# Patient Record
Sex: Female | Born: 1950 | Race: White | Hispanic: No | Marital: Married | Smoking: Never smoker
Health system: Southern US, Community
[De-identification: ages and names within clinical notes are randomized; demographics above are authoritative.]

## PROBLEM LIST (undated history)

## (undated) DIAGNOSIS — R87619 Unspecified abnormal cytological findings in specimens from cervix uteri: Secondary | ICD-10-CM

## (undated) DIAGNOSIS — K922 Gastrointestinal hemorrhage, unspecified: Secondary | ICD-10-CM

## (undated) DIAGNOSIS — I10 Essential (primary) hypertension: Secondary | ICD-10-CM

## (undated) DIAGNOSIS — R011 Cardiac murmur, unspecified: Secondary | ICD-10-CM

## (undated) DIAGNOSIS — R7303 Prediabetes: Secondary | ICD-10-CM

## (undated) DIAGNOSIS — J302 Other seasonal allergic rhinitis: Secondary | ICD-10-CM

## (undated) DIAGNOSIS — E559 Vitamin D deficiency, unspecified: Secondary | ICD-10-CM

## (undated) DIAGNOSIS — T7840XA Allergy, unspecified, initial encounter: Secondary | ICD-10-CM

## (undated) DIAGNOSIS — E785 Hyperlipidemia, unspecified: Secondary | ICD-10-CM

## (undated) HISTORY — DX: Unspecified abnormal cytological findings in specimens from cervix uteri: R87.619

## (undated) HISTORY — DX: Cardiac murmur, unspecified: R01.1

## (undated) HISTORY — DX: Other seasonal allergic rhinitis: J30.2

## (undated) HISTORY — PX: CRYOTHERAPY: SHX1416

## (undated) HISTORY — DX: Essential (primary) hypertension: I10

## (undated) HISTORY — DX: Gastrointestinal hemorrhage, unspecified: K92.2

## (undated) HISTORY — DX: Allergy, unspecified, initial encounter: T78.40XA

## (undated) HISTORY — DX: Vitamin D deficiency, unspecified: E55.9

## (undated) HISTORY — DX: Prediabetes: R73.03

## (undated) HISTORY — PX: TUBAL LIGATION: SHX77

## (undated) HISTORY — PX: CERVIX LESION DESTRUCTION: SHX591

## (undated) HISTORY — PX: COLONOSCOPY: SHX174

## (undated) HISTORY — PX: JOINT REPLACEMENT: SHX530

## (undated) HISTORY — DX: Hyperlipidemia, unspecified: E78.5

---
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
## 1997-12-03 ENCOUNTER — Other Ambulatory Visit: Admission: RE | Admit: 1997-12-03 | Discharge: 1997-12-03 | Payer: Self-pay | Admitting: Gynecology

## 1998-12-08 ENCOUNTER — Other Ambulatory Visit: Admission: RE | Admit: 1998-12-08 | Discharge: 1998-12-08 | Payer: Self-pay | Admitting: Gynecology

## 2000-01-25 ENCOUNTER — Other Ambulatory Visit: Admission: RE | Admit: 2000-01-25 | Discharge: 2000-01-25 | Payer: Self-pay | Admitting: Gynecology

## 2001-04-13 ENCOUNTER — Encounter: Admission: RE | Admit: 2001-04-13 | Discharge: 2001-04-13 | Payer: Self-pay | Admitting: Obstetrics and Gynecology

## 2001-04-13 ENCOUNTER — Other Ambulatory Visit: Admission: RE | Admit: 2001-04-13 | Discharge: 2001-04-13 | Payer: Self-pay | Admitting: Obstetrics and Gynecology

## 2001-04-13 ENCOUNTER — Encounter: Payer: Self-pay | Admitting: Obstetrics and Gynecology

## 2002-04-19 ENCOUNTER — Encounter: Payer: Self-pay | Admitting: Obstetrics and Gynecology

## 2002-04-19 ENCOUNTER — Other Ambulatory Visit: Admission: RE | Admit: 2002-04-19 | Discharge: 2002-04-19 | Payer: Self-pay | Admitting: Obstetrics and Gynecology

## 2002-04-19 ENCOUNTER — Ambulatory Visit (HOSPITAL_COMMUNITY): Admission: RE | Admit: 2002-04-19 | Discharge: 2002-04-19 | Payer: Self-pay | Admitting: Obstetrics and Gynecology

## 2002-07-09 ENCOUNTER — Encounter (HOSPITAL_COMMUNITY): Admission: RE | Admit: 2002-07-09 | Discharge: 2002-10-07 | Payer: Self-pay | Admitting: Family Medicine

## 2002-07-10 ENCOUNTER — Encounter: Payer: Self-pay | Admitting: Family Medicine

## 2003-05-06 ENCOUNTER — Other Ambulatory Visit: Admission: RE | Admit: 2003-05-06 | Discharge: 2003-05-06 | Payer: Self-pay | Admitting: Obstetrics and Gynecology

## 2003-05-06 ENCOUNTER — Ambulatory Visit (HOSPITAL_COMMUNITY): Admission: RE | Admit: 2003-05-06 | Discharge: 2003-05-06 | Payer: Self-pay | Admitting: Obstetrics and Gynecology

## 2004-06-03 ENCOUNTER — Ambulatory Visit (HOSPITAL_COMMUNITY): Admission: RE | Admit: 2004-06-03 | Discharge: 2004-06-03 | Payer: Self-pay | Admitting: Obstetrics and Gynecology

## 2004-06-03 ENCOUNTER — Other Ambulatory Visit: Admission: RE | Admit: 2004-06-03 | Discharge: 2004-06-03 | Payer: Self-pay | Admitting: *Deleted

## 2005-09-03 ENCOUNTER — Other Ambulatory Visit: Admission: RE | Admit: 2005-09-03 | Discharge: 2005-09-03 | Payer: Self-pay | Admitting: Obstetrics and Gynecology

## 2005-09-03 ENCOUNTER — Ambulatory Visit (HOSPITAL_COMMUNITY): Admission: RE | Admit: 2005-09-03 | Discharge: 2005-09-03 | Payer: Self-pay | Admitting: Obstetrics and Gynecology

## 2005-10-12 ENCOUNTER — Ambulatory Visit: Payer: Self-pay | Admitting: Gastroenterology

## 2005-11-04 ENCOUNTER — Encounter (INDEPENDENT_AMBULATORY_CARE_PROVIDER_SITE_OTHER): Payer: Self-pay | Admitting: Specialist

## 2005-11-04 ENCOUNTER — Ambulatory Visit: Payer: Self-pay | Admitting: Gastroenterology

## 2006-10-12 ENCOUNTER — Other Ambulatory Visit: Admission: RE | Admit: 2006-10-12 | Discharge: 2006-10-12 | Payer: Self-pay | Admitting: Obstetrics and Gynecology

## 2007-10-12 ENCOUNTER — Encounter: Admission: RE | Admit: 2007-10-12 | Discharge: 2007-10-12 | Payer: Self-pay | Admitting: Obstetrics and Gynecology

## 2007-10-13 ENCOUNTER — Other Ambulatory Visit: Admission: RE | Admit: 2007-10-13 | Discharge: 2007-10-13 | Payer: Self-pay | Admitting: Obstetrics and Gynecology

## 2007-10-13 ENCOUNTER — Encounter: Payer: Self-pay | Admitting: Obstetrics and Gynecology

## 2009-08-01 ENCOUNTER — Encounter: Admission: RE | Admit: 2009-08-01 | Discharge: 2009-08-01 | Payer: Self-pay | Admitting: Internal Medicine

## 2010-02-09 ENCOUNTER — Encounter: Payer: Self-pay | Admitting: Obstetrics and Gynecology

## 2010-09-30 ENCOUNTER — Other Ambulatory Visit: Payer: Self-pay | Admitting: Obstetrics and Gynecology

## 2010-09-30 ENCOUNTER — Other Ambulatory Visit: Payer: Self-pay

## 2010-09-30 DIAGNOSIS — Z1231 Encounter for screening mammogram for malignant neoplasm of breast: Secondary | ICD-10-CM

## 2010-10-14 ENCOUNTER — Ambulatory Visit
Admission: RE | Admit: 2010-10-14 | Discharge: 2010-10-14 | Disposition: A | Discharge status: Home / Self Care | Payer: BC Managed Care – PPO | Source: Ambulatory Visit | Attending: Obstetrics and Gynecology | Admitting: Obstetrics and Gynecology

## 2010-10-14 DIAGNOSIS — Z1231 Encounter for screening mammogram for malignant neoplasm of breast: Secondary | ICD-10-CM

## 2010-12-09 ENCOUNTER — Encounter: Payer: Self-pay | Admitting: Internal Medicine

## 2010-12-24 ENCOUNTER — Encounter: Payer: Self-pay | Admitting: Internal Medicine

## 2011-01-20 ENCOUNTER — Encounter: Payer: BC Managed Care – PPO | Admitting: Internal Medicine

## 2011-02-15 ENCOUNTER — Other Ambulatory Visit: Payer: BC Managed Care – PPO | Admitting: Internal Medicine

## 2011-03-09 ENCOUNTER — Encounter: Payer: Self-pay | Admitting: Internal Medicine

## 2011-03-09 ENCOUNTER — Ambulatory Visit (AMBULATORY_SURGERY_CENTER): Payer: BC Managed Care – PPO | Admitting: *Deleted

## 2011-03-09 VITALS — Ht 68.0 in | Wt 152.3 lb

## 2011-03-09 DIAGNOSIS — Z1211 Encounter for screening for malignant neoplasm of colon: Secondary | ICD-10-CM

## 2011-03-09 MED ORDER — PEG-KCL-NACL-NASULF-NA ASC-C 100 G PO SOLR: ORAL | Status: DC

## 2011-03-23 ENCOUNTER — Encounter: Payer: Self-pay | Admitting: Internal Medicine

## 2011-03-23 ENCOUNTER — Ambulatory Visit (AMBULATORY_SURGERY_CENTER): Payer: BC Managed Care – PPO | Admitting: Internal Medicine

## 2011-03-23 VITALS — BP 137/70 | HR 56 | Temp 97.8°F | Resp 14 | Ht 68.0 in | Wt 152.0 lb

## 2011-03-23 DIAGNOSIS — K648 Other hemorrhoids: Secondary | ICD-10-CM

## 2011-03-23 DIAGNOSIS — Z1211 Encounter for screening for malignant neoplasm of colon: Secondary | ICD-10-CM

## 2011-03-23 DIAGNOSIS — Z86010 Personal history of colonic polyps: Secondary | ICD-10-CM

## 2011-03-23 DIAGNOSIS — D126 Benign neoplasm of colon, unspecified: Secondary | ICD-10-CM

## 2011-03-23 DIAGNOSIS — D129 Benign neoplasm of anus and anal canal: Secondary | ICD-10-CM

## 2011-03-23 DIAGNOSIS — D128 Benign neoplasm of rectum: Secondary | ICD-10-CM

## 2011-03-23 LAB — HM COLONOSCOPY

## 2011-03-23 MED ORDER — SODIUM CHLORIDE 0.9 % IV SOLN: 500.0000 mL | INTRAVENOUS | Status: DC

## 2011-03-23 NOTE — Progress Notes (Signed)
IV AT FLUSH RATE, SKIN WARN DRY PINK, RESP DEEP EVEN.

## 2011-03-23 NOTE — Progress Notes (Signed)
Patient did not have preoperative order for IV antibiotic SSI prophylaxis. (G8918)  Patient did not experience any of the following events: a burn prior to discharge; a fall within the facility; wrong site/side/patient/procedure/implant event; or a hospital transfer or hospital admission upon discharge from the facility. (G8907)  

## 2011-03-23 NOTE — Op Note (Signed)
[facility] [address] [place], [place]  [address]  COLONOSCOPY PROCEDURE REPORT  PATIENT:  [name], [name]  MR#:  [id number] BIRTHDATE:  1950-03-21, 60 yrs. old  GENDER:  female ENDOSCOPIST:  Iva Boop, MD, [facility]  PROCEDURE DATE:  03/23/2011 PROCEDURE:  Colonoscopy with snare polypectomy ASA CLASS:  Class II INDICATIONS:  surveillance and high-risk screening, history of pre-cancerous (adenomatous) colon polyps, family history of colon cancer (sister age 55) diminutive adenoma removed from right colon 2004 and 7 mm right colon hyperplastic polyp removed 2007 MEDICATIONS:   These medications were titrated to patient response per physician's verbal order, Fentanyl 75 mcg IV, Versed 6 mg IV  DESCRIPTION OF PROCEDURE:   After the risks benefits and alternatives of the procedure were thoroughly explained, informed consent was obtained.  Digital rectal exam was performed and revealed no abnormalities.   The LB [id number] endoscope was introduced through the anus and advanced to the cecum, which was identified by both the appendix and ileocecal valve, without limitations.  The quality of the prep was excellent, using MoviPrep.  The instrument was then slowly withdrawn as the colon was fully examined. <<PROCEDUREIMAGES>>  FINDINGS:  Two polyps were found. Diminutive cecal and rectal polyps (3-51mm) cold snared and sent to pathology.  Mild diverticulosis was found in the sigmoid colon.  This was otherwise a normal examination of the colon.   Retroflexed views in the rectum revealed internal hemorrhoids.    The time to cecum = 5:27 minutes. The scope was then withdrawn in 11:38 minutes from the cecum and the procedure completed. COMPLICATIONS:  None ENDOSCOPIC IMPRESSION: 1) Two diminutive polyps removed 2) Mild diverticulosis in the sigmoid colon 3) Internal hemorrhoids 4) Otherwise normal examination, excellent prep 5) personal history of polyps and a  family history of colon cancer   REPEAT EXAM:  In for Colonoscopy, pending biopsy results. Likely repeat in 5 years.  Iva Boop, MD, [facility]  CC:  The Patient and Lucky Cowboy, MD  n. Rosalie Doctor:   Iva Boop at 03/23/2011 11:31 AM  Vickii Chafe, [phone]

## 2011-03-23 NOTE — Patient Instructions (Signed)
YOU HAD AN ENDOSCOPIC PROCEDURE TODAY AT THE [facility]: Refer to the procedure report that was given to you for any specific questions about what was found during the examination.  If the procedure report does not answer your questions, please call your gastroenterologist to clarify.  If you requested that your care partner not be given the details of your procedure findings, then the procedure report has been included in a sealed envelope for you to review at your convenience later.  YOU SHOULD EXPECT: Some feelings of bloating in the abdomen. Passage of more gas than usual.  Walking can help get rid of the air that was put into your GI tract during the procedure and reduce the bloating. If you had a lower endoscopy (such as a colonoscopy or flexible sigmoidoscopy) you may notice spotting of blood in your stool or on the toilet paper. If you underwent a bowel prep for your procedure, then you may not have a normal bowel movement for a few days.  DIET: Your first meal following the procedure should be a light meal and then it is ok to progress to your normal diet.  A half-sandwich or bowl of soup is an example of a good first meal.  Heavy or fried foods are harder to digest and may make you feel nauseous or bloated.  Likewise meals heavy in dairy and vegetables can cause extra gas to form and this can also increase the bloating.  Drink plenty of fluids but you should avoid alcoholic beverages for 24 hours.  ACTIVITY: Your care partner should take you home directly after the procedure.  You should plan to take it easy, moving slowly for the rest of the day.  You can resume normal activity the day after the procedure however you should NOT DRIVE or use heavy machinery for 24 hours (because of the sedation medicines used during the test).    SYMPTOMS TO REPORT IMMEDIATELY: A gastroenterologist can be reached at any hour.  During normal business hours, 8:30 AM to 5:00 PM Monday through Friday,  call [phone].  After hours and on weekends, please call the GI answering service at [phone] who will take a message and have the physician on call contact you.   Following lower endoscopy (colonoscopy or flexible sigmoidoscopy):  Excessive amounts of blood in the stool  Significant tenderness or worsening of abdominal pains  Swelling of the abdomen that is new, acute  Fever of 100F or higher    FOLLOW UP: If any biopsies were taken you will be contacted by phone or by letter within the next 1-3 weeks.  Call your gastroenterologist if you have not heard about the biopsies in 3 weeks.  Our staff will call the home number listed on your records the next business day following your procedure to check on you and address any questions or concerns that you may have at that time regarding the information given to you following your procedure. This is a courtesy call and so if there is no answer at the home number and we have not heard from you through the emergency physician on call, we will assume that you have returned to your regular daily activities without incident.  SIGNATURES/CONFIDENTIALITY: You and/or your care partner have signed paperwork which will be entered into your electronic medical record.  These signatures attest to the fact that that the information above on your After Visit Summary has been reviewed and is understood.  Full responsibility of the confidentiality   of this discharge information lies with you and/or your care-partner.     

## 2011-03-24 ENCOUNTER — Telehealth: Payer: Self-pay | Admitting: *Deleted

## 2011-03-24 NOTE — Telephone Encounter (Signed)
  Follow up Call-  Call back number 03/23/2011  Post procedure Call Back phone  # [phone]  Permission to leave phone message Yes     No answer, message left.

## 2011-03-30 ENCOUNTER — Encounter: Payer: Self-pay | Admitting: Internal Medicine

## 2011-03-30 NOTE — Progress Notes (Signed)
Quick Note:  Serrated adenoma and tubular adenoma and fam hx crca Repeat colonoscopy 3 years - approx 03/2014 ______

## 2011-10-14 ENCOUNTER — Other Ambulatory Visit: Payer: Self-pay | Admitting: Obstetrics and Gynecology

## 2011-10-14 DIAGNOSIS — Z1231 Encounter for screening mammogram for malignant neoplasm of breast: Secondary | ICD-10-CM

## 2011-11-16 ENCOUNTER — Ambulatory Visit
Admission: RE | Admit: 2011-11-16 | Discharge: 2011-11-16 | Disposition: A | Discharge status: Home / Self Care | Payer: BC Managed Care – PPO | Source: Ambulatory Visit | Attending: Obstetrics and Gynecology | Admitting: Obstetrics and Gynecology

## 2011-11-16 DIAGNOSIS — Z1231 Encounter for screening mammogram for malignant neoplasm of breast: Secondary | ICD-10-CM

## 2012-10-25 ENCOUNTER — Other Ambulatory Visit: Payer: Self-pay

## 2012-10-25 DIAGNOSIS — Z1231 Encounter for screening mammogram for malignant neoplasm of breast: Secondary | ICD-10-CM

## 2012-11-09 ENCOUNTER — Encounter: Payer: Self-pay | Admitting: Certified Nurse Midwife

## 2012-11-16 ENCOUNTER — Ambulatory Visit (INDEPENDENT_AMBULATORY_CARE_PROVIDER_SITE_OTHER): Payer: BC Managed Care – PPO | Admitting: Certified Nurse Midwife

## 2012-11-16 ENCOUNTER — Encounter: Payer: Self-pay | Admitting: Certified Nurse Midwife

## 2012-11-16 VITALS — BP 112/70 | HR 64 | Resp 18 | Ht 68.75 in | Wt 147.0 lb

## 2012-11-16 DIAGNOSIS — Z01419 Encounter for gynecological examination (general) (routine) without abnormal findings: Secondary | ICD-10-CM

## 2012-11-16 NOTE — Progress Notes (Signed)
62 y.o. G6P2002 Married Caucasian Fe here for annual exam. Menopausal no HRT. Patient denies vaginal bleeding or dryness. Patient sees PCP for aex and hypertension management, stable with medication.    Patient's last menstrual period was 11/17/2002.          Sexually active: yes  The current method of family planning is status post hysterectomy.    Exercising: yes  walk 3x/wk Smoker:  no  Health Maintenance: Pap:  11/16/11 NEG HR HPV MMG:  10/13 Scheduled for 11/20/12 Colonoscopy:  2011 5 years BMD:   none TDaP:  07/10/12 Labs: PCP   reports that she has never smoked. She has never used smokeless tobacco. She reports that she drinks about 1.5 ounces of alcohol per week. She reports that she does not use illicit drugs.  Past Medical History  Diagnosis Date  . Seasonal allergies   . Heart murmur   . Hyperlipidemia     Past Surgical History  Procedure Laterality Date  . Cardiac surgery  1958    at age 45 for hole in heart  . Colonoscopy      polyps    Current Outpatient Prescriptions  Medication Sig Dispense Refill  . acetaminophen (TYLENOL) 500 MG tablet Take 500 mg by mouth as needed for pain.      Marland Kitchen aspirin 81 MG tablet Take 81 mg by mouth daily.      . bisoprolol-hydrochlorothiazide (ZIAC) 5-6.25 MG per tablet Take 1 tablet by mouth daily.      . Calcium Carbonate-Vit D-Min (CALCIUM 1200 PO) Take by mouth daily.      . Cholecalciferol (VITAMIN D PO) Take 1,200 Int'l Units by mouth daily.      . fish oil-omega-3 fatty acids 1000 MG capsule Take 1 g by mouth 3 (three) times daily.      . Flaxseed, Linseed, (FLAXSEED OIL PO) Take by mouth 3 (three) times daily.      . Multiple Vitamin (MULTIVITAMIN) tablet Take 1 tablet by mouth daily.       No current facility-administered medications for this visit.    Family History  Problem Relation Age of Onset  . Colon cancer Sister 17  . Diabetes Sister   . Stomach cancer Neg Hx   . Diabetes Father   . Diabetes Brother   .  Diabetes Sister     ROS:  Pertinent items are noted in HPI.  Otherwise, a comprehensive ROS was negative.  Exam:   BP 112/70  Pulse 64  Resp 18  Ht 5' 8.75" (1.746 m)  Wt 147 lb (66.679 kg)  BMI 21.87 kg/m2  LMP 11/17/2002 Height: 5' 8.75" (174.6 cm)  Ht Readings from Last 3 Encounters:  11/16/12 5' 8.75" (1.746 m)  03/23/11 5\' 8"  (1.727 m)  03/09/11 5\' 8"  (1.727 m)    General appearance: alert, cooperative and appears stated age Head: Normocephalic, without obvious abnormality, atraumatic Neck: no adenopathy, supple, symmetrical, trachea midline and thyroid normal to inspection and palpation Lungs: clear to auscultation bilaterally Breasts: normal appearance, no masses or tenderness, No nipple retraction or dimpling, No nipple discharge or bleeding, No axillary or supraclavicular adenopathy Heart: regular rate and rhythm Abdomen: soft, non-tender; no masses,  no organomegaly Extremities: extremities normal, atraumatic, no cyanosis or edema Skin: Skin color, texture, turgor normal. No rashes or lesions Lymph nodes: Cervical, supraclavicular, and axillary nodes normal. No abnormal inguinal nodes palpated Neurologic: Grossly normal   Pelvic: External genitalia:  no lesions  Urethra:  normal appearing urethra with no masses, tenderness or lesions              Bartholin's and Skene's: normal                 Vagina: normal appearing vagina with normal color and discharge, no lesions              Cervix: normal, non tender              Pap taken: no Bimanual Exam:  Uterus:  normal size, contour, position, consistency, mobility, non-tender and anteverted              Adnexa: normal adnexa and no mass, fullness, tenderness               Rectovaginal: Confirms               Anus:  normal sphincter tone, no lesions  A:  Well Woman with normal exam  Menopausal no HRT  Hypertension stable medication  P:   Reviewed health and wellness pertinent  Patient aware of need  to evaluate if vaginal bleeding  Continue follow up as indicated  Pap smear as per guidelines   Mammogram yearly pap smear not taken today  counseled on breast self exam, mammography screening, adequate intake of calcium and vitamin D, diet and exercise  return annually or prn  An After Visit Summary was printed and given to the patient.

## 2012-11-16 NOTE — Patient Instructions (Signed)

## 2012-11-17 NOTE — Progress Notes (Signed)
Note reviewed, agree with plan.  Marni Franzoni, MD  

## 2012-11-20 ENCOUNTER — Ambulatory Visit
Admission: RE | Admit: 2012-11-20 | Discharge: 2012-11-20 | Disposition: A | Discharge status: Home / Self Care | Payer: BC Managed Care – PPO | Source: Ambulatory Visit

## 2012-11-20 DIAGNOSIS — Z1231 Encounter for screening mammogram for malignant neoplasm of breast: Secondary | ICD-10-CM

## 2012-11-20 LAB — HM MAMMOGRAPHY: HM Mammogram: NORMAL

## 2013-01-02 ENCOUNTER — Encounter: Payer: Self-pay | Admitting: Internal Medicine

## 2013-01-02 DIAGNOSIS — E782 Mixed hyperlipidemia: Secondary | ICD-10-CM | POA: Insufficient documentation

## 2013-01-02 DIAGNOSIS — R7303 Prediabetes: Secondary | ICD-10-CM | POA: Insufficient documentation

## 2013-01-02 DIAGNOSIS — E559 Vitamin D deficiency, unspecified: Secondary | ICD-10-CM | POA: Insufficient documentation

## 2013-01-02 DIAGNOSIS — I10 Essential (primary) hypertension: Secondary | ICD-10-CM | POA: Insufficient documentation

## 2013-01-02 DIAGNOSIS — J302 Other seasonal allergic rhinitis: Secondary | ICD-10-CM | POA: Insufficient documentation

## 2013-01-02 NOTE — Patient Instructions (Signed)

## 2013-01-02 NOTE — Progress Notes (Signed)
Patient ID: [name] [name], female   DOB: 1950-03-18, 62 y.o.   MRN: [id number]   This very nice 62 yo MWF presents for complete physical.  Patient has been followed for HTN, Prediabetes, Hyperlipidemia, and Vitamin D Deficiency.   Patient's BP has been controlled at home. Today's BP is 106/60. Patient denies any cardiac symptoms as chest pain, palpitations, shortness of breath, dizziness or ankle swelling.   Patient's hyperlipidemia is controlled with diet and medications. Patient denies myalgias or other medication SE's. Last cholesterol last visit was 171, triglycerides 148, HDL 54 and LDL  91 in September - all at goal.     Patient has prediabetes/insulin resistance with last A1c  5.7% in June (was 6.0% in 2010). Patient denies reactive hypoglycemic symptoms, visual blurring, diabetic polys, or paresthesias.     Finally, patient has history of Vitamin D Deficiency with last vitamin D 84 (was 24 in 2008)     Medication Sig Dispense Refill  . acetaminophen (TYLENOL) 500 MG tablet Take 500 mg by mouth as needed for pain.      Marland Kitchen aspirin 81 MG tablet Take 81 mg by mouth every other day.       . bisoprolol-hydrochlorothiazide (ZIAC) 5-6.25 MG per tablet Take 1 tablet by mouth daily.      . fish oil-omega-3 fatty acids 1000 MG capsule Take 1 g by mouth 3 (three) times daily.      . Flaxseed, Linseed, (FLAXSEED OIL PO) Take by mouth 3 (three) times daily.      . Multiple Vitamin (MULTIVITAMIN) tablet Take 1 tablet by mouth daily.        Allergies  Allergen Reactions  . Amoxil [Amoxicillin] Rash  . Ampicillin Rash    Past Medical History  Diagnosis Date  . Heart murmur   . Hyperlipidemia   . Seasonal allergies   . Vitamin D deficiency   . Prediabetes   . Hypertension     Past Surgical History  Procedure Laterality Date  . Cardiac surgery  1958    at age 58 for hole in heart  . Colonoscopy      polyps    Family History  Problem Relation Age of Onset  . Colon cancer Sister 66   . Diabetes Sister   . Stomach cancer Neg Hx   . Diabetes Father   . Diabetes Brother   . Diabetes Sister     History  Substance Use Topics  . Smoking status: Never Smoker   . Smokeless tobacco: Never Used  . Alcohol Use: 1.5 oz/week    3 drink(s) per week     Comment: occasional    ROS Constitutional: Denies fever, chills, weight loss/gain, headaches, insomnia, fatigue, night sweats, and change in appetite. Eyes: Denies redness, blurred vision, diplopia, discharge, itchy, watery eyes.  ENT: Denies discharge, congestion, post nasal drip, epistaxis, sore throat, earache, hearing loss, dental pain, Tinnitus, Vertigo, Sinus pain, snoring.  Cardio: Denies chest pain, palpitations, irregular heartbeat, syncope, dyspnea, diaphoresis, orthopnea, PND, claudication, edema Respiratory: denies cough, dyspnea, DOE, pleurisy, hoarseness, laryngitis, wheezing.  Gastrointestinal: Denies dysphagia, heartburn, reflux, water brash, pain, cramps, nausea, vomiting, bloating, diarrhea, constipation, hematemesis, melena, hematochezia, jaundice, hemorrhoids Genitourinary: Denies dysuria, frequency, urgency, nocturia, hesitancy, discharge, hematuria, flank pain Breast:Breast lumps, nipple discharge, bleeding.  Musculoskeletal: Denies arthralgia, myalgia, stiffness, Jt. Swelling, pain, limp, and strain/sprain. Skin: Denies puritis, rash, hives, warts, acne, eczema, changing in skin lesion Neuro: No weakness, tremor, incoordination, spasms, paresthesia, pain Psychiatric: Denies confusion, memory loss, sensory loss  Endocrine: Denies change in weight, skin, hair change, nocturia, and paresthesia, diabetic polys, visual blurring, hyper / hypo glycemic episodes.  Heme/Lymph: No excessive bleeding, bruising, enlarged lymph nodes.  BP: 106/60  Pulse: 52  Temp: 98.1 F (36.7 C)  Resp: 16    Estimated body mass index is 21.58 kg/(m^2) as calculated from the following:   Height as of 11/16/12: 5' 8.75" (1.746  m).   Weight as of this encounter: 145 lb (65.772 kg).  Physical Exam General Appearance: Well nourished, in no apparent distress. Eyes: PERRLA, EOMs, conjunctiva no swelling or erythema, normal fundi and vessels. Sinuses: No frontal/maxillary tenderness ENT/Mouth: EACs patent / TMs  nl. Nares clear without erythema, swelling, mucoid exudates. Oral hygiene is good. No erythema, swelling, or exudate. Tongue normal, non-obstructing. Tonsils not swollen or erythematous. Hearing normal.  Neck: Supple, thyroid normal. No bruits, nodes or JVD. Respiratory: Respiratory effort normal.  BS equal and clear bilateral without rales, rhonci, wheezing or stridor. Cardio: Heart sounds are normal with regular rate and rhythm and no murmurs, rubs or gallops. Peripheral pulses are normal and equal bilaterally without edema. No aortic or femoral bruits. Chest: symmetric with normal excursions and percussion. Abdomen: Flat, soft, with bowl sounds. Nontender, no guarding, rebound, hernias, masses, or organomegaly.  Lymphatics: Non tender without lymphadenopathy.   Musculoskeletal: Full ROM all peripheral extremities, joint stability, 5/5 strength, and normal gait. Skin: Warm and dry without rashes, lesions, cyanosis, clubbing or  ecchymosis.  Neuro: Cranial nerves intact, reflexes equal bilaterally. Normal muscle tone, no cerebellar symptoms. Sensation intact.  Pysch: Awake and oriented X 3, normal affect, Insight and Judgment appropriate.   Assessment and Plan  1. Hypertension  2. Hyperlipidemia 3. Pre Diabetes 4. Vitamin D Deficiency  Continue prudent diet as discussed, weight control, BP monitoring, regular exercise, and medications. Discussed med's effects and SE's. Screening labs and tests as requested with regular follow-up as recommended.

## 2013-01-03 ENCOUNTER — Encounter: Payer: Self-pay | Admitting: Internal Medicine

## 2013-01-03 ENCOUNTER — Other Ambulatory Visit: Payer: Self-pay | Admitting: Internal Medicine

## 2013-01-03 ENCOUNTER — Ambulatory Visit (INDEPENDENT_AMBULATORY_CARE_PROVIDER_SITE_OTHER): Payer: BC Managed Care – PPO | Admitting: Internal Medicine

## 2013-01-03 VITALS — BP 106/60 | HR 52 | Temp 98.1°F | Resp 16 | Wt 145.0 lb

## 2013-01-03 DIAGNOSIS — Z79899 Other long term (current) drug therapy: Secondary | ICD-10-CM

## 2013-01-03 DIAGNOSIS — E559 Vitamin D deficiency, unspecified: Secondary | ICD-10-CM

## 2013-01-03 DIAGNOSIS — E782 Mixed hyperlipidemia: Secondary | ICD-10-CM

## 2013-01-03 DIAGNOSIS — I10 Essential (primary) hypertension: Secondary | ICD-10-CM

## 2013-01-03 DIAGNOSIS — R7309 Other abnormal glucose: Secondary | ICD-10-CM

## 2013-01-03 DIAGNOSIS — Z23 Encounter for immunization: Secondary | ICD-10-CM

## 2013-01-03 LAB — CBC WITH DIFFERENTIAL/PLATELET
Basophils Absolute: 0 10*3/uL (ref 0.0–0.1)
Basophils Relative: 0 % (ref 0–1)
Eosinophils Absolute: 0.1 10*3/uL (ref 0.0–0.7)
Eosinophils Relative: 2 % (ref 0–5)
HCT: 38.2 % (ref 36.0–46.0)
Hemoglobin: 13.1 g/dL (ref 12.0–15.0)
Lymphocytes Relative: 40 % (ref 12–46)
Lymphs Abs: 1.9 10*3/uL (ref 0.7–4.0)
MCH: 30 pg (ref 26.0–34.0)
MCHC: 34.3 g/dL (ref 30.0–36.0)
MCV: 87.6 fL (ref 78.0–100.0)
Monocytes Absolute: 0.3 10*3/uL (ref 0.1–1.0)
Monocytes Relative: 7 % (ref 3–12)
Neutro Abs: 2.4 10*3/uL (ref 1.7–7.7)
Neutrophils Relative %: 51 % (ref 43–77)
Platelets: 269 10*3/uL (ref 150–400)
RBC: 4.36 MIL/uL (ref 3.87–5.11)
RDW: 13.1 % (ref 11.5–15.5)
WBC: 4.6 10*3/uL (ref 4.0–10.5)

## 2013-01-03 LAB — HEMOGLOBIN A1C
Hgb A1c MFr Bld: 5.9 % — ABNORMAL HIGH (ref <5.7)
Mean Plasma Glucose: 123 mg/dL — ABNORMAL HIGH (ref <117)

## 2013-01-04 LAB — BASIC METABOLIC PANEL WITH GFR
BUN: 16 mg/dL (ref 6–23)
CO2: 30 mEq/L (ref 19–32)
Calcium: 9.6 mg/dL (ref 8.4–10.5)
Chloride: 102 mEq/L (ref 96–112)
Creat: 0.62 mg/dL (ref 0.50–1.10)
GFR, Est African American: >89 mL/min
GFR, Est Non African American: >89 mL/min
Glucose, Bld: 92 mg/dL (ref 70–99)
Potassium: 4.2 mEq/L (ref 3.5–5.3)
Sodium: 139 mEq/L (ref 135–145)

## 2013-01-04 LAB — HEPATIC FUNCTION PANEL
ALT: 16 U/L (ref 0–35)
AST: 18 U/L (ref 0–37)
Albumin: 4.5 g/dL (ref 3.5–5.2)
Alkaline Phosphatase: 38 U/L — ABNORMAL LOW (ref 39–117)
Bilirubin, Direct: 0.2 mg/dL (ref 0.0–0.3)
Indirect Bilirubin: 0.6 mg/dL (ref 0.0–0.9)
Total Bilirubin: 0.8 mg/dL (ref 0.3–1.2)
Total Protein: 7.1 g/dL (ref 6.0–8.3)

## 2013-01-04 LAB — LIPID PANEL
Cholesterol: 152 mg/dL (ref 0–200)
HDL: 50 mg/dL (ref >39)
LDL Cholesterol: 85 mg/dL (ref 0–99)
Total CHOL/HDL Ratio: 3 Ratio
Triglycerides: 86 mg/dL (ref <150)
VLDL: 17 mg/dL (ref 0–40)

## 2013-01-04 LAB — MAGNESIUM: Magnesium: 1.8 mg/dL (ref 1.5–2.5)

## 2013-01-04 LAB — INSULIN, FASTING: Insulin fasting, serum: 6 u[IU]/mL (ref 3–28)

## 2013-01-04 LAB — TSH: TSH: 2.761 u[IU]/mL (ref 0.350–4.500)

## 2013-01-04 LAB — VITAMIN D 25 HYDROXY (VIT D DEFICIENCY, FRACTURES): Vit D, 25-Hydroxy: 86 ng/mL (ref 30–89)

## 2013-01-05 ENCOUNTER — Other Ambulatory Visit: Payer: Self-pay | Admitting: Internal Medicine

## 2013-03-12 ENCOUNTER — Encounter (INDEPENDENT_AMBULATORY_CARE_PROVIDER_SITE_OTHER): Payer: Self-pay

## 2013-03-12 ENCOUNTER — Encounter: Payer: Self-pay | Admitting: Emergency Medicine

## 2013-03-12 ENCOUNTER — Ambulatory Visit (INDEPENDENT_AMBULATORY_CARE_PROVIDER_SITE_OTHER): Payer: BC Managed Care – PPO | Admitting: Emergency Medicine

## 2013-03-12 VITALS — BP 124/70 | HR 60 | Temp 98.2°F | Resp 18 | Ht 68.5 in | Wt 143.0 lb

## 2013-03-12 DIAGNOSIS — J309 Allergic rhinitis, unspecified: Secondary | ICD-10-CM

## 2013-03-12 DIAGNOSIS — J069 Acute upper respiratory infection, unspecified: Secondary | ICD-10-CM

## 2013-03-12 MED ORDER — PREDNISONE 10 MG PO TABS: ORAL_TABLET | ORAL | Status: DC

## 2013-03-12 MED ORDER — ALBUTEROL SULFATE HFA 108 (90 BASE) MCG/ACT IN AERS: 2.0000 | INHALATION_SPRAY | Freq: Four times a day (QID) | RESPIRATORY_TRACT | Status: DC | PRN

## 2013-03-12 MED ORDER — AZITHROMYCIN 250 MG PO TABS: ORAL_TABLET | ORAL | Status: AC

## 2013-03-12 NOTE — Progress Notes (Signed)
   Subjective:    Patient ID: [name] [name], female    DOB: June 29, 1950, 63 y.o.   MRN: [id number]  HPI Comments: 63 yo female with increased chest congestion over 1 week. She has tried to take Mucinex/ water w/o relief. She notes symptoms started with a fever over 1 week ago.   Cough    Current Outpatient Prescriptions on File Prior to Visit  Medication Sig Dispense Refill  . acetaminophen (TYLENOL) 500 MG tablet Take 500 mg by mouth as needed for pain.      Marland Kitchen aspirin 81 MG tablet Take 81 mg by mouth every other day.       . bisoprolol-hydrochlorothiazide (ZIAC) 5-6.25 MG per tablet TAKE ONE TABLET BY MOUTH EVERY DAY IN THE MORNING FOR BLOOD PRESSURE FLUID  AND  PALPITATIONS  90 tablet  0  . calcium carbonate (OS-CAL) 600 MG TABS tablet Take 600 mg by mouth daily with breakfast.      . fish oil-omega-3 fatty acids 1000 MG capsule Take 1 g by mouth 3 (three) times daily.      . Flaxseed, Linseed, (FLAXSEED OIL PO) Take by mouth 3 (three) times daily.      . Multiple Vitamin (MULTIVITAMIN) tablet Take 1 tablet by mouth daily.       No current facility-administered medications on file prior to visit.   Allergies  Allergen Reactions  . Amoxil [Amoxicillin] Rash  . Ampicillin Rash   Past Medical History  Diagnosis Date  . Heart murmur   . Hyperlipidemia   . Seasonal allergies   . Vitamin D deficiency   . Prediabetes   . Hypertension      Review of Systems  Respiratory: Positive for cough.   BP 124/70  Pulse 60  Temp(Src) 98.2 F (36.8 C) (Temporal)  Resp 18  Ht 5' 8.5" (1.74 m)  Wt 143 lb (64.864 kg)  BMI 21.42 kg/m2  LMP 11/17/2002      Objective:   Physical Exam  Nursing note and vitals reviewed. Constitutional: She is oriented to person, place, and time. She appears well-developed and well-nourished.  HENT:  Head: Normocephalic and atraumatic.  Right Ear: External ear normal.  Left Ear: External ear normal.  Nose: Nose normal.  Mouth/Throat: Oropharynx is  clear and moist. No oropharyngeal exudate.  Cloudy TM's bilaterally   Eyes: Conjunctivae and EOM are normal.  Neck: Normal range of motion.  Cardiovascular: Normal rate, regular rhythm, normal heart sounds and intact distal pulses.   Pulmonary/Chest: Effort normal.  Tight/ congested cough   Musculoskeletal: Normal range of motion.  Lymphadenopathy:    She has no cervical adenopathy.  Neurological: She is alert and oriented to person, place, and time.  Skin: Skin is warm and dry.  Psychiatric: She has a normal mood and affect. Judgment normal.          Assessment & Plan:  URI/ Allergic rhinitis- Albuterol HFA AD, instructions on use given. Mucinex/ Allegra OTC, increase H2o, allergy hygiene explained. ZPAK, Pred DP 10 mg Both AD, Only start ZPAK if symptoms.

## 2013-03-12 NOTE — Patient Instructions (Signed)
Bronchitis Bronchitis is swelling (inflammation) of the air tubes leading to your lungs (bronchi). This causes mucus and a cough. If the swelling gets bad, you may have trouble breathing. HOME CARE   Rest.  Drink enough fluids to keep your pee (urine) clear or pale yellow (unless you have a condition where you have to watch how much you drink).  Only take medicine as told by your doctor. If you were given antibiotic medicines, finish them even if you start to feel better.  Avoid smoke, irritating chemicals, and strong smells. These make the problem worse. Quit smoking if you smoke. This helps your lungs heal faster.  Use a cool mist humidifier. Change the water in the humidifier every day. You can also sit in the bathroom with hot shower running for 5 10 minutes. Keep the door closed.  See your health care provider as told.  Wash your hands often. GET HELP IF: Your problems do not get better after 1 week. GET HELP RIGHT AWAY IF:   Your fever gets worse.  You have chills.  Your chest hurts.  Your problems breathing get worse.  You have blood in your mucus.  You pass out (faint).  You feel lightheaded.  You have a bad headache.  You throw up (vomit) again and again. MAKE SURE YOU:  Understand these instructions.  Will watch your condition.  Will get help right away if you are not doing well or get worse. Document Released: 06/23/2007 Document Revised: 10/25/2012 Document Reviewed: 08/29/2012 [facility] Patient Information 2014 [place], [place].  Allergic Rhinitis ALLEGRA OVER THE COUNTER Allergic rhinitis is when the mucous membranes in the nose respond to allergens. Allergens are particles in the air that cause your body to have an allergic reaction. This causes you to release allergic antibodies. Through a chain of events, these eventually cause you to release histamine into the blood stream. Although meant to protect the body, it is this release of histamine that  causes your discomfort, such as frequent sneezing, congestion, and an itchy, runny nose.  CAUSES  Seasonal allergic rhinitis (hay fever) is caused by pollen allergens that may come from grasses, trees, and weeds. Year-round allergic rhinitis (perennial allergic rhinitis) is caused by allergens such as house dust mites, pet dander, and mold spores.  SYMPTOMS   Nasal stuffiness (congestion).  Itchy, runny nose with sneezing and tearing of the eyes. DIAGNOSIS  Your health care provider can help you determine the allergen or allergens that trigger your symptoms. If you and your health care provider are unable to determine the allergen, skin or blood testing may be used. TREATMENT  Allergic Rhinitis does not have a cure, but it can be controlled by:  Medicines and allergy shots (immunotherapy).  Avoiding the allergen. Hay fever may often be treated with antihistamines in pill or nasal spray forms. Antihistamines block the effects of histamine. There are over-the-counter medicines that may help with nasal congestion and swelling around the eyes. Check with your health care provider before taking or giving this medicine.  If avoiding the allergen or the medicine prescribed do not work, there are many new medicines your health care provider can prescribe. Stronger medicine may be used if initial measures are ineffective. Desensitizing injections can be used if medicine and avoidance does not work. Desensitization is when a patient is given ongoing shots until the body becomes less sensitive to the allergen. Make sure you follow up with your health care provider if problems continue. HOME CARE INSTRUCTIONS It is  not possible to completely avoid allergens, but you can reduce your symptoms by taking steps to limit your exposure to them. It helps to know exactly what you are allergic to so that you can avoid your specific triggers. SEEK MEDICAL CARE IF:   You have a fever.  You develop a cough that does  not stop easily (persistent).  You have shortness of breath.  You start wheezing.  Symptoms interfere with normal daily activities. Document Released: 09/29/2000 Document Revised: 10/25/2012 Document Reviewed: 09/11/2012 [facility] Patient Information 2014 [place].

## 2013-04-03 ENCOUNTER — Ambulatory Visit: Payer: Self-pay | Admitting: Emergency Medicine

## 2013-04-04 ENCOUNTER — Other Ambulatory Visit: Payer: Self-pay | Admitting: Emergency Medicine

## 2013-05-21 ENCOUNTER — Ambulatory Visit (INDEPENDENT_AMBULATORY_CARE_PROVIDER_SITE_OTHER): Payer: BC Managed Care – PPO | Admitting: Physician Assistant

## 2013-05-21 ENCOUNTER — Encounter: Payer: Self-pay | Admitting: Physician Assistant

## 2013-05-21 VITALS — BP 102/60 | HR 60 | Temp 97.9°F | Resp 16 | Ht 69.0 in | Wt 143.0 lb

## 2013-05-21 DIAGNOSIS — M545 Low back pain, unspecified: Secondary | ICD-10-CM

## 2013-05-21 MED ORDER — DEXAMETHASONE SODIUM PHOSPHATE 10 MG/ML IJ SOLN
10.0000 mg | Freq: Once | INTRAMUSCULAR | Status: AC
  Administered 2013-05-21: 10 mg via INTRAMUSCULAR

## 2013-05-21 MED ORDER — MELOXICAM 15 MG PO TABS
ORAL_TABLET | ORAL | Status: DC
Start: 2013-05-21 — End: 2013-07-13

## 2013-05-21 NOTE — Progress Notes (Signed)
   Subjective:    Patient ID: [name] [name], female    DOB: 1950-05-15, 63 y.o.   MRN: [id number]  Back Pain This is a new problem. Episode onset: 3 days. The problem occurs constantly. The problem is unchanged. The pain is present in the lumbar spine and sacro-iliac. The quality of the pain is described as aching and shooting. Radiates to: to left hip. The pain is moderate. The symptoms are aggravated by lying down, sitting and twisting. Pertinent negatives include no abdominal pain, bladder incontinence, bowel incontinence, chest pain, dysuria, fever, headaches, leg pain, numbness, paresis, paresthesias, pelvic pain, perianal numbness, tingling, weakness or weight loss. She has tried NSAIDs for the symptoms. The treatment provided mild relief.      Review of Systems  Constitutional: Negative.  Negative for fever and weight loss.  HENT: Negative.   Respiratory: Negative.   Cardiovascular: Negative.  Negative for chest pain.  Gastrointestinal: Negative.  Negative for abdominal pain and bowel incontinence.  Genitourinary: Negative for bladder incontinence, dysuria, urgency, frequency, flank pain, decreased urine volume, vaginal bleeding, vaginal discharge, enuresis, genital sores, vaginal pain, menstrual problem and pelvic pain.  Musculoskeletal: Positive for back pain. Negative for arthralgias and gait problem.  Skin: Negative.  Negative for rash.  Neurological: Negative.  Negative for tingling, weakness, numbness, headaches and paresthesias.  Psychiatric/Behavioral: Negative.        Objective:   Physical Exam  Constitutional: She appears well-developed and well-nourished.  HENT:  Head: Normocephalic and atraumatic.  Cardiovascular: Normal rate and regular rhythm.   Pulmonary/Chest: Effort normal and breath sounds normal.  Abdominal: Soft. Bowel sounds are normal.  Musculoskeletal:  General Appearance:  No distress.  Patient is able to ambulate well.  Gait is not not  antalgic. Straight leg raising negative bilaterally for radicular symptoms. Sensory exam in the legs is normal.  Knee reflexes are normal and symmetric. Ankle reflexes are normal and symmetric Strength is normal and symmetric. There is paraspinal muscle spasm, + LSI pain.  There is not no midline tenderness.  ROM of spine with normal flexion, extension, lateral range of motion to the right and left, and rotation to the right and left.    Skin: Skin is warm and dry.      Assessment & Plan:  Left SI pain, negative straight leg, no bowel/bladder problems Injection- area cleaned with alcohol, Dexamethasone 10mg  and 1 CC lidocaine injected into left SI tolerated well with immediate relief.   Mobic, RICE, and exercise given If not better with refer to orthopedics.

## 2013-05-21 NOTE — Patient Instructions (Signed)
Back Exercises Back exercises help treat and prevent back injuries. The goal of back exercises is to increase the strength of your abdominal and back muscles and the flexibility of your back. These exercises should be started when you no longer have back pain. Back exercises include:  Pelvic Tilt. Lie on your back with your knees bent. Tilt your pelvis until the lower part of your back is against the floor. Hold this position 5 to 10 sec and repeat 5 to 10 times.  Knee to Chest. Pull first 1 knee up against your chest and hold for 20 to 30 seconds, repeat this with the other knee, and then both knees. This may be done with the other leg straight or bent, whichever feels better.  Sit-Ups or Curl-Ups. Bend your knees 90 degrees. Start with tilting your pelvis, and do a partial, slow sit-up, lifting your trunk only 30 to 45 degrees off the floor. Take at least 2 to 3 seconds for each sit-up. Do not do sit-ups with your knees out straight. If partial sit-ups are difficult, simply do the above but with only tightening your abdominal muscles and holding it as directed.  Hip-Lift. Lie on your back with your knees flexed 90 degrees. Push down with your feet and shoulders as you raise your hips a couple inches off the floor; hold for 10 seconds, repeat 5 to 10 times.  Back arches. Lie on your stomach, propping yourself up on bent elbows. Slowly press on your hands, causing an arch in your low back. Repeat 3 to 5 times. Any initial stiffness and discomfort should lessen with repetition over time.  Shoulder-Lifts. Lie face down with arms beside your body. Keep hips and torso pressed to floor as you slowly lift your head and shoulders off the floor. Do not overdo your exercises, especially in the beginning. Exercises may cause you some mild back discomfort which lasts for a few minutes; however, if the pain is more severe, or lasts for more than 15 minutes, do not continue exercises until you see your caregiver.  Improvement with exercise therapy for back problems is slow.  See your caregivers for assistance with developing a proper back exercise program. Document Released: 02/12/2004 Document Revised: 03/29/2011 Document Reviewed: 11/05/2010 [facility] Patient Information 2014 [place].  Sciatica with Rehab The sciatic nerve runs from the back down the leg and is responsible for sensation and control of the muscles in the back (posterior) side of the thigh, lower leg, and foot. Sciatica is a condition that is characterized by inflammation of this nerve.  SYMPTOMS   Signs of nerve damage, including numbness and/or weakness along the posterior side of the lower extremity.  Pain in the back of the thigh that may also travel down the leg.  Pain that worsens when sitting for long periods of time.  Occasionally, pain in the back or buttock. CAUSES  Inflammation of the sciatic nerve is the cause of sciatica. The inflammation is due to something irritating the nerve. Common sources of irritation include:  Sitting for long periods of time.  Direct trauma to the nerve.  Arthritis of the spine.  Herniated or ruptured disk.  Slipping of the vertebrae (spondylolithesis)  Pressure from soft tissues, such as muscles or ligament-like tissue (fascia). RISK INCREASES WITH:  Sports that place pressure or stress on the spine (football or weightlifting).  Poor strength and flexibility.  Failure to warm-up properly before activity.  Family history of low back pain or disk disorders.  Previous  back injury or surgery.  Poor body mechanics, especially when lifting, or poor posture. PREVENTION   Warm up and stretch properly before activity.  Maintain physical fitness:  Strength, flexibility, and endurance.  Cardiovascular fitness.  Learn and use proper technique, especially with posture and lifting. When possible, have coach correct improper technique.  Avoid activities that place stress  on the spine. PROGNOSIS If treated properly, then sciatica usually resolves within 6 weeks. However, occasionally surgery is necessary.  RELATED COMPLICATIONS   Permanent nerve damage, including pain, numbness, tingle, or weakness.  Chronic back pain.  Risks of surgery: infection, bleeding, nerve damage, or damage to surrounding tissues. TREATMENT Treatment initially involves resting from any activities that aggravate your symptoms. The use of ice and medication may help reduce pain and inflammation. The use of strengthening and stretching exercises may help reduce pain with activity. These exercises may be performed at home or with referral to a therapist. A therapist may recommend further treatments, such as transcutaneous electronic nerve stimulation (TENS) or ultrasound. Your caregiver may recommend corticosteroid injections to help reduce inflammation of the sciatic nerve. If symptoms persist despite non-surgical (conservative) treatment, then surgery may be recommended. MEDICATION  If pain medication is necessary, then nonsteroidal anti-inflammatory medications, such as aspirin and ibuprofen, or other minor pain relievers, such as acetaminophen, are often recommended.  Do not take pain medication for 7 days before surgery.  Prescription pain relievers may be given if deemed necessary by your caregiver. Use only as directed and only as much as you need.  Ointments applied to the skin may be helpful.  Corticosteroid injections may be given by your caregiver. These injections should be reserved for the most serious cases, because they may only be given a certain number of times. HEAT AND COLD  Cold treatment (icing) relieves pain and reduces inflammation. Cold treatment should be applied for 10 to 15 minutes every 2 to 3 hours for inflammation and pain and immediately after any activity that aggravates your symptoms. Use ice packs or massage the area with a piece of ice (ice  massage).  Heat treatment may be used prior to performing the stretching and strengthening activities prescribed by your caregiver, physical therapist, or athletic trainer. Use a heat pack or soak the injury in warm water. SEEK MEDICAL CARE IF:  Treatment seems to offer no benefit, or the condition worsens.  Any medications produce adverse side effects. EXERCISES  RANGE OF MOTION (ROM) AND STRETCHING EXERCISES - Sciatica Most people with sciatic will find that their symptoms worsen with either excessive bending forward (flexion) or arching at the low back (extension). The exercises which will help resolve your symptoms will focus on the opposite motion. Your physician, physical therapist or athletic trainer will help you determine which exercises will be most helpful to resolve your low back pain. Do not complete any exercises without first consulting with your clinician. Discontinue any exercises which worsen your symptoms until you speak to your clinician. If you have pain, numbness or tingling which travels down into your buttocks, leg or foot, the goal of the therapy is for these symptoms to move closer to your back and eventually resolve. Occasionally, these leg symptoms will get better, but your low back pain may worsen; this is typically an indication of progress in your rehabilitation. Be certain to be very alert to any changes in your symptoms and the activities in which you participated in the 24 hours prior to the change. Sharing this information with your clinician  will allow him/her to most efficiently treat your condition. These exercises may help you when beginning to rehabilitate your injury. Your symptoms may resolve with or without further involvement from your physician, physical therapist or athletic trainer. While completing these exercises, remember:   Restoring tissue flexibility helps normal motion to return to the joints. This allows healthier, less painful movement and  activity.  An effective stretch should be held for at least 30 seconds.  A stretch should never be painful. You should only feel a gentle lengthening or release in the stretched tissue. FLEXION RANGE OF MOTION AND STRETCHING EXERCISES: STRETCH  Flexion, Single Knee to Chest   Lie on a firm bed or floor with both legs extended in front of you.  Keeping one leg in contact with the floor, bring your opposite knee to your chest. Hold your leg in place by either grabbing behind your thigh or at your knee.  Pull until you feel a gentle stretch in your low back. Hold __________ seconds.  Slowly release your grasp and repeat the exercise with the opposite side. Repeat __________ times. Complete this exercise __________ times per day.  STRETCH  Flexion, Double Knee to Chest  Lie on a firm bed or floor with both legs extended in front of you.  Keeping one leg in contact with the floor, bring your opposite knee to your chest.  Tense your stomach muscles to support your back and then lift your other knee to your chest. Hold your legs in place by either grabbing behind your thighs or at your knees.  Pull both knees toward your chest until you feel a gentle stretch in your low back. Hold __________ seconds.  Tense your stomach muscles and slowly return one leg at a time to the floor. Repeat __________ times. Complete this exercise __________ times per day.  STRETCH  Low Trunk Rotation   Lie on a firm bed or floor. Keeping your legs in front of you, bend your knees so they are both pointed toward the ceiling and your feet are flat on the floor.  Extend your arms out to the side. This will stabilize your upper body by keeping your shoulders in contact with the floor.  Gently and slowly drop both knees together to one side until you feel a gentle stretch in your low back. Hold for __________ seconds.  Tense your stomach muscles to support your low back as you bring your knees back to the starting  position. Repeat the exercise to the other side. Repeat __________ times. Complete this exercise __________ times per day  EXTENSION RANGE OF MOTION AND FLEXIBILITY EXERCISES: STRETCH  Extension, Prone on Elbows  Lie on your stomach on the floor, a bed will be too soft. Place your palms about shoulder width apart and at the height of your head.  Place your elbows under your shoulders. If this is too painful, stack pillows under your chest.  Allow your body to relax so that your hips drop lower and make contact more completely with the floor.  Hold this position for __________ seconds.  Slowly return to lying flat on the floor. Repeat __________ times. Complete this exercise __________ times per day.  RANGE OF MOTION  Extension, Prone Press Ups  Lie on your stomach on the floor, a bed will be too soft. Place your palms about shoulder width apart and at the height of your head.  Keeping your back as relaxed as possible, slowly straighten your elbows while keeping your hips  on the floor. You may adjust the placement of your hands to maximize your comfort. As you gain motion, your hands will come more underneath your shoulders.  Hold this position __________ seconds.  Slowly return to lying flat on the floor. Repeat __________ times. Complete this exercise __________ times per day.  STRENGTHENING EXERCISES - Sciatica  These exercises may help you when beginning to rehabilitate your injury. These exercises should be done near your "sweet spot." This is the neutral, low-back arch, somewhere between fully rounded and fully arched, that is your least painful position. When performed in this safe range of motion, these exercises can be used for people who have either a flexion or extension based injury. These exercises may resolve your symptoms with or without further involvement from your physician, physical therapist or athletic trainer. While completing these exercises, remember:   Muscles can  gain both the endurance and the strength needed for everyday activities through controlled exercises.  Complete these exercises as instructed by your physician, physical therapist or athletic trainer. Progress with the resistance and repetition exercises only as your caregiver advises.  You may experience muscle soreness or fatigue, but the pain or discomfort you are trying to eliminate should never worsen during these exercises. If this pain does worsen, stop and make certain you are following the directions exactly. If the pain is still present after adjustments, discontinue the exercise until you can discuss the trouble with your clinician. STRENGTHENING Deep Abdominals, Pelvic Tilt   Lie on a firm bed or floor. Keeping your legs in front of you, bend your knees so they are both pointed toward the ceiling and your feet are flat on the floor.  Tense your lower abdominal muscles to press your low back into the floor. This motion will rotate your pelvis so that your tail bone is scooping upwards rather than pointing at your feet or into the floor.  With a gentle tension and even breathing, hold this position for __________ seconds. Repeat __________ times. Complete this exercise __________ times per day.  STRENGTHENING  Abdominals, Crunches   Lie on a firm bed or floor. Keeping your legs in front of you, bend your knees so they are both pointed toward the ceiling and your feet are flat on the floor. Cross your arms over your chest.  Slightly tip your chin down without bending your neck.  Tense your abdominals and slowly lift your trunk high enough to just clear your shoulder blades. Lifting higher can put excessive stress on the low back and does not further strengthen your abdominal muscles.  Control your return to the starting position. Repeat __________ times. Complete this exercise __________ times per day.  STRENGTHENING  Quadruped, Opposite UE/LE Lift  Assume a hands and knees position  on a firm surface. Keep your hands under your shoulders and your knees under your hips. You may place padding under your knees for comfort.  Find your neutral spine and gently tense your abdominal muscles so that you can maintain this position. Your shoulders and hips should form a rectangle that is parallel with the floor and is not twisted.  Keeping your trunk steady, lift your right hand no higher than your shoulder and then your left leg no higher than your hip. Make sure you are not holding your breath. Hold this position __________ seconds.  Continuing to keep your abdominal muscles tense and your back steady, slowly return to your starting position. Repeat with the opposite arm and leg. Repeat __________  times. Complete this exercise __________ times per day.  STRENGTHENING  Abdominals and Quadriceps, Straight Leg Raise   Lie on a firm bed or floor with both legs extended in front of you.  Keeping one leg in contact with the floor, bend the other knee so that your foot can rest flat on the floor.  Find your neutral spine, and tense your abdominal muscles to maintain your spinal position throughout the exercise.  Slowly lift your straight leg off the floor about 6 inches for a count of 15, making sure to not hold your breath.  Still keeping your neutral spine, slowly lower your leg all the way to the floor. Repeat this exercise with each leg __________ times. Complete this exercise __________ times per day. POSTURE AND BODY MECHANICS CONSIDERATIONS - Sciatica Keeping correct posture when sitting, standing or completing your activities will reduce the stress put on different body tissues, allowing injured tissues a chance to heal and limiting painful experiences. The following are general guidelines for improved posture. Your physician or physical therapist will provide you with any instructions specific to your needs. While reading these guidelines, remember:  The exercises prescribed by  your provider will help you have the flexibility and strength to maintain correct postures.  The correct posture provides the optimal environment for your joints to work. All of your joints have less wear and tear when properly supported by a spine with good posture. This means you will experience a healthier, less painful body.  Correct posture must be practiced with all of your activities, especially prolonged sitting and standing. Correct posture is as important when doing repetitive low-stress activities (typing) as it is when doing a single heavy-load activity (lifting). RESTING POSITIONS Consider which positions are most painful for you when choosing a resting position. If you have pain with flexion-based activities (sitting, bending, stooping, squatting), choose a position that allows you to rest in a less flexed posture. You would want to avoid curling into a fetal position on your side. If your pain worsens with extension-based activities (prolonged standing, working overhead), avoid resting in an extended position such as sleeping on your stomach. Most people will find more comfort when they rest with their spine in a more neutral position, neither too rounded nor too arched. Lying on a non-sagging bed on your side with a pillow between your knees, or on your back with a pillow under your knees will often provide some relief. Keep in mind, being in any one position for a prolonged period of time, no matter how correct your posture, can still lead to stiffness. PROPER SITTING POSTURE In order to minimize stress and discomfort on your spine, you must sit with correct posture Sitting with good posture should be effortless for a healthy body. Returning to good posture is a gradual process. Many people can work toward this most comfortably by using various supports until they have the flexibility and strength to maintain this posture on their own. When sitting with proper posture, your ears will fall  over your shoulders and your shoulders will fall over your hips. You should use the back of the chair to support your upper back. Your low back will be in a neutral position, just slightly arched. You may place a small pillow or folded towel at the base of your low back for support.  When working at a desk, create an environment that supports good, upright posture. Without extra support, muscles fatigue and lead to excessive strain on joints and  other tissues. Keep these recommendations in mind: CHAIR:   A chair should be able to slide under your desk when your back makes contact with the back of the chair. This allows you to work closely.  The chair's height should allow your eyes to be level with the upper part of your monitor and your hands to be slightly lower than your elbows. BODY POSITION  Your feet should make contact with the floor. If this is not possible, use a foot rest.  Keep your ears over your shoulders. This will reduce stress on your neck and low back. INCORRECT SITTING POSTURES   If you are feeling tired and unable to assume a healthy sitting posture, do not slouch or slump. This puts excessive strain on your back tissues, causing more damage and pain. Healthier options include:  Using more support, like a lumbar pillow.  Switching tasks to something that requires you to be upright or walking.  Talking a brief walk.  Lying down to rest in a neutral-spine position. PROLONGED STANDING WHILE SLIGHTLY LEANING FORWARD  When completing a task that requires you to lean forward while standing in one place for a long time, place either foot up on a stationary 2-4 inch high object to help maintain the best posture. When both feet are on the ground, the low back tends to lose its slight inward curve. If this curve flattens (or becomes too large), then the back and your other joints will experience too much stress, fatigue more quickly and can cause pain.  CORRECT STANDING  POSTURES Proper standing posture should be assumed with all daily activities, even if they only take a few moments, like when brushing your teeth. As in sitting, your ears should fall over your shoulders and your shoulders should fall over your hips. You should keep a slight tension in your abdominal muscles to brace your spine. Your tailbone should point down to the ground, not behind your body, resulting in an over-extended swayback posture.  INCORRECT STANDING POSTURES  Common incorrect standing postures include a forward head, locked knees and/or an excessive swayback. WALKING Walk with an upright posture. Your ears, shoulders and hips should all line-up. PROLONGED ACTIVITY IN A FLEXED POSITION When completing a task that requires you to bend forward at your waist or lean over a low surface, try to find a way to stabilize 3 of 4 of your limbs. You can place a hand or elbow on your thigh or rest a knee on the surface you are reaching across. This will provide you more stability so that your muscles do not fatigue as quickly. By keeping your knees relaxed, or slightly bent, you will also reduce stress across your low back. CORRECT LIFTING TECHNIQUES DO :   Assume a wide stance. This will provide you more stability and the opportunity to get as close as possible to the object which you are lifting.  Tense your abdominals to brace your spine; then bend at the knees and hips. Keeping your back locked in a neutral-spine position, lift using your leg muscles. Lift with your legs, keeping your back straight.  Test the weight of unknown objects before attempting to lift them.  Try to keep your elbows locked down at your sides in order get the best strength from your shoulders when carrying an object.  Always ask for help when lifting heavy or awkward objects. INCORRECT LIFTING TECHNIQUES DO NOT:   Lock your knees when lifting, even if it is a small object.  Bend and twist. Pivot at your feet or  move your feet when needing to change directions.  Assume that you cannot safely pick up a paperclip without proper posture. Document Released: 01/04/2005 Document Revised: 03/29/2011 Document Reviewed: 04/18/2008 [facility] Patient Information 2014 [place], [place].

## 2013-05-29 ENCOUNTER — Encounter: Payer: Self-pay | Admitting: Internal Medicine

## 2013-05-29 ENCOUNTER — Ambulatory Visit (INDEPENDENT_AMBULATORY_CARE_PROVIDER_SITE_OTHER): Payer: BC Managed Care – PPO | Admitting: Physician Assistant

## 2013-05-29 VITALS — BP 102/60 | HR 60 | Temp 97.9°F | Resp 16 | Wt 145.0 lb

## 2013-05-29 DIAGNOSIS — J0100 Acute maxillary sinusitis, unspecified: Secondary | ICD-10-CM

## 2013-05-29 MED ORDER — DEXAMETHASONE SODIUM PHOSPHATE 10 MG/ML IJ SOLN
10.0000 mg | Freq: Once | INTRAMUSCULAR | Status: AC
  Administered 2013-05-29: 10 mg via INTRAMUSCULAR

## 2013-05-29 MED ORDER — AZITHROMYCIN 250 MG PO TABS: ORAL_TABLET | ORAL | Status: DC

## 2013-05-29 NOTE — Progress Notes (Signed)
   Subjective:    Patient ID: [name] [name], female    DOB: 04/06/1950, 63 y.o.   MRN: [id number]  Sinus Problem This is a new problem. Episode onset: 4 days. The problem is unchanged. There has been no fever. Associated symptoms include congestion, coughing (worse at night), a hoarse voice, shortness of breath (with lying down at night), sinus pressure and sneezing. Pertinent negatives include no chills, diaphoresis, ear pain, headaches, neck pain, sore throat or swollen glands. Treatments tried: allegra, nyquil, mucinex. The treatment provided mild relief.    Review of Systems  Constitutional: Positive for fatigue. Negative for fever, chills and diaphoresis.  HENT: Positive for congestion, hoarse voice, rhinorrhea, sinus pressure and sneezing. Negative for dental problem, ear discharge, ear pain, nosebleeds, sore throat, trouble swallowing and voice change.   Respiratory: Positive for cough (worse at night) and shortness of breath (with lying down at night). Negative for chest tightness and wheezing.   Cardiovascular: Negative.   Gastrointestinal: Negative.   Genitourinary: Negative.   Musculoskeletal: Negative.  Negative for neck pain.  Neurological: Negative.  Negative for headaches.       Objective:   Physical Exam  Constitutional: She appears well-developed and well-nourished.  HENT:  Head: Normocephalic and atraumatic.  Right Ear: External ear normal.  Nose: Right sinus exhibits maxillary sinus tenderness. Right sinus exhibits no frontal sinus tenderness. Left sinus exhibits maxillary sinus tenderness. Left sinus exhibits no frontal sinus tenderness.  Eyes: Conjunctivae and EOM are normal.  Neck: Normal range of motion. Neck supple.  Cardiovascular: Normal rate, regular rhythm, normal heart sounds and intact distal pulses.   Pulmonary/Chest: Effort normal and breath sounds normal. No respiratory distress. She has no wheezes.  Abdominal: Soft. Bowel sounds are normal.   Lymphadenopathy:    She has cervical adenopathy.  Skin: Skin is warm and dry.      Assessment & Plan:  Acute maxillary sinusitis - Plan: azithromycin (ZITHROMAX) 250 MG tablet, dexamethasone (DECADRON) injection 10 mg Continue allegra, mucinex, increase fluids, if not improving 3 days get zpak

## 2013-05-29 NOTE — Patient Instructions (Signed)
Continue the allegra and mucinex, increase water.  If not improving/getting better in 3 days then get on the zpak.   The majority of colds are caused by viruses and do not require antibiotics. Please read the rest of this hand out to learn more about the common cold and what you can do to help yourself as well as help prevent the over use of antibiotics.   COMMON COLD SIGNS AND SYMPTOMS - The common cold usually causes nasal congestion, runny nose, and sneezing. A sore throat may be present on the first day but usually resolves quickly. If a cough occurs, it generally develops on about the fourth or fifth day of symptoms, typically when congestion and runny nose are resolving  COMMON COLD COMPLICATIONS - In most cases, colds do not cause serious illness or complications. Most colds last for three to seven days, although many people continue to have symptoms (coughing, sneezing, congestion) for up to two weeks.  One of the more common complications is sinusitis, which is usually caused by viruses and rarely (about 2 percent of the time) by bacteria. Having thick or yellow to green-colored nasal discharge does not mean that bacterial sinusitis has developed; discolored nasal discharge is a normal phase of the common cold.  Lower respiratory infections, such as pneumonia or bronchitis, may develop following a cold.  Infection of the middle ear, or otitis media, can accompany or follow a cold.  COMMON COLD TREATMENT - There is no specific treatment for the viruses that cause the common cold. Most treatments are aimed at relieving some of the symptoms of the cold, but do not shorten or cure the cold. Antibiotics are not useful for treating the common cold; antibiotics are only used to treat illnesses caused by bacteria, not viruses. Unnecessary use of antibiotics for the treatment of the common cold can cause allergic reactions, diarrhea, or other gastrointestinal symptoms in some patients.  The symptoms of  a cold will resolve over time, even without any treatment. People with underlying medical conditions and those who use other over-the-counter or prescription medications should speak with their healthcare provider or pharmacist to ensure that it is safe to use these treatments. The following are treatments that may reduce the symptoms caused by the common cold.  Nasal congestion - Decongestants are good for nasal congestion- if you feel very stuffy but no mucus is coming out, this is the medication that will help you the most.  Pseudoephedrine is a decongestant that can improve nasal congestion. Although a prescription is not required, drugstores in the [place] keep pseudoephedrine behind the counter, so it must be requested from a pharmacist. If you have a heart condition or high blood pressure please use Coricidin BPH instead.   Runny nose - Antihistamines such as diphenhydramine (Benadryl), certazine (Zyrtec) which are best taking at night because they can make you tired OR loratadine (Claritin),  fexafinadine (Allegra) help with a runny nose.   Nasal sprays such an oxymetazoline (Afrin and others) may also give temporary relief of nasal congestion. However, these sprays should never be used for more than two to three days; use for more than three days use can worsen congestion.  Nasocort is now over the counter and can help decrease a runny nose. Please stop the medication if you have blurry vision or nose bleeds.   Sore throat and headache - Sore throat and headache are best treated with a mild pain reliever such as acetaminophen (Tylenol) or a non-steroidal anti-inflammatory agent  such as ibuprofen or naproxen (Motrin or Aleve). These medications should be taken with food to prevent stomach problems. As well as gargling with warm water and salt.   Cough - Common cough medicine ingredients include guaifenesin and dextromethorphan; these are often combined with other medications in  over-the-counter cold formulas. Often a cough is worse at night or first in the morning due to post nasal drip from you nose. You can try to sleep at an angle to decrease a cough.   Alternative treatments - Heated, humidified air can improve symptoms of nasal congestion and runny nose, and causes few to no side effects. A number of alternative products, including vitamin C, doubling up on your vitamin D and herbal products such as echinacea, may help. Certain products, such as nasal gels that contain zinc (eg, Zicam), have been associated with a permanent loss of smell.  Antibiotics - Antibiotics should not be used to treat an uncomplicated common cold. As noted above, colds are caused by viruses. Antibiotics treat bacterial, not viral infections. Some viruses that cause the common cold can also depress the immune system or cause swelling in the lining of the nose or airways; this can, in turn, lead to a bacterial infection. Often you need to give your body 7 days to fight off a common cold while treating the symptoms with the medications listed above. If after 7 days your symptoms are not improving, you are getting worse, you have shortness of breath, chest pain, a fever of over 103 you should seek medical help immediately.   PREVENTION IS THE BEST MEDICINE - Hand washing is an essential and highly effective way to prevent the spread of infection.  Alcohol-based hand rubs are a good alternative for disinfecting hands if a sink is not available.  Hands should be washed before preparing food and eating and after coughing, blowing the nose, or sneezing. While it is not always possible to limit contact with people who may be infected with a cold, touching the eyes, nose, or mouth after direct contact should be avoided when possible. Sneezing/coughing into the sleeve of one's clothing (at the inner elbow) is another means of containing sprays of saliva and secretions and does not contaminate the  hands.

## 2013-07-03 ENCOUNTER — Other Ambulatory Visit: Payer: Self-pay

## 2013-07-03 MED ORDER — BISOPROLOL-HYDROCHLOROTHIAZIDE 5-6.25 MG PO TABS: ORAL_TABLET | ORAL | Status: DC

## 2013-07-13 ENCOUNTER — Encounter: Payer: Self-pay | Admitting: Internal Medicine

## 2013-07-13 ENCOUNTER — Ambulatory Visit (INDEPENDENT_AMBULATORY_CARE_PROVIDER_SITE_OTHER): Payer: BC Managed Care – PPO | Admitting: Internal Medicine

## 2013-07-13 VITALS — BP 116/78 | HR 60 | Temp 97.5°F | Resp 16 | Ht 69.0 in | Wt 144.4 lb

## 2013-07-13 DIAGNOSIS — Z0000 Encounter for general adult medical examination without abnormal findings: Secondary | ICD-10-CM

## 2013-07-13 DIAGNOSIS — R7402 Elevation of levels of lactic acid dehydrogenase (LDH): Secondary | ICD-10-CM

## 2013-07-13 DIAGNOSIS — R7401 Elevation of levels of liver transaminase levels: Secondary | ICD-10-CM

## 2013-07-13 DIAGNOSIS — I10 Essential (primary) hypertension: Secondary | ICD-10-CM

## 2013-07-13 DIAGNOSIS — R740 Nonspecific elevation of levels of transaminase and lactic acid dehydrogenase [LDH]: Secondary | ICD-10-CM

## 2013-07-13 DIAGNOSIS — Z1212 Encounter for screening for malignant neoplasm of rectum: Secondary | ICD-10-CM

## 2013-07-13 DIAGNOSIS — Z113 Encounter for screening for infections with a predominantly sexual mode of transmission: Secondary | ICD-10-CM

## 2013-07-13 DIAGNOSIS — E559 Vitamin D deficiency, unspecified: Secondary | ICD-10-CM

## 2013-07-13 DIAGNOSIS — Z111 Encounter for screening for respiratory tuberculosis: Secondary | ICD-10-CM

## 2013-07-13 LAB — CBC WITH DIFFERENTIAL/PLATELET
Basophils Absolute: 0 10*3/uL (ref 0.0–0.1)
Basophils Relative: 0 % (ref 0–1)
Eosinophils Absolute: 0.1 10*3/uL (ref 0.0–0.7)
Eosinophils Relative: 2 % (ref 0–5)
HCT: 37.4 % (ref 36.0–46.0)
Hemoglobin: 12.9 g/dL (ref 12.0–15.0)
Lymphocytes Relative: 31 % (ref 12–46)
Lymphs Abs: 1.7 10*3/uL (ref 0.7–4.0)
MCH: 29.7 pg (ref 26.0–34.0)
MCHC: 34.5 g/dL (ref 30.0–36.0)
MCV: 86.2 fL (ref 78.0–100.0)
Monocytes Absolute: 0.3 10*3/uL (ref 0.1–1.0)
Monocytes Relative: 5 % (ref 3–12)
Neutro Abs: 3.4 10*3/uL (ref 1.7–7.7)
Neutrophils Relative %: 62 % (ref 43–77)
Platelets: 242 10*3/uL (ref 150–400)
RBC: 4.34 MIL/uL (ref 3.87–5.11)
RDW: 13.4 % (ref 11.5–15.5)
WBC: 5.5 10*3/uL (ref 4.0–10.5)

## 2013-07-13 LAB — HEMOGLOBIN A1C
Hgb A1c MFr Bld: 6.1 % — ABNORMAL HIGH (ref <5.7)
Mean Plasma Glucose: 128 mg/dL — ABNORMAL HIGH (ref <117)

## 2013-07-13 NOTE — Patient Instructions (Signed)

## 2013-07-13 NOTE — Progress Notes (Signed)
Patient ID: [name] [name], female   DOB: [birth year], 63 y.o.   MRN: [id number]  Annual Screening Comprehensive Examination  This very nice 63 y.o.MWF presents for complete physical.  Patient has been followed for HTN,   Prediabetes, Hyperlipidemia, and Vitamin D Deficiency.    HTN predates since 2001. Patient's BP has been controlled at home. Today's BP: 116/78 mmHg. Patient denies any cardiac symptoms as chest pain, palpitations, shortness of breath, dizziness or ankle swelling.   Patient's hyperlipidemia is controlled with diet. Patient denies myalgias or other medication SE's. Last lipids were  Lab Results  Component Value Date   CHOL 152 01/03/2013   HDL 50 01/03/2013   LDLCALC 85 01/03/2013   TRIG 86 01/03/2013   CHOLHDL 3.0 01/03/2013    Patient has prediabetes predating since Dec 2010 with A1c 6.0%  and last A1c was 5.9% in Dec 2014. Patient denies reactive hypoglycemic symptoms, visual blurring, diabetic polys, or paresthesias.    Finally, patient has history of Vitamin D Deficiency of 24 in 2008 and last vitamin D was 64 in Dec 2014.  Medication Sig  . acetaminophen 500 MG tablet Take 500 mg by mouth as needed for pain.  Marland Kitchen aspirin 81 MG tablet Take 81 mg by mouth every other day.   . bisoprolol-hctz  5-6.25 MG per tablet Take one tablet by mouth every day   . OS-CAL 600 MG TAB Take 600 mg by mouth daily with breakfast.  . fish oil 1000 MG cap Take 1 g by mouth 3 (three) times daily.  Marland Kitchen FLAXSEED OIL  Take by mouth 3 (three) times daily.  . MULTIVITAMIN Take 1 tablet by mouth daily.   Allergies  Allergen Reactions  . Amoxil [Amoxicillin] Rash  . Ampicillin Rash   Past Medical History  Diagnosis Date  . Heart murmur   . Hyperlipidemia   . Seasonal allergies   . Vitamin D deficiency   . Prediabetes   . Hypertension    Past Surgical History  Procedure Laterality Date  . Cardiac surgery  1958    at age 62 for hole in heart  . Colonoscopy      polyps   Family  History  Problem Relation Age of Onset  . Colon cancer Sister 70  . Diabetes Sister   . Stomach cancer Neg Hx   . Diabetes Father   . Diabetes Brother   . Diabetes Sister    History  Substance Use Topics  . Smoking status: Never Smoker   . Smokeless tobacco: Never Used  . Alcohol Use: 1.5 oz/week    3 drink(s) per week     Comment: occasional    ROS Constitutional: Denies fever, chills, weight loss/gain, headaches, insomnia, fatigue, night sweats, and change in appetite. Eyes: Denies redness, blurred vision, diplopia, discharge, itchy, watery eyes.  ENT: Denies discharge, congestion, post nasal drip, epistaxis, sore throat, earache, hearing loss, dental pain, Tinnitus, Vertigo, Sinus pain, snoring.  Cardio: Denies chest pain, palpitations, irregular heartbeat, syncope, dyspnea, diaphoresis, orthopnea, PND, claudication, edema Respiratory: denies cough, dyspnea, DOE, pleurisy, hoarseness, laryngitis, wheezing.  Gastrointestinal: Denies dysphagia, heartburn, reflux, water brash, pain, cramps, nausea, vomiting, bloating, diarrhea, constipation, hematemesis, melena, hematochezia, jaundice, hemorrhoids Genitourinary: Denies dysuria, frequency, urgency, nocturia, hesitancy, discharge, hematuria, flank pain Breast: Breast lumps, nipple discharge, bleeding.  Musculoskeletal: Denies arthralgia, myalgia, stiffness, Jt. Swelling, pain, limp, and strain/sprain. Skin: Denies puritis, rash, hives, warts, acne, eczema, changing in skin lesion Neuro: No weakness, tremor, incoordination, spasms, paresthesia, pain Psychiatric: Denies confusion,  memory loss, sensory loss Endocrine: Denies change in weight, skin, hair change, nocturia, and paresthesia, diabetic polys, visual blurring, hyper / hypo glycemic episodes.  Heme/Lymph: No excessive bleeding, bruising, enlarged lymph nodes.  Physical Exam  BP 116/78  P 60  T 97.5 F   Resp 16  Ht 5\' 9"    Wt 144 lb 6.4 oz   BMI 21.31 kg/m2  LMP  11/17/2002   General Appearance: Well nourished, in no apparent distress. Eyes: PERRLA, EOMs, conjunctiva no swelling or erythema, normal fundi and vessels. Sinuses: No frontal/maxillary tenderness ENT/Mouth: EACs patent / TMs  nl. Nares clear without erythema, swelling, mucoid exudates. Oral hygiene is good. No erythema, swelling, or exudate. Tongue normal, non-obstructing. Tonsils not swollen or erythematous. Hearing normal.  Neck: Supple, thyroid normal. No bruits, nodes or JVD. Respiratory: Respiratory effort normal.  BS equal and clear bilateral without rales, rhonci, wheezing or stridor. Cardio: Heart sounds are normal with regular rate and rhythm and no murmurs, rubs or gallops. Peripheral pulses are normal and equal bilaterally without edema. No aortic or femoral bruits. Chest: symmetric with normal excursions and percussion. Breasts: Deferred. Abdomen: Flat, soft, with bowl sounds. Nontender, no guarding, rebound, hernias, masses, or organomegaly.  Lymphatics: Non tender without lymphadenopathy.  Musculoskeletal: Full ROM all peripheral extremities, joint stability, 5/5 strength, and normal gait. Skin: Warm and dry without rashes, lesions, cyanosis, clubbing or  ecchymosis.  Neuro: Cranial nerves intact, reflexes equal bilaterally. Normal muscle tone, no cerebellar symptoms. Sensation intact.  Pysch: Awake and oriented X 3, normal affect, Insight and Judgment appropriate.   Assessment and Plan  1. Annual Screening Examination 2. Hypertension  3. Hyperlipidemia 4. Pre Diabetes 5. Vitamin D Deficiency  Continue prudent diet as discussed, weight control, BP monitoring, regular exercise, and medications. Discussed med's effects and SE's. Screening labs and tests as requested with regular follow-up as recommended.

## 2013-07-14 LAB — LIPID PANEL
Cholesterol: 170 mg/dL (ref 0–200)
HDL: 55 mg/dL (ref >39)
LDL Cholesterol: 98 mg/dL (ref 0–99)
Total CHOL/HDL Ratio: 3.1 Ratio
Triglycerides: 86 mg/dL (ref <150)
VLDL: 17 mg/dL (ref 0–40)

## 2013-07-14 LAB — BASIC METABOLIC PANEL WITHOUT GFR
BUN: 19 mg/dL (ref 6–23)
CO2: 28 meq/L (ref 19–32)
Calcium: 9.6 mg/dL (ref 8.4–10.5)
Chloride: 104 meq/L (ref 96–112)
Creat: 0.68 mg/dL (ref 0.50–1.10)
GFR, Est African American: >89 mL/min
GFR, Est Non African American: >89 mL/min
Glucose, Bld: 94 mg/dL (ref 70–99)
Potassium: 4.3 meq/L (ref 3.5–5.3)
Sodium: 142 meq/L (ref 135–145)

## 2013-07-14 LAB — TSH: TSH: 4.308 u[IU]/mL (ref 0.350–4.500)

## 2013-07-14 LAB — IRON AND TIBC
%SAT: 30 % (ref 20–55)
Iron: 94 ug/dL (ref 42–145)
TIBC: 311 ug/dL (ref 250–470)
UIBC: 217 ug/dL (ref 125–400)

## 2013-07-14 LAB — URINALYSIS, MICROSCOPIC ONLY
Bacteria, UA: NONE SEEN
Casts: NONE SEEN
Crystals: NONE SEEN
Squamous Epithelial / HPF: NONE SEEN
WBC, UA: >50 WBC/hpf — AB

## 2013-07-14 LAB — HEPATIC FUNCTION PANEL
ALT: 49 U/L — ABNORMAL HIGH (ref 0–35)
AST: 35 U/L (ref 0–37)
Albumin: 4.6 g/dL (ref 3.5–5.2)
Alkaline Phosphatase: 41 U/L (ref 39–117)
Bilirubin, Direct: 0.1 mg/dL (ref 0.0–0.3)
Indirect Bilirubin: 0.5 mg/dL (ref 0.2–1.2)
Total Bilirubin: 0.6 mg/dL (ref 0.2–1.2)
Total Protein: 7 g/dL (ref 6.0–8.3)

## 2013-07-14 LAB — HIV ANTIBODY (ROUTINE TESTING W REFLEX): HIV 1&2 Ab, 4th Generation: NONREACTIVE

## 2013-07-14 LAB — VITAMIN D 25 HYDROXY (VIT D DEFICIENCY, FRACTURES): Vit D, 25-Hydroxy: 83 ng/mL (ref 30–89)

## 2013-07-14 LAB — HEPATITIS A ANTIBODY, TOTAL: Hep A Total Ab: REACTIVE — AB

## 2013-07-14 LAB — MAGNESIUM: Magnesium: 1.7 mg/dL (ref 1.5–2.5)

## 2013-07-14 LAB — RPR

## 2013-07-14 LAB — INSULIN, FASTING: Insulin fasting, serum: 14 u[IU]/mL (ref 3–28)

## 2013-07-14 LAB — HEPATITIS C ANTIBODY: HCV Ab: NEGATIVE

## 2013-07-14 LAB — MICROALBUMIN / CREATININE URINE RATIO
Creatinine, Urine: 98.5 mg/dL
Microalb Creat Ratio: 14.6 mg/g (ref 0.0–30.0)
Microalb, Ur: 1.44 mg/dL (ref 0.00–1.89)

## 2013-07-14 LAB — HEPATITIS B SURFACE ANTIBODY,QUALITATIVE: Hep B S Ab: NEGATIVE

## 2013-07-14 LAB — VITAMIN B12: Vitamin B-12: 472 pg/mL (ref 211–911)

## 2013-07-14 LAB — HEPATITIS B CORE ANTIBODY, TOTAL: Hep B Core Total Ab: NONREACTIVE

## 2013-07-16 LAB — TB SKIN TEST
Induration: 0 mm
TB Skin Test: NEGATIVE

## 2013-07-16 LAB — HEPATITIS B E ANTIBODY: Hepatitis Be Antibody: NONREACTIVE

## 2013-10-29 ENCOUNTER — Other Ambulatory Visit: Payer: Self-pay

## 2013-10-29 DIAGNOSIS — Z1239 Encounter for other screening for malignant neoplasm of breast: Secondary | ICD-10-CM

## 2013-11-19 ENCOUNTER — Encounter: Payer: Self-pay | Admitting: Internal Medicine

## 2013-11-21 ENCOUNTER — Other Ambulatory Visit: Payer: Self-pay

## 2013-11-21 ENCOUNTER — Ambulatory Visit
Admission: RE | Admit: 2013-11-21 | Discharge: 2013-11-21 | Disposition: A | Discharge status: Home / Self Care | Payer: BC Managed Care – PPO | Source: Ambulatory Visit

## 2013-11-21 ENCOUNTER — Ambulatory Visit (INDEPENDENT_AMBULATORY_CARE_PROVIDER_SITE_OTHER): Payer: BC Managed Care – PPO | Admitting: Certified Nurse Midwife

## 2013-11-21 ENCOUNTER — Encounter: Payer: Self-pay | Admitting: Certified Nurse Midwife

## 2013-11-21 VITALS — BP 110/70 | HR 70 | Resp 16 | Ht 67.75 in | Wt 143.0 lb

## 2013-11-21 DIAGNOSIS — Z1231 Encounter for screening mammogram for malignant neoplasm of breast: Secondary | ICD-10-CM

## 2013-11-21 DIAGNOSIS — Z01419 Encounter for gynecological examination (general) (routine) without abnormal findings: Secondary | ICD-10-CM

## 2013-11-21 DIAGNOSIS — Z124 Encounter for screening for malignant neoplasm of cervix: Secondary | ICD-10-CM

## 2013-11-21 NOTE — Patient Instructions (Signed)

## 2013-11-21 NOTE — Progress Notes (Signed)
Reviewed personally.  M. Suzanne Avry Roedl, MD.  

## 2013-11-21 NOTE — Progress Notes (Signed)
63 y.o. G41P2002 Married Caucasian Fe here for annual exam. Menopausal no HRT. Denies vaginal bleeding. Using [place] for vaginal dryness, working well. Sees PCP for aex and labs. Recently visit with all normal labs. No health issues today. 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  Patient's last menstrual period was 11/17/2002.          Sexually active: Yes.    The current method of family planning is post menopausal    Exercising: Yes.    walking Smoker:  no  Health Maintenance: Pap:  11-16-11 neg hpv hr neg MMG:  11-21-13  o results yet Colonoscopy:  2012 f/u 3 yrs BMD:   none TDaP:  2014 Labs: pcp Self breast exam: done occ   reports that she has never smoked. She has never used smokeless tobacco. She reports that she drinks about 1.2 oz of alcohol per week. She reports that she does not use illicit drugs.  Past Medical History  Diagnosis Date  . Heart murmur   . Hyperlipidemia   . Seasonal allergies   . Vitamin D deficiency   . Prediabetes   . Hypertension     Past Surgical History  Procedure Laterality Date  . Cardiac surgery  1958    at age 80 for hole in heart  . Colonoscopy      polyps    Current Outpatient Prescriptions  Medication Sig Dispense Refill  . acetaminophen (TYLENOL) 500 MG tablet Take 500 mg by mouth as needed for pain.    Marland Kitchen aspirin 81 MG tablet Take 81 mg by mouth every other day.     . bisoprolol-hydrochlorothiazide (ZIAC) 5-6.25 MG per tablet Take one tablet by mouth every day in the morning for blood pressure, fluid and palpitations 90 tablet 1  . calcium carbonate (OS-CAL) 600 MG TABS tablet Take 600 mg by mouth daily with breakfast.    . fish oil-omega-3 fatty acids 1000 MG capsule Take 1 g by mouth 3 (three) times daily.    . Flaxseed, Linseed, (FLAXSEED OIL PO) Take by mouth 3 (three) times daily.    . Multiple Vitamin (MULTIVITAMIN) tablet Take 1 tablet by mouth daily.     No current facility-administered medications for this visit.    Family  History  Problem Relation Age of Onset  . Colon cancer Sister 28  . Diabetes Sister   . Stomach cancer Neg Hx   . Diabetes Father   . Diabetes Brother   . Diabetes Sister     ROS:  Pertinent items are noted in HPI.  Otherwise, a comprehensive ROS was negative.  Exam:   BP 110/70 mmHg  Pulse 70  Resp 16  Ht 5' 7.75" (1.721 m)  Wt 143 lb (64.864 kg)  BMI 21.90 kg/m2  LMP 11/17/2002 Height: 5' 7.75" (172.1 cm)  Ht Readings from Last 3 Encounters:  11/21/13 5' 7.75" (1.721 m)  07/13/13 5\' 9"  (1.753 m)  05/21/13 5\' 9"  (1.753 m)    General appearance: alert, cooperative and appears stated age Head: Normocephalic, without obvious abnormality, atraumatic Neck: no adenopathy, supple, symmetrical, trachea midline and thyroid normal to inspection and palpation Lungs: clear to auscultation bilaterally Breasts: normal appearance, no masses or tenderness, No nipple retraction or dimpling, No nipple discharge or bleeding, No axillary or supraclavicular adenopathy Heart: regular rate and rhythm Abdomen: soft, non-tender; no masses,  no organomegaly Extremities: extremities normal, atraumatic, no cyanosis or edema Skin: Skin color, texture, turgor normal. No rashes or lesions Lymph nodes: Cervical, supraclavicular, and  axillary nodes normal. No abnormal inguinal nodes palpated Neurologic: Grossly normal   Pelvic: External genitalia:  no lesions              Urethra:  normal appearing urethra with no masses, tenderness or lesions              Bartholin's and Skene's: normal                 Vagina: normal appearing vagina with normal color and discharge, no lesions              Cervix: normal, non tender, no lesions              Pap taken: Yes.   Bimanual Exam:  Uterus:  normal size, contour, position, consistency, mobility, non-tender and anteverted              Adnexa: normal adnexa and no mass, fullness, tenderness               Rectovaginal: Confirms               Anus:  normal  sphincter tone, no lesions  A:  Well Woman with normal exam  Menopausal no HRT  Hypertension with PCP management    P:   Reviewed health and wellness pertinent to exam  Aware of need to advise if vaginal bleeding  Continue follow up with PCP as  indicated  Pap smear taken today with HPV reflex   counseled on breast self exam, mammography screening, adequate intake of calcium and vitamin D, diet and exercise  return annually or prn  An After Visit Summary was printed and given to the patient.

## 2013-11-26 LAB — IPS PAP TEST WITH REFLEX TO HPV

## 2014-01-07 ENCOUNTER — Other Ambulatory Visit: Payer: Self-pay | Admitting: Internal Medicine

## 2014-01-24 ENCOUNTER — Ambulatory Visit: Payer: Self-pay | Admitting: Internal Medicine

## 2014-04-08 ENCOUNTER — Encounter: Payer: Self-pay | Admitting: Internal Medicine

## 2014-04-15 ENCOUNTER — Other Ambulatory Visit: Payer: Self-pay | Admitting: Internal Medicine

## 2014-06-03 ENCOUNTER — Other Ambulatory Visit: Payer: Self-pay | Admitting: Internal Medicine

## 2014-06-03 ENCOUNTER — Other Ambulatory Visit: Payer: Self-pay | Admitting: Physician Assistant

## 2014-06-03 DIAGNOSIS — M1990 Unspecified osteoarthritis, unspecified site: Secondary | ICD-10-CM

## 2014-06-03 MED ORDER — MELOXICAM 15 MG PO TABS: ORAL_TABLET | ORAL | Status: DC

## 2014-06-05 ENCOUNTER — Encounter: Payer: Self-pay | Admitting: Gastroenterology

## 2014-06-10 ENCOUNTER — Encounter: Payer: Self-pay | Admitting: Physician Assistant

## 2014-06-10 ENCOUNTER — Ambulatory Visit (INDEPENDENT_AMBULATORY_CARE_PROVIDER_SITE_OTHER): Payer: 59 | Admitting: Physician Assistant

## 2014-06-10 VITALS — BP 120/70 | HR 60 | Temp 97.7°F | Resp 16 | Ht 67.75 in | Wt 142.0 lb

## 2014-06-10 DIAGNOSIS — Z79899 Other long term (current) drug therapy: Secondary | ICD-10-CM

## 2014-06-10 DIAGNOSIS — E785 Hyperlipidemia, unspecified: Secondary | ICD-10-CM

## 2014-06-10 DIAGNOSIS — E559 Vitamin D deficiency, unspecified: Secondary | ICD-10-CM

## 2014-06-10 DIAGNOSIS — R7309 Other abnormal glucose: Secondary | ICD-10-CM

## 2014-06-10 DIAGNOSIS — R42 Dizziness and giddiness: Secondary | ICD-10-CM

## 2014-06-10 DIAGNOSIS — R7303 Prediabetes: Secondary | ICD-10-CM

## 2014-06-10 DIAGNOSIS — R197 Diarrhea, unspecified: Secondary | ICD-10-CM

## 2014-06-10 DIAGNOSIS — I10 Essential (primary) hypertension: Secondary | ICD-10-CM

## 2014-06-10 NOTE — Patient Instructions (Signed)
Cut ziac in half or do every other day Follow up with GI for colonoscopy Benefiber is good for constipation/diarrhea/irritable bowel syndrome, it helps with weight loss and can help lower your bad cholesterol. Please do 1-2 TBSP in the morning in water, coffee, or tea. It can take up to a month before you can see a difference with your bowel movements. It is cheapest from [employer], [employer], [employer].   Your ears and sinuses are connected by the eustachian tube. When your sinuses are inflamed, this can close off the tube and cause fluid to collect in your middle ear. This can then cause dizziness, popping, clicking, ringing, and echoing in your ears. This is often NOT an infection and does NOT require antibiotics, it is caused by inflammation so the treatments help the inflammation. This can take a long time to get better so please be patient.  Here are things you can do to help with this: - Try the Flonase or Nasonex. Remember to spray each nostril twice towards the outer part of your eye.  Do not sniff but instead pinch your nose and tilt your head back to help the medicine get into your sinuses.  The best time to do this is at bedtime.Stop if you get blurred vision or nose bleeds.  -While drinking fluids, pinch and hold nose close and swallow, to help open eustachian tubes to drain fluid behind ear drums. -Please pick one of the over the counter allergy medications below and take it once daily for allergies.  It will also help with fluid behind ear drums. Claritin or loratadine cheapest but likely the weakest  Zyrtec or certizine at night because it can make you sleepy The strongest is allegra or fexafinadine  Cheapest at [employer], [employer], [employer] -can use decongestant over the counter, please do not use if you have high blood pressure or certain heart conditions.   if worsening HA, changes vision/speech, imbalance, weakness go to the ER  Dizziness Dizziness is a common problem. It is a feeling of  unsteadiness or light-headedness. You may feel like you are about to faint. Dizziness can lead to injury if you stumble or fall. A person of any age group can suffer from dizziness, but dizziness is more common in older adults. CAUSES  Dizziness can be caused by many different things, including:  Middle ear problems.  Standing for too long.  Infections.  An allergic reaction.  Aging.  An emotional response to something, such as the sight of blood.  Side effects of medicines.  Tiredness.  Problems with circulation or blood pressure.  Excessive use of alcohol or medicines, or illegal drug use.  Breathing too fast (hyperventilation).  An irregular heart rhythm (arrhythmia).  A low red blood cell count (anemia).  Pregnancy.  Vomiting, diarrhea, fever, or other illnesses that cause body fluid loss (dehydration).  Diseases or conditions such as Parkinson's disease, high blood pressure (hypertension), diabetes, and thyroid problems.  Exposure to extreme heat. DIAGNOSIS  Your health care provider will ask about your symptoms, perform a physical exam, and perform an electrocardiogram (ECG) to record the electrical activity of your heart. Your health care provider may also perform other heart or blood tests to determine the cause of your dizziness. These may include:  Transthoracic echocardiogram (TTE). During echocardiography, sound waves are used to evaluate how blood flows through your heart.  Transesophageal echocardiogram (TEE).  Cardiac monitoring. This allows your health care provider to monitor your heart rate and rhythm in real time.  Holter monitor. This is a portable device that records your heartbeat and can help diagnose heart arrhythmias. It allows your health care provider to track your heart activity for several days if needed.  Stress tests by exercise or by giving medicine that makes the heart beat faster. TREATMENT  Treatment of dizziness depends on the  cause of your symptoms and can vary greatly. HOME CARE INSTRUCTIONS   Drink enough fluids to keep your urine clear or pale yellow. This is especially important in very hot weather. In older adults, it is also important in cold weather.  Take your medicine exactly as directed if your dizziness is caused by medicines. When taking blood pressure medicines, it is especially important to get up slowly.  Rise slowly from chairs and steady yourself until you feel okay.  In the morning, first sit up on the side of the bed. When you feel okay, stand slowly while holding onto something until you know your balance is fine.  Move your legs often if you need to stand in one place for a long time. Tighten and relax your muscles in your legs while standing.  Have someone stay with you for 1-2 days if dizziness continues to be a problem. Do this until you feel you are well enough to stay alone. Have the person call your health care provider if he or she notices changes in you that are concerning.  Do not drive or use heavy machinery if you feel dizzy.  Do not drink alcohol. SEEK IMMEDIATE MEDICAL CARE IF:   Your dizziness or light-headedness gets worse.  You feel nauseous or vomit.  You have problems talking, walking, or using your arms, hands, or legs.  You feel weak.  You are not thinking clearly or you have trouble forming sentences. It may take a friend or family member to notice this.  You have chest pain, abdominal pain, shortness of breath, or sweating.  Your vision changes.  You notice any bleeding.  You have side effects from medicine that seems to be getting worse rather than better. MAKE SURE YOU:   Understand these instructions.  Will watch your condition.  Will get help right away if you are not doing well or get worse. Document Released: 06/30/2000 Document Revised: 01/09/2013 Document Reviewed: 07/24/2010 [facility] Patient Information 2015 [place], [place]. This information  is not intended to replace advice given to you by your health care provider. Make sure you discuss any questions you have with your health care provider.   10 Tips on Belching, Bloating, and Flatulence 1. Belching is caused by swallowed air from:  Eating or drinking too fast  Poorly fitting dentures; not chewing food completely  Carbonated beverages  Chewing gum or sucking on hard candies  Excessive swallowing due to nervous tension or postnasal drip  Forced belching to relieve abdominal discomfort 2. To prevent excessive belching, avoid:  Carbonated beverages  Chewing gum  Hard candies  Simethicone/GasX may be helpful  3. Abdominal bloating and discomfort may be due to intestinal sensitivity or symptoms of irritable bowel syndrome. To relieve symptoms, avoid:  Broccoli  Baked beans  Cabbage  Carbonated drinks  Cauliflower  Chewing gum  Hard candy 4. Abdominal distention resulting from weak abdominal muscles:  Is better in the morning  Gets worse as the day progresses  Is relieved by lying down 5. To prevent Abdominal distention:  Tighten abdominal muscles by pulling in your stomach several times during the day  Do sit-up exercises if possible  Wear an abdominal support garment if exercise is too difficult 6. Flatulence is gas created through bacterial action in the bowel and passed rectally. Keep in mind that:  10-18 passages per day are normal  Primary gases are harmless and odorless  Noticeable smells are trace gases related to food intake 7. Foods that are likely to form gas include:  Milk, dairy products, and medications that contain lactose--If your body doesn't produce the enzyme (lactase) to break it down.  Certain vegetables--baked beans, cauliflower, broccoli, cabbage  Certain starches--wheat, oats, corn, potatoes. Rice is a good substitute. 8. Identify offending foods. Reduce or eliminate these gas-forming foods from your diet. 9.

## 2014-06-10 NOTE — Progress Notes (Signed)
Subjective:    Patient ID: [name] [name], female    DOB: 11-09-1950, 64 y.o.   MRN: [id number]  HPI 64 y.o. female with history of HTN, preDM, chol presents with dizziness. She states yesterday afternoon felt that she was walking into the wall, towards the right, then it happened one time this morning she felt her balance was off. She had her husband drove her. She is on ziac 5mg  and bASA. She denies HA, changes vision/speech, weakness, CP, SOB, palpitations. She does admit to having nasal congestion, some ringing in her ears, no fever, chills. She has also has some diarrhea x 3 days, no black stool/blood in her stool, no AB pain.   Blood pressure 120/70, pulse 60, temperature 97.7 F (36.5 C), resp. rate 16, height 5' 7.75" (1.721 m), weight 142 lb (64.411 kg), last menstrual period 11/17/2002.  Past Medical History  Diagnosis Date  . Heart murmur   . Hyperlipidemia   . Seasonal allergies   . Vitamin D deficiency   . Prediabetes   . Hypertension    Current Outpatient Prescriptions on File Prior to Visit  Medication Sig Dispense Refill  . acetaminophen (TYLENOL) 500 MG tablet Take 500 mg by mouth as needed for pain.    Marland Kitchen aspirin 81 MG tablet Take 81 mg by mouth every other day.     . bisoprolol-hydrochlorothiazide (ZIAC) 5-6.25 MG per tablet TAKE ONE TABLET BY MOUTH ONCE DAILY IN THE MORNING FOR  BLOOD  PRESSURE,  FLUID,  AND  PALPITATIONS 90 tablet 1  . calcium carbonate (OS-CAL) 600 MG TABS tablet Take 600 mg by mouth daily with breakfast.    . fish oil-omega-3 fatty acids 1000 MG capsule Take 1 g by mouth 3 (three) times daily.    . Flaxseed, Linseed, (FLAXSEED OIL PO) Take by mouth 3 (three) times daily.    . meloxicam (MOBIC) 15 MG tablet TAKE ONE TABLET DAILY WITH FOOD AS NEEDED FOR PAIN 90 tablet 1  . Multiple Vitamin (MULTIVITAMIN) tablet Take 1 tablet by mouth daily.     No current facility-administered medications on file prior to visit.    Review of Systems   Constitutional: Negative for fever, chills, diaphoresis, activity change, appetite change, fatigue and unexpected weight change.  HENT: Positive for congestion, postnasal drip, rhinorrhea and sinus pressure. Negative for ear pain, facial swelling, hearing loss, nosebleeds, sneezing, sore throat, trouble swallowing and voice change.   Eyes: Negative.  Negative for visual disturbance.  Respiratory: Negative.  Negative for shortness of breath.   Cardiovascular: Negative.  Negative for chest pain.  Gastrointestinal: Positive for diarrhea and abdominal distention. Negative for nausea, vomiting, abdominal pain, constipation, blood in stool, anal bleeding and rectal pain.  Genitourinary: Negative.  Negative for dysuria, urgency and frequency.  Musculoskeletal: Negative.   Skin: Negative.  Negative for rash.  Neurological: Positive for dizziness. Negative for tremors, seizures, syncope, facial asymmetry, speech difficulty, light-headedness, numbness and headaches.  Hematological: Negative.   Psychiatric/Behavioral: Negative.        Objective:   Physical Exam  Constitutional: She is oriented to person, place, and time. She appears well-developed and well-nourished.  HENT:  Head: Normocephalic and atraumatic.  Right Ear: Hearing and external ear normal. No mastoid tenderness. Tympanic membrane is not injected, not erythematous, not retracted and not bulging. A middle ear effusion is present.  Left Ear: Hearing and external ear normal. No mastoid tenderness. Tympanic membrane is not injected, not erythematous, not retracted and not bulging. A middle ear  effusion is present.  Eyes: Conjunctivae are normal. Pupils are equal, round, and reactive to light.  Neck: Normal range of motion. Neck supple.  Cardiovascular: Normal rate.   Pulmonary/Chest: Effort normal and breath sounds normal.  Musculoskeletal: Normal range of motion.  Neurological: She is alert and oriented to person, place, and time. She  displays no tremor and normal reflexes. No cranial nerve deficit. She exhibits normal muscle tone. Coordination and gait normal.  Skin: Skin is warm and dry.        Assessment & Plan:  Dizziness Normal neuro- DDX includes hypotension, dehydration due to diarrhea, allergies Cut ziac in half, due for colonoscopy bc of family history follow up with GI, check labs, get on allergy pill and samples of nasal spay was given. Continue bASA if worsening HA, changes vision/speech, imbalance, weakness go to the ER

## 2014-06-11 LAB — HEPATIC FUNCTION PANEL
ALT: 14 U/L (ref 0–35)
AST: 19 U/L (ref 0–37)
Albumin: 4.3 g/dL (ref 3.5–5.2)
Alkaline Phosphatase: 39 U/L (ref 39–117)
Bilirubin, Direct: 0.1 mg/dL (ref 0.0–0.3)
Indirect Bilirubin: 0.3 mg/dL (ref 0.2–1.2)
Total Bilirubin: 0.4 mg/dL (ref 0.2–1.2)
Total Protein: 6.6 g/dL (ref 6.0–8.3)

## 2014-06-11 LAB — CBC WITH DIFFERENTIAL/PLATELET
Basophils Absolute: 0.1 10*3/uL (ref 0.0–0.1)
Basophils Relative: 1 % (ref 0–1)
Eosinophils Absolute: 0.1 10*3/uL (ref 0.0–0.7)
Eosinophils Relative: 2 % (ref 0–5)
HCT: 36.1 % (ref 36.0–46.0)
Hemoglobin: 12.2 g/dL (ref 12.0–15.0)
Lymphocytes Relative: 36 % (ref 12–46)
Lymphs Abs: 2 10*3/uL (ref 0.7–4.0)
MCH: 30.4 pg (ref 26.0–34.0)
MCHC: 33.8 g/dL (ref 30.0–36.0)
MCV: 90 fL (ref 78.0–100.0)
MPV: 11 fL (ref 8.6–12.4)
Monocytes Absolute: 0.3 10*3/uL (ref 0.1–1.0)
Monocytes Relative: 6 % (ref 3–12)
Neutro Abs: 3.1 10*3/uL (ref 1.7–7.7)
Neutrophils Relative %: 55 % (ref 43–77)
Platelets: 242 10*3/uL (ref 150–400)
RBC: 4.01 MIL/uL (ref 3.87–5.11)
RDW: 13.4 % (ref 11.5–15.5)
WBC: 5.6 10*3/uL (ref 4.0–10.5)

## 2014-06-11 LAB — BASIC METABOLIC PANEL WITH GFR
BUN: 14 mg/dL (ref 6–23)
CO2: 31 mEq/L (ref 19–32)
Calcium: 9.5 mg/dL (ref 8.4–10.5)
Chloride: 104 mEq/L (ref 96–112)
Creat: 0.59 mg/dL (ref 0.50–1.10)
GFR, Est African American: >89 mL/min
GFR, Est Non African American: >89 mL/min
Glucose, Bld: 111 mg/dL — ABNORMAL HIGH (ref 70–99)
Potassium: 3.9 mEq/L (ref 3.5–5.3)
Sodium: 143 mEq/L (ref 135–145)

## 2014-06-11 LAB — TSH: TSH: 3.106 u[IU]/mL (ref 0.350–4.500)

## 2014-06-11 LAB — MAGNESIUM: Magnesium: 1.9 mg/dL (ref 1.5–2.5)

## 2014-07-19 ENCOUNTER — Encounter: Payer: Self-pay | Admitting: Internal Medicine

## 2014-07-26 ENCOUNTER — Ambulatory Visit (INDEPENDENT_AMBULATORY_CARE_PROVIDER_SITE_OTHER): Payer: 59 | Admitting: Internal Medicine

## 2014-07-26 ENCOUNTER — Encounter: Payer: Self-pay | Admitting: Internal Medicine

## 2014-07-26 VITALS — BP 128/70 | HR 56 | Temp 97.5°F | Resp 16 | Ht 68.0 in | Wt 144.0 lb

## 2014-07-26 DIAGNOSIS — R7303 Prediabetes: Secondary | ICD-10-CM

## 2014-07-26 DIAGNOSIS — R5383 Other fatigue: Secondary | ICD-10-CM

## 2014-07-26 DIAGNOSIS — I10 Essential (primary) hypertension: Secondary | ICD-10-CM

## 2014-07-26 DIAGNOSIS — Z79899 Other long term (current) drug therapy: Secondary | ICD-10-CM

## 2014-07-26 DIAGNOSIS — Z6821 Body mass index (BMI) 21.0-21.9, adult: Secondary | ICD-10-CM

## 2014-07-26 DIAGNOSIS — E559 Vitamin D deficiency, unspecified: Secondary | ICD-10-CM

## 2014-07-26 DIAGNOSIS — E785 Hyperlipidemia, unspecified: Secondary | ICD-10-CM

## 2014-07-26 DIAGNOSIS — Z1212 Encounter for screening for malignant neoplasm of rectum: Secondary | ICD-10-CM

## 2014-07-26 DIAGNOSIS — R7309 Other abnormal glucose: Secondary | ICD-10-CM

## 2014-07-26 LAB — HEMOGLOBIN A1C
Hgb A1c MFr Bld: 5.9 % — ABNORMAL HIGH (ref <5.7)
Mean Plasma Glucose: 123 mg/dL — ABNORMAL HIGH (ref <117)

## 2014-07-26 LAB — BASIC METABOLIC PANEL WITH GFR
BUN: 17 mg/dL (ref 6–23)
CO2: 30 mEq/L (ref 19–32)
Calcium: 9.7 mg/dL (ref 8.4–10.5)
Chloride: 101 mEq/L (ref 96–112)
Creat: 0.6 mg/dL (ref 0.50–1.10)
GFR, Est African American: >89 mL/min
GFR, Est Non African American: >89 mL/min
Glucose, Bld: 78 mg/dL (ref 70–99)
Potassium: 4.5 mEq/L (ref 3.5–5.3)
Sodium: 140 mEq/L (ref 135–145)

## 2014-07-26 NOTE — Progress Notes (Signed)
Patient ID: [name] [name], female   DOB: November 05, 1950, 64 y.o.   MRN: [id number]  Annual Comprehensive Examination  This very nice 64 y.o. MWF presents for complete physical.  Patient has been followed for HTN, Prediabetes, Hyperlipidemia, and Vitamin D Deficiency.    HTN predates since 2001. Patient's BP has been controlled at home and patient denies any cardiac symptoms as chest pain, palpitations, shortness of breath, dizziness or ankle swelling. Today's BP: 128/70 mmHg    Patient's hyperlipidemia is controlled with diet and medications. Patient denies myalgias or other medication SE's. Last lipids were at goal with Total Chol 170, HD 55, Trig 86 & LDL 98.     Patient has prediabetes predating since 2012 with an A1c 6.0% and in 2014 her A1c was 5.7% and patient denies reactive hypoglycemic symptoms, visual blurring, diabetic polys or paresthesias. Last A1c was 6.1% on 07/14/2014.   Finally, patient has history of Vitamin D Deficiency and last Vitamin D was 83 on 07/13/2013.      Medication Sig  . acetaminophen 500 MG tablet Take 500 mg by mouth as needed for pain.  Marland Kitchen aspirin 81 MG tablet Take 81 mg by mouth every other day.   . bisoprolol-hctz (ZIAC) 5-6.25 TAKE ONE TABLET BY MOUTH ONCE DAILY IN THE MORNING FOR  BLOOD  PRESSURE,  FLUID,  AND  PALPITATIONS  . OS-CAL 600 MG TABS Take 600 mg by mouth daily with breakfast.  . fish oil 1000 MG capsule Take 1 g by mouth 3 (three) times daily.  Marland Kitchen FLAXSEED OIL  Take by mouth 3 (three) times daily.  . meloxicam  15 MG tablet TAKE ONE TABLET DAILY WITH FOOD AS NEEDED FOR PAIN  . Multiple Vitamin Take 1 tablet by mouth daily.   Allergies  Allergen Reactions  . Amoxil [Amoxicillin] Rash  . Ampicillin Rash   Past Medical History  Diagnosis Date  . Heart murmur   . Hyperlipidemia   . Seasonal allergies   . Vitamin D deficiency   . Prediabetes   . Hypertension    Health Maintenance  Topic Date Due  . COLONOSCOPY  03/23/2014  . INFLUENZA  VACCINE  08/19/2014  . MAMMOGRAM  11/22/2015  . PAP SMEAR  11/21/2016  . TETANUS/TDAP  07/06/2022  . ZOSTAVAX  Completed  . HIV Screening  Completed   Immunization History  Administered Date(s) Administered  . Influenza Split 01/03/2013  . PPD Test 07/13/2013  . Pneumococcal-Unspecified 01/18/2002  . Tdap 01/19/2003, 07/05/2012  . Zoster 07/05/2012   Past Surgical History  Procedure Laterality Date  . Cardiac surgery  1958    at age 64 for hole in heart  . Colonoscopy      polyps   Family History  Problem Relation Age of Onset  . Colon cancer Sister 62  . Diabetes Sister   . Stomach cancer Neg Hx   . Diabetes Father   . Diabetes Brother   . Diabetes Sister    History  Substance Use Topics  . Smoking status: Never Smoker   . Smokeless tobacco: Never Used  . Alcohol Use: 1.2 oz/week    2 Standard drinks or equivalent per week     Comment: occasional wine    ROS Constitutional: Denies fever, chills, weight loss/gain, headaches, insomnia,  night sweats, and change in appetite. Does c/o fatigue. Eyes: Denies redness, blurred vision, diplopia, discharge, itchy, watery eyes.  ENT: Denies discharge, congestion, post nasal drip, epistaxis, sore throat, earache, hearing loss, dental pain, Tinnitus, Vertigo,  Sinus pain, snoring.  Cardio: Denies chest pain, palpitations, irregular heartbeat, syncope, dyspnea, diaphoresis, orthopnea, PND, claudication, edema Respiratory: denies cough, dyspnea, DOE, pleurisy, hoarseness, laryngitis, wheezing.  Gastrointestinal: Denies dysphagia, heartburn, reflux, water brash, pain, cramps, nausea, vomiting, bloating, diarrhea, constipation, hematemesis, melena, hematochezia, jaundice, hemorrhoids Genitourinary: Denies dysuria, frequency, urgency, nocturia, hesitancy, discharge, hematuria, flank pain Breast: Breast lumps, nipple discharge, bleeding.  Musculoskeletal: Denies arthralgia, myalgia, stiffness, Jt. Swelling, pain, limp, and strain/sprain.  Denies falls. Skin: Denies puritis, rash, hives, warts, acne, eczema, changing in skin lesion Neuro: No weakness, tremor, incoordination, spasms, paresthesia, pain Psychiatric: Denies confusion, memory loss, sensory loss. Denies Depression. Endocrine: Denies change in weight, skin, hair change, nocturia, and paresthesia, diabetic polys, visual blurring, hyper / hypo glycemic episodes.  Heme/Lymph: No excessive bleeding, bruising, enlarged lymph nodes.  Physical Exam  BP 128/70   Pulse 56  Temp 97.5 F   Resp 16  Ht 5\' 8"    Wt 144 lb      BMI 21.90   General Appearance: Well nourished and in no apparent distress. Eyes: PERRLA, EOMs, conjunctiva no swelling or erythema, normal fundi and vessels. Sinuses: No frontal/maxillary tenderness ENT/Mouth: EACs patent / TMs  nl. Nares clear without erythema, swelling, mucoid exudates. Oral hygiene is good. No erythema, swelling, or exudate. Tongue normal, non-obstructing. Tonsils not swollen or erythematous. Hearing normal.  Neck: Supple, thyroid normal. No bruits, nodes or JVD. Respiratory: Respiratory effort normal.  BS equal and clear bilateral without rales, rhonci, wheezing or stridor. Cardio: Heart sounds are normal with regular rate and rhythm and no murmurs, rubs or gallops. Peripheral pulses are normal and equal bilaterally without edema. No aortic or femoral bruits. Chest: symmetric with normal excursions and percussion. Breasts: Deferred at pt request to GYN exam. Abdomen: Flat, soft, with bowel sounds. Nontender, no guarding, rebound, hernias, masses, or organomegaly.  Lymphatics: Non tender without lymphadenopathy.  Musculoskeletal: Full ROM all peripheral extremities, joint stability, 5/5 strength, and normal gait. Skin: Warm and dry without rashes, lesions, cyanosis, clubbing or  ecchymosis.  Neuro: Cranial nerves intact, reflexes equal bilaterally. Normal muscle tone, no cerebellar symptoms. Sensation intact.  Pysch: Awake and  oriented X 3, normal affect, Insight and Judgment appropriate.   Assessment and Plan  1. Essential hypertension   2. Hyperlipidemia   3. Prediabetes  - Hemoglobin A1c  4. Vitamin D deficiency   5. Screening for rectal cancer   6. Other fatigue   7. Medication management  - BASIC METABOLIC PANEL WITH GFR  * Note patient has a $5,200 Insurance deductible & requested to limit lab testing to the bare minimum and defer til she turns 65 next year and goes on M/C.   Continue prudent diet as discussed, weight control, BP monitoring, regular exercise, and medications. Discussed med's effects and SE's. Screening labs and tests as requested with regular follow-up as recommended.  Over 40 minutes of exam, counseling, chart review was performed.

## 2014-07-26 NOTE — Patient Instructions (Signed)
Recommend Adult Low dose Aspirin or coated  Aspirin 81 mg daily   To reduce risk of Colon Cancer 20 %,   Skin Cancer 26 % ,   Melanoma 46%   and   Pancreatic cancer 60% ++++++++++++++++++ Vitamin D goal is between 70-100.   Please make sure that you are taking your Vitamin D as directed.   It is very important as a natural anti-inflammatory   helping hair, skin, and nails, as well as reducing stroke and heart attack risk.   It helps your bones and helps with mood.  It also decreases numerous cancer risks so please take it as directed.   Low Vit D is associated with a 200-300% higher risk for CANCER   and 200-300% higher risk for HEART   ATTACK  &  STROKE.   ......................................  It is also associated with higher death rate at younger ages,   autoimmune diseases like Rheumatoid arthritis, Lupus, Multiple Sclerosis.     Also many other serious conditions, like depression, Alzheimer's  Dementia, infertility, muscle aches, fatigue, fibromyalgia - just to name a few.  +++++++++++++++++++  Recommend the book "The END of DIETING" by Dr Joel Fuhrman   & the book "The END of DIABETES " by Dr Joel Fuhrman  At [employer] - get book & Audio CD's     Being diabetic has a  300% increased risk for heart attack, stroke, cancer, and alzheimer- type vascular dementia. It is very important that you work harder with diet by avoiding all foods that are white. Avoid white rice (brown & wild rice is OK), white potatoes (sweetpotatoes in moderation is OK), White bread or wheat bread or anything made out of white flour like bagels, donuts, rolls, buns, biscuits, cakes, pastries, cookies, pizza crust, and pasta (made from white flour & egg whites) - vegetarian pasta or spinach or wheat pasta is OK. Multigrain breads like Arnold's or [employer], or multigrain sandwich thins or flatbreads.  Diet, exercise and weight loss can reverse and cure diabetes in the early stages.   Diet, exercise and weight loss is very important in the control and prevention of complications of diabetes which affects every system in your body, ie. Brain - dementia/stroke, eyes - glaucoma/blindness, heart - heart attack/heart failure, kidneys - dialysis, stomach - gastric paralysis, intestines - malabsorption, nerves - severe painful neuritis, circulation - gangrene & loss of a leg(s), and finally cancer and Alzheimers.    I recommend avoid fried & greasy foods,  sweets/candy, white rice (brown or wild rice or Quinoa is OK), white potatoes (sweet potatoes are OK) - anything made from white flour - bagels, doughnuts, rolls, buns, biscuits,white and wheat breads, pizza crust and traditional pasta made of white flour & egg white(vegetarian pasta or spinach or wheat pasta is OK).  Multi-grain bread is OK - like multi-grain flat bread or sandwich thins. Avoid alcohol in excess. Exercise is also important.    Eat all the vegetables you want - avoid meat, especially red meat and dairy - especially cheese.  Cheese is the most concentrated form of trans-fats which is the worst thing to clog up our arteries. Veggie cheese is OK which can be found in the fresh produce section at [employer] or [employer] or [employer]  ++++++++++++++++++++++++++   Preventive Care for Adults  A healthy lifestyle and preventive care can promote health and wellness. Preventive health guidelines for women include the following key practices.  A routine yearly physical is a good way   to check with your health care provider about your health and preventive screening. It is a chance to share any concerns and updates on your health and to receive a thorough exam.  Visit your dentist for a routine exam and preventive care every 6 months. Brush your teeth twice a day and floss once a day. Good oral hygiene prevents tooth decay and gum disease.  The frequency of eye exams is based on your age, health, family medical history, use  of contact lenses, and other factors. Follow your health care provider's recommendations for frequency of eye exams.  Eat a healthy diet. Foods like vegetables, fruits, whole grains, low-fat dairy products, and lean protein foods contain the nutrients you need without too many calories. Decrease your intake of foods high in solid fats, added sugars, and salt. Eat the right amount of calories for you.Get information about a proper diet from your health care provider, if necessary.  Regular physical exercise is one of the most important things you can do for your health. Most adults should get at least 150 minutes of moderate-intensity exercise (any activity that increases your heart rate and causes you to sweat) each week. In addition, most adults need muscle-strengthening exercises on 2 or more days a week.  Maintain a healthy weight. The body mass index (BMI) is a screening tool to identify possible weight problems. It provides an estimate of body fat based on height and weight. Your health care provider can find your BMI and can help you achieve or maintain a healthy weight.For adults 20 years and older:  A BMI below 18.5 is considered underweight.  A BMI of 18.5 to 24.9 is normal.  A BMI of 25 to 29.9 is considered overweight.  A BMI of 30 and above is considered obese.  Maintain normal blood lipids and cholesterol levels by exercising and minimizing your intake of saturated fat. Eat a balanced diet with plenty of fruit and vegetables. Blood tests for lipids and cholesterol should begin at age 49 and be repeated every 5 years. If your lipid or cholesterol levels are high, you are over 50, or you are at high risk for heart disease, you may need your cholesterol levels checked more frequently.Ongoing high lipid and cholesterol levels should be treated with medicines if diet and exercise are not working.  If you smoke, find out from your health care provider how to quit. If you do not use  tobacco, do not start.  Lung cancer screening is recommended for adults aged 6-80 years who are at high risk for developing lung cancer because of a history of smoking. A yearly low-dose CT scan of the lungs is recommended for people who have at least a 30-pack-year history of smoking and are a current smoker or have quit within the past 15 years. A pack year of smoking is smoking an average of 1 pack of cigarettes a day for 1 year (for example: 1 pack a day for 30 years or 2 packs a day for 15 years). Yearly screening should continue until the smoker has stopped smoking for at least 15 years. Yearly screening should be stopped for people who develop a health problem that would prevent them from having lung cancer treatment.  High blood pressure causes heart disease and increases the risk of stroke. Your blood pressure should be checked at least every 1 to 2 years. Ongoing high blood pressure should be treated with medicines if weight loss and exercise do not work.  If you  are 21-72 years old, ask your health care provider if you should take aspirin to prevent strokes.  Diabetes screening involves taking a blood sample to check your fasting blood sugar level. This should be done once every 3 years, after age 16, if you are within normal weight and without risk factors for diabetes. Testing should be considered at a younger age or be carried out more frequently if you are overweight and have at least 1 risk factor for diabetes.  Breast cancer screening is essential preventive care for women. You should practice "breast self-awareness." This means understanding the normal appearance and feel of your breasts and may include breast self-examination. Any changes detected, no matter how small, should be reported to a health care provider. Women in their 4s and 30s should have a clinical breast exam (CBE) by a health care provider as part of a regular health exam every 1 to 3 years. After age 39, women should  have a CBE every year. Starting at age 31, women should consider having a mammogram (breast X-ray test) every year. Women who have a family history of breast cancer should talk to their health care provider about genetic screening. Women at a high risk of breast cancer should talk to their health care providers about having an MRI and a mammogram every year.  Breast cancer gene (BRCA)-related cancer risk assessment is recommended for women who have family members with BRCA-related cancers. BRCA-related cancers include breast, ovarian, tubal, and peritoneal cancers. Having family members with these cancers may be associated with an increased risk for harmful changes (mutations) in the breast cancer genes BRCA1 and BRCA2. Results of the assessment will determine the need for genetic counseling and BRCA1 and BRCA2 testing.  Routine pelvic exams to screen for cancer are no longer recommended for nonpregnant women who are considered low risk for cancer of the pelvic organs (ovaries, uterus, and vagina) and who do not have symptoms. Ask your health care provider if a screening pelvic exam is right for you.  If you have had past treatment for cervical cancer or a condition that could lead to cancer, you need Pap tests and screening for cancer for at least 20 years after your treatment. If Pap tests have been discontinued, your risk factors (such as having a new sexual partner) need to be reassessed to determine if screening should be resumed. Some women have medical problems that increase the chance of getting cervical cancer. In these cases, your health care provider may recommend more frequent screening and Pap tests.  Colorectal cancer can be detected and often prevented. Most routine colorectal cancer screening begins at the age of 85 years and continues through age 75 years. However, your health care provider may recommend screening at an earlier age if you have risk factors for colon cancer. On a yearly  basis, your health care provider may provide home test kits to check for hidden blood in the stool. Use of a small camera at the end of a tube, to directly examine the colon (sigmoidoscopy or colonoscopy), can detect the earliest forms of colorectal cancer. Talk to your health care provider about this at age 83, when routine screening begins. Direct exam of the colon should be repeated every 5-10 years through age 31 years, unless early forms of pre-cancerous polyps or small growths are found.  Hepatitis C blood testing is recommended for all people born from [birth year] through 1965 and any individual with known risks for hepatitis C.  Pra  Osteoporosis  is a disease in which the bones lose minerals and strength with aging. This can result in serious bone fractures or breaks. The risk of osteoporosis can be identified using a bone density scan. Women ages 67 years and over and women at risk for fractures or osteoporosis should discuss screening with their health care providers. Ask your health care provider whether you should take a calcium supplement or vitamin D to reduce the rate of osteoporosis.  Menopause can be associated with physical symptoms and risks. Hormone replacement therapy is available to decrease symptoms and risks. You should talk to your health care provider about whether hormone replacement therapy is right for you.  Use sunscreen. Apply sunscreen liberally and repeatedly throughout the day. You should seek shade when your shadow is shorter than you. Protect yourself by wearing long sleeves, pants, a wide-brimmed hat, and sunglasses year round, whenever you are outdoors.  Once a month, do a whole body skin exam, using a mirror to look at the skin on your back. Tell your health care provider of new moles, moles that have irregular borders, moles that are larger than a pencil eraser, or moles that have changed in shape or color.  Stay current with required vaccines  (immunizations).  Influenza vaccine. All adults should be immunized every year.  Tetanus, diphtheria, and acellular pertussis (Td, Tdap) vaccine. Pregnant women should receive 1 dose of Tdap vaccine during each pregnancy. The dose should be obtained regardless of the length of time since the last dose. Immunization is preferred during the 27th-36th week of gestation. An adult who has not previously received Tdap or who does not know her vaccine status should receive 1 dose of Tdap. This initial dose should be followed by tetanus and diphtheria toxoids (Td) booster doses every 10 years. Adults with an unknown or incomplete history of completing a 3-dose immunization series with Td-containing vaccines should begin or complete a primary immunization series including a Tdap dose. Adults should receive a Td booster every 10 years.  Varicella vaccine. An adult without evidence of immunity to varicella should receive 2 doses or a second dose if she has previously received 1 dose. Pregnant females who do not have evidence of immunity should receive the first dose after pregnancy. This first dose should be obtained before leaving the health care facility. The second dose should be obtained 4-8 weeks after the first dose.  Human papillomavirus (HPV) vaccine. Females aged 13-26 years who have not received the vaccine previously should obtain the 3-dose series. The vaccine is not recommended for use in pregnant females. However, pregnancy testing is not needed before receiving a dose. If a female is found to be pregnant after receiving a dose, no treatment is needed. In that case, the remaining doses should be delayed until after the pregnancy. Immunization is recommended for any person with an immunocompromised condition through the age of 11 years if she did not get any or all doses earlier. During the 3-dose series, the second dose should be obtained 4-8 weeks after the first dose. The third dose should be obtained  24 weeks after the first dose and 16 weeks after the second dose.  Zoster vaccine. One dose is recommended for adults aged 58 years or older unless certain conditions are present.  Measles, mumps, and rubella (MMR) vaccine. Adults born before [birth year] generally are considered immune to measles and mumps. Adults born in [birth year] or later should have 1 or more doses of MMR vaccine unless there is a contraindication  to the vaccine or there is laboratory evidence of immunity to each of the three diseases. A routine second dose of MMR vaccine should be obtained at least 28 days after the first dose for students attending postsecondary schools, health care workers, or international travelers. People who received inactivated measles vaccine or an unknown type of measles vaccine during 1963-1967 should receive 2 doses of MMR vaccine. People who received inactivated mumps vaccine or an unknown type of mumps vaccine before 1979 and are at high risk for mumps infection should consider immunization with 2 doses of MMR vaccine. For females of childbearing age, rubella immunity should be determined. If there is no evidence of immunity, females who are not pregnant should be vaccinated. If there is no evidence of immunity, females who are pregnant should delay immunization until after pregnancy. Unvaccinated health care workers born before [birth year] who lack laboratory evidence of measles, mumps, or rubella immunity or laboratory confirmation of disease should consider measles and mumps immunization with 2 doses of MMR vaccine or rubella immunization with 1 dose of MMR vaccine.  Pneumococcal 13-valent conjugate (PCV13) vaccine. When indicated, a person who is uncertain of her immunization history and has no record of immunization should receive the PCV13 vaccine. An adult aged 26 years or older who has certain medical conditions and has not been previously immunized should receive 1 dose of PCV13 vaccine. This PCV13 should be followed  with a dose of pneumococcal polysaccharide (PPSV23) vaccine. The PPSV23 vaccine dose should be obtained at least 8 weeks after the dose of PCV13 vaccine. An adult aged 78 years or older who has certain medical conditions and previously received 1 or more doses of PPSV23 vaccine should receive 1 dose of PCV13. The PCV13 vaccine dose should be obtained 1 or more years after the last PPSV23 vaccine dose.    Pneumococcal polysaccharide (PPSV23) vaccine. When PCV13 is also indicated, PCV13 should be obtained first. All adults aged 9 years and older should be immunized. An adult younger than age 13 years who has certain medical conditions should be immunized. Any person who resides in a nursing home or long-term care facility should be immunized. An adult smoker should be immunized. People with an immunocompromised condition and certain other conditions should receive both PCV13 and PPSV23 vaccines. People with human immunodeficiency virus (HIV) infection should be immunized as soon as possible after diagnosis. Immunization during chemotherapy or radiation therapy should be avoided. Routine use of PPSV23 vaccine is not recommended for American Indians, [place] Natives, or people younger than 65 years unless there are medical conditions that require PPSV23 vaccine. When indicated, people who have unknown immunization and have no record of immunization should receive PPSV23 vaccine. One-time revaccination 5 years after the first dose of PPSV23 is recommended for people aged 19-64 years who have chronic kidney failure, nephrotic syndrome, asplenia, or immunocompromised conditions. People who received 1-2 doses of PPSV23 before age 64 years should receive another dose of PPSV23 vaccine at age 53 years or later if at least 5 years have passed since the previous dose. Doses of PPSV23 are not needed for people immunized with PPSV23 at or after age 23 years.  Preventive Services / Frequency   Ages 73 to 11 years  Blood  pressure check.  Lipid and cholesterol check.  Lung cancer screening. / Every year if you are aged 42-80 years and have a 30-pack-year history of smoking and currently smoke or have quit within the past 15 years. Yearly screening is stopped once  you have quit smoking for at least 15 years or develop a health problem that would prevent you from having lung cancer treatment.  Clinical breast exam.** / Every year after age 58 years.  BRCA-related cancer risk assessment.** / For women who have family members with a BRCA-related cancer (breast, ovarian, tubal, or peritoneal cancers).  Mammogram.** / Every year beginning at age 59 years and continuing for as long as you are in good health. Consult with your health care provider.  Pap test.** / Every 3 years starting at age 36 years through age 25 or 70 years with a history of 3 consecutive normal Pap tests.  HPV screening.** / Every 3 years from ages 49 years through ages 57 to 89 years with a history of 3 consecutive normal Pap tests.  Fecal occult blood test (FOBT) of stool. / Every year beginning at age 63 years and continuing until age 63 years. You may not need to do this test if you get a colonoscopy every 10 years.  Flexible sigmoidoscopy or colonoscopy.** / Every 5 years for a flexible sigmoidoscopy or every 10 years for a colonoscopy beginning at age 56 years and continuing until age 18 years.  Hepatitis C blood test.** / For all people born from [birth year] through 1965 and any individual with known risks for hepatitis C.  Skin self-exam. / Monthly.  Influenza vaccine. / Every year.  Tetanus, diphtheria, and acellular pertussis (Tdap/Td) vaccine.** / Consult your health care provider. Pregnant women should receive 1 dose of Tdap vaccine during each pregnancy. 1 dose of Td every 10 years.  Varicella vaccine.** / Consult your health care provider. Pregnant females who do not have evidence of immunity should receive the first dose after  pregnancy.  Zoster vaccine.** / 1 dose for adults aged 74 years or older.  Pneumococcal 13-valent conjugate (PCV13) vaccine.** / Consult your health care provider.  Pneumococcal polysaccharide (PPSV23) vaccine.** / 1 to 2 doses if you smoke cigarettes or if you have certain conditions.  Meningococcal vaccine.** / Consult your health care provider.  Hepatitis A vaccine.** / Consult your health care provider.  Hepatitis B vaccine.** / Consult your health care provider. Screening for abdominal aortic aneurysm (AAA)  by ultrasound is recommended for people over 50 who have history of high blood pressure or who are current or former smokers.

## 2014-08-27 ENCOUNTER — Encounter: Payer: Self-pay | Admitting: Internal Medicine

## 2014-08-27 ENCOUNTER — Other Ambulatory Visit: Payer: Self-pay | Admitting: *Deleted

## 2014-08-27 DIAGNOSIS — Z1212 Encounter for screening for malignant neoplasm of rectum: Secondary | ICD-10-CM

## 2014-08-27 LAB — POC HEMOCCULT BLD/STL (HOME/3-CARD/SCREEN)
Card #2 Fecal Occult Blod, POC: NEGATIVE
Card #3 Fecal Occult Blood, POC: NEGATIVE
Fecal Occult Blood, POC: NEGATIVE

## 2014-11-25 ENCOUNTER — Other Ambulatory Visit: Payer: Self-pay

## 2014-11-25 DIAGNOSIS — Z1231 Encounter for screening mammogram for malignant neoplasm of breast: Secondary | ICD-10-CM

## 2014-11-26 ENCOUNTER — Ambulatory Visit (INDEPENDENT_AMBULATORY_CARE_PROVIDER_SITE_OTHER): Payer: 59 | Admitting: Certified Nurse Midwife

## 2014-11-26 ENCOUNTER — Encounter: Payer: Self-pay | Admitting: Certified Nurse Midwife

## 2014-11-26 VITALS — BP 110/76 | HR 72 | Resp 16 | Ht 67.75 in | Wt 146.0 lb

## 2014-11-26 DIAGNOSIS — Z124 Encounter for screening for malignant neoplasm of cervix: Secondary | ICD-10-CM | POA: Diagnosis not present

## 2014-11-26 DIAGNOSIS — Z01419 Encounter for gynecological examination (general) (routine) without abnormal findings: Secondary | ICD-10-CM | POA: Diagnosis not present

## 2014-11-26 NOTE — Patient Instructions (Signed)

## 2014-11-26 NOTE — Progress Notes (Signed)
64 y.o. G72P2002 Married  Caucasian Fe here for annual exam. Menopausal no HRT. Denies vaginal bleeding or vaginal dryness. Sees Dr Melford Aase for aex/labs/hypertension management. Sexually active no issues. Occasional stress incontinence no issues. No health issues today. Just returned from [place]!   Patient's last menstrual period was 11/17/2002.          Sexually active: Yes.    The current method of family planning is status post menopausal Exercising: Yes.    walking Smoker:  no  Health Maintenance: Pap:  11-21-13 neg MMG:  11-21-13 category b density,birads 1:neg Colonoscopy: 2012 f/u 56yrs, ifob done per PCP will do colonoscopy next year BMD:   none TDaP:  2014 Labs: pcp Self breast exam: done occ   reports that she has never smoked. She has never used smokeless tobacco. She reports that she drinks about 1.8 oz of alcohol per week. She reports that she does not use illicit drugs.  Past Medical History  Diagnosis Date  . Heart murmur   . Hyperlipidemia   . Seasonal allergies   . Vitamin D deficiency   . Prediabetes   . Hypertension     Past Surgical History  Procedure Laterality Date  . Cardiac surgery  1958    at age 41 for hole in heart  . Colonoscopy      polyps    Current Outpatient Prescriptions  Medication Sig Dispense Refill  . acetaminophen (TYLENOL) 500 MG tablet Take 500 mg by mouth as needed for pain.    Marland Kitchen aspirin 81 MG tablet Take 81 mg by mouth every other day.     . bisoprolol-hydrochlorothiazide (ZIAC) 5-6.25 MG per tablet TAKE ONE TABLET BY MOUTH ONCE DAILY IN THE MORNING FOR  BLOOD  PRESSURE,  FLUID,  AND  PALPITATIONS 90 tablet 1  . calcium carbonate (OS-CAL) 600 MG TABS tablet Take 600 mg by mouth daily with breakfast.    . fish oil-omega-3 fatty acids 1000 MG capsule Take 1 g by mouth 3 (three) times daily.    . Flaxseed, Linseed, (FLAXSEED OIL PO) Take by mouth 3 (three) times daily.    Marland Kitchen MAGNESIUM PO Take 400 mg by mouth daily.    . meloxicam  (MOBIC) 15 MG tablet TAKE ONE TABLET DAILY WITH FOOD AS NEEDED FOR PAIN 90 tablet 1  . Multiple Vitamin (MULTIVITAMIN) tablet Take 1 tablet by mouth daily.     No current facility-administered medications for this visit.    Family History  Problem Relation Age of Onset  . Colon cancer Sister 13  . Diabetes Sister   . Stomach cancer Neg Hx   . Diabetes Father   . Diabetes Brother   . Diabetes Sister     ROS:  Pertinent items are noted in HPI.  Otherwise, a comprehensive ROS was negative.  Exam:   BP 110/76 mmHg  Pulse 72  Resp 16  Ht 5' 7.75" (1.721 m)  Wt 146 lb (66.225 kg)  BMI 22.36 kg/m2  LMP 11/17/2002 Height: 5' 7.75" (172.1 cm) Ht Readings from Last 3 Encounters:  11/26/14 5' 7.75" (1.721 m)  07/26/14 5\' 8"  (1.727 m)  06/10/14 5' 7.75" (1.721 m)    General appearance: alert, cooperative and appears stated age Head: Normocephalic, without obvious abnormality, atraumatic Neck: no adenopathy, supple, symmetrical, trachea midline and thyroid normal to inspection and palpation Lungs: clear to auscultation bilaterally Breasts: normal appearance, no masses or tenderness, No nipple retraction or dimpling, No nipple discharge or bleeding, No axillary or  supraclavicular adenopathy Heart: regular rate and rhythm Abdomen: soft, non-tender; no masses,  no organomegaly Extremities: extremities normal, atraumatic, no cyanosis or edema Skin: Skin color, texture, turgor normal. No rashes or lesions Lymph nodes: Cervical, supraclavicular, and axillary nodes normal. No abnormal inguinal nodes palpated Neurologic: Grossly normal   Pelvic: External genitalia:  no lesions              Urethra:  normal appearing urethra with no masses, tenderness or lesions              Bartholin's and Skene's: normal                 Vagina: normal appearing vagina with normal color and discharge, no lesions, good support              Cervix: normal,non tender, no lesions              Pap taken:  Yes.   Bimanual Exam:  Uterus:  normal size, contour, position, consistency, mobility, non-tender              Adnexa: normal adnexa and no mass, fullness, tenderness               Rectovaginal: Confirms               Anus:  normal sphincter tone, no lesions  Chaperone present: yes   A:  Well Woman with normal exam  Menopausal no HRT  Hypertension with PCP management  P:   Reviewed health and wellness pertinent to exam  Aware of need to evaluate if vaginal bleeding  Continue with PCP follow as indicated  Pap smear as above with HPVHR   counseled on breast self exam, mammography screening, menopause, adequate intake of calcium and vitamin D, diet and exercise, Kegel's exercises  return annually or prn  An After Visit Summary was printed and given to the patient.

## 2014-11-27 NOTE — Progress Notes (Signed)
Encounter reviewed Jill Jertson, MD   

## 2014-11-29 LAB — IPS PAP TEST WITH HPV

## 2014-12-01 ENCOUNTER — Other Ambulatory Visit: Payer: Self-pay | Admitting: Internal Medicine

## 2014-12-18 ENCOUNTER — Other Ambulatory Visit: Payer: Self-pay | Admitting: Internal Medicine

## 2015-01-01 ENCOUNTER — Ambulatory Visit
Admission: RE | Admit: 2015-01-01 | Discharge: 2015-01-01 | Disposition: A | Discharge status: Home / Self Care | Payer: 59 | Source: Ambulatory Visit

## 2015-01-01 DIAGNOSIS — Z1231 Encounter for screening mammogram for malignant neoplasm of breast: Secondary | ICD-10-CM

## 2015-01-09 ENCOUNTER — Ambulatory Visit: Payer: Self-pay

## 2015-03-31 ENCOUNTER — Encounter: Payer: Self-pay | Admitting: Internal Medicine

## 2015-03-31 ENCOUNTER — Ambulatory Visit (INDEPENDENT_AMBULATORY_CARE_PROVIDER_SITE_OTHER): Payer: BLUE CROSS/BLUE SHIELD | Admitting: Internal Medicine

## 2015-03-31 VITALS — BP 108/62 | HR 56 | Temp 98.0°F | Resp 16 | Ht 68.0 in | Wt 147.0 lb

## 2015-03-31 DIAGNOSIS — E559 Vitamin D deficiency, unspecified: Secondary | ICD-10-CM

## 2015-03-31 DIAGNOSIS — E785 Hyperlipidemia, unspecified: Secondary | ICD-10-CM | POA: Diagnosis not present

## 2015-03-31 DIAGNOSIS — R7303 Prediabetes: Secondary | ICD-10-CM | POA: Diagnosis not present

## 2015-03-31 DIAGNOSIS — Z79899 Other long term (current) drug therapy: Secondary | ICD-10-CM | POA: Diagnosis not present

## 2015-03-31 DIAGNOSIS — F418 Other specified anxiety disorders: Secondary | ICD-10-CM | POA: Diagnosis not present

## 2015-03-31 DIAGNOSIS — I10 Essential (primary) hypertension: Secondary | ICD-10-CM

## 2015-03-31 NOTE — Progress Notes (Signed)
Assessment and Plan:  Hypertension:  -Continue medication,  -monitor blood pressure at home.  -Continue DASH diet.   -Reminder to go to the ER if any CP, SOB, nausea, dizziness, severe HA, changes vision/speech, left arm numbness and tingling, and jaw pain.  Cholesterol: -Continue diet and exercise.    Pre-diabetes: -Continue diet and exercise.    Vitamin D Def: -continue medications.   Situational anxiety -can try benadryl 25-50 mg prior to car ride -patient declined xanax  Continue diet and meds as discussed. Further disposition pending results of labs.  HPI 65 y.o. female  presents for 3 month follow up with hypertension, hyperlipidemia, prediabetes and vitamin D.   Her blood pressure has been controlled at home, today their BP is BP: 108/62 mmHg.   She does workout. She denies chest pain, shortness of breath, dizziness.   She is on cholesterol medication and denies myalgias. Her cholesterol is at goal. The cholesterol last visit was:   Lab Results  Component Value Date   CHOL 170 07/13/2013   HDL 55 07/13/2013   LDLCALC 98 07/13/2013   TRIG 86 07/13/2013   CHOLHDL 3.1 07/13/2013     She has been working on diet and exercise for prediabetes, and denies foot ulcerations, hyperglycemia, hypoglycemia , increased appetite, nausea, paresthesia of the feet, polydipsia, polyuria, visual disturbances, vomiting and weight loss. Last A1C in the office was:  Lab Results  Component Value Date   HGBA1C 5.9* 07/26/2014    Patient is on Vitamin D supplement.  Lab Results  Component Value Date   VD25OH 69 07/13/2013     Patient reports that she is due to go to [place] and she does tend to have some anxiety when in traffice.  She would like to know if there is anything that she can try.  Current Medications:  Current Outpatient Prescriptions on File Prior to Visit  Medication Sig Dispense Refill  . aspirin 81 MG tablet Take 81 mg by mouth every other day.     .  bisoprolol-hydrochlorothiazide (ZIAC) 5-6.25 MG tablet TAKE ONE TABLET BY MOUTH ONCE DAILY IN THE MORNING FOR BLOOD PRESSURE, FLUID  AND  PALPITATIONS 90 tablet 0  . calcium carbonate (OS-CAL) 600 MG TABS tablet Take 600 mg by mouth daily with breakfast.    . fish oil-omega-3 fatty acids 1000 MG capsule Take 1 g by mouth 3 (three) times daily.    . Flaxseed, Linseed, (FLAXSEED OIL PO) Take by mouth 3 (three) times daily.    Marland Kitchen MAGNESIUM PO Take 400 mg by mouth daily.    . meloxicam (MOBIC) 15 MG tablet TAKE ONE TABLET BY MOUTH ONCE DAILY WITH FOOD AS NEEDED FOR PAIN 90 tablet 0  . Multiple Vitamin (MULTIVITAMIN) tablet Take 1 tablet by mouth daily.     No current facility-administered medications on file prior to visit.    Medical History:  Past Medical History  Diagnosis Date  . Heart murmur   . Hyperlipidemia   . Seasonal allergies   . Vitamin D deficiency   . Prediabetes   . Hypertension     Allergies:  Allergies  Allergen Reactions  . Amoxil [Amoxicillin] Rash  . Ampicillin Rash     Review of Systems:  Review of Systems  Constitutional: Negative for fever, chills and malaise/fatigue.  HENT: Negative for congestion, ear pain and sore throat.   Eyes: Negative.   Respiratory: Negative for cough, shortness of breath and wheezing.   Cardiovascular: Negative for chest pain, palpitations and  leg swelling.  Gastrointestinal: Negative for heartburn, abdominal pain, diarrhea, constipation, blood in stool and melena.  Genitourinary: Negative.   Neurological: Negative for dizziness, sensory change, loss of consciousness and headaches.  Psychiatric/Behavioral: Negative for depression. The patient is not nervous/anxious and does not have insomnia.     Family history- Review and unchanged  Social history- Review and unchanged  Physical Exam: BP 108/62 mmHg  Pulse 56  Temp(Src) 98 F (36.7 C) (Temporal)  Resp 16  Ht 5\' 8"  (1.727 m)  Wt 147 lb (66.679 kg)  BMI 22.36 kg/m2   LMP 11/17/2002 Wt Readings from Last 3 Encounters:  03/31/15 147 lb (66.679 kg)  11/26/14 146 lb (66.225 kg)  07/26/14 144 lb (65.318 kg)    General Appearance: Well nourished well developed, in no apparent distress. Eyes: PERRLA, EOMs, conjunctiva no swelling or erythema ENT/Mouth: Ear canals normal without obstruction, swelling, erythma, discharge.  TMs normal bilaterally.  Oropharynx moist, clear, without exudate, or postoropharyngeal swelling. Neck: Supple, thyroid normal,no cervical adenopathy  Respiratory: Respiratory effort normal, Breath sounds clear A&P without rhonchi, wheeze, or rale.  No retractions, no accessory usage. Cardio: RRR with no MRGs. Brisk peripheral pulses without edema.  Abdomen: Soft, + BS,  Non tender, no guarding, rebound, hernias, masses. Musculoskeletal: Full ROM, 5/5 strength, Normal gait Skin: Warm, dry without rashes, lesions, ecchymosis.  Neuro: Awake and oriented X 3, Cranial nerves intact. Normal muscle tone, no cerebellar symptoms. Psych: Normal affect, Insight and Judgment appropriate.    Starlyn Skeans, PA-C 11:00 AM [facility]

## 2015-05-01 ENCOUNTER — Other Ambulatory Visit: Payer: Self-pay | Admitting: Internal Medicine

## 2015-08-07 ENCOUNTER — Encounter: Payer: Self-pay | Admitting: Internal Medicine

## 2015-08-23 ENCOUNTER — Other Ambulatory Visit: Payer: Self-pay | Admitting: Physician Assistant

## 2015-10-08 ENCOUNTER — Encounter: Payer: Self-pay | Admitting: Internal Medicine

## 2015-10-08 ENCOUNTER — Ambulatory Visit (INDEPENDENT_AMBULATORY_CARE_PROVIDER_SITE_OTHER): Payer: Medicare HMO | Admitting: Internal Medicine

## 2015-10-08 VITALS — BP 116/76 | HR 60 | Temp 97.0°F | Resp 16 | Ht 68.0 in | Wt 145.2 lb

## 2015-10-08 DIAGNOSIS — R5383 Other fatigue: Secondary | ICD-10-CM | POA: Diagnosis not present

## 2015-10-08 DIAGNOSIS — E559 Vitamin D deficiency, unspecified: Secondary | ICD-10-CM

## 2015-10-08 DIAGNOSIS — R7303 Prediabetes: Secondary | ICD-10-CM | POA: Diagnosis not present

## 2015-10-08 DIAGNOSIS — Z136 Encounter for screening for cardiovascular disorders: Secondary | ICD-10-CM | POA: Diagnosis not present

## 2015-10-08 DIAGNOSIS — Z79899 Other long term (current) drug therapy: Secondary | ICD-10-CM | POA: Diagnosis not present

## 2015-10-08 DIAGNOSIS — Z1212 Encounter for screening for malignant neoplasm of rectum: Secondary | ICD-10-CM

## 2015-10-08 DIAGNOSIS — I10 Essential (primary) hypertension: Secondary | ICD-10-CM

## 2015-10-08 DIAGNOSIS — E785 Hyperlipidemia, unspecified: Secondary | ICD-10-CM | POA: Diagnosis not present

## 2015-10-08 DIAGNOSIS — D509 Iron deficiency anemia, unspecified: Secondary | ICD-10-CM | POA: Diagnosis not present

## 2015-10-08 LAB — CBC WITH DIFFERENTIAL/PLATELET
Basophils Absolute: 0 cells/uL (ref 0–200)
Basophils Relative: 0 %
Eosinophils Absolute: 94 cells/uL (ref 15–500)
Eosinophils Relative: 2 %
HCT: 39.3 % (ref 35.0–45.0)
Hemoglobin: 13 g/dL (ref 11.7–15.5)
Lymphocytes Relative: 35 %
Lymphs Abs: 1645 cells/uL (ref 850–3900)
MCH: 30.4 pg (ref 27.0–33.0)
MCHC: 33.1 g/dL (ref 32.0–36.0)
MCV: 91.8 fL (ref 80.0–100.0)
MPV: 11.3 fL (ref 7.5–12.5)
Monocytes Absolute: 282 cells/uL (ref 200–950)
Monocytes Relative: 6 %
Neutro Abs: 2679 cells/uL (ref 1500–7800)
Neutrophils Relative %: 57 %
Platelets: 245 10*3/uL (ref 140–400)
RBC: 4.28 MIL/uL (ref 3.80–5.10)
RDW: 13 % (ref 11.0–15.0)
WBC: 4.7 10*3/uL (ref 3.8–10.8)

## 2015-10-08 LAB — BASIC METABOLIC PANEL WITH GFR
BUN: 16 mg/dL (ref 7–25)
CO2: 28 mmol/L (ref 20–31)
Calcium: 9.6 mg/dL (ref 8.6–10.4)
Chloride: 100 mmol/L (ref 98–110)
Creat: 0.63 mg/dL (ref 0.50–0.99)
GFR, Est African American: >89 mL/min (ref >=60)
GFR, Est Non African American: >89 mL/min (ref >=60)
Glucose, Bld: 85 mg/dL (ref 65–99)
Potassium: 4.1 mmol/L (ref 3.5–5.3)
Sodium: 137 mmol/L (ref 135–146)

## 2015-10-08 LAB — VITAMIN B12: Vitamin B-12: 303 pg/mL (ref 200–1100)

## 2015-10-08 LAB — TSH: TSH: 3.27 mIU/L

## 2015-10-08 LAB — LIPID PANEL
Cholesterol: 177 mg/dL (ref 125–200)
HDL: 62 mg/dL (ref >=46)
LDL Cholesterol: 92 mg/dL (ref <130)
Total CHOL/HDL Ratio: 2.9 Ratio (ref <=5.0)
Triglycerides: 116 mg/dL (ref <150)
VLDL: 23 mg/dL (ref <30)

## 2015-10-08 LAB — IRON AND TIBC
%SAT: 31 % (ref 11–50)
Iron: 103 ug/dL (ref 45–160)
TIBC: 337 ug/dL (ref 250–450)
UIBC: 234 ug/dL (ref 125–400)

## 2015-10-08 LAB — HEPATIC FUNCTION PANEL
ALT: 15 U/L (ref 6–29)
AST: 20 U/L (ref 10–35)
Albumin: 4.4 g/dL (ref 3.6–5.1)
Alkaline Phosphatase: 42 U/L (ref 33–130)
Bilirubin, Direct: 0.1 mg/dL (ref <=0.2)
Indirect Bilirubin: 0.6 mg/dL (ref 0.2–1.2)
Total Bilirubin: 0.7 mg/dL (ref 0.2–1.2)
Total Protein: 7.2 g/dL (ref 6.1–8.1)

## 2015-10-08 LAB — MAGNESIUM: Magnesium: 1.9 mg/dL (ref 1.5–2.5)

## 2015-10-08 NOTE — Progress Notes (Signed)
[facility] Unk Pinto, M.D.    Uvaldo Bristle. Silverio Lay, P.A.-C      Starlyn Skeans, P.A.-C  [facility]                [address] [place] [id number] Telephone [phone] Telefax [phone]   Comprehensive Evaluation &  Examination     This very nice 65 near 65 y.o. MWF presents for a  comprehensive evaluation and management of multiple medical co-morbidities.  Patient has been followed for HTN, Prediabetes, Hyperlipidemia and Vitamin D Deficiency.      HTN predates circa 2001. Patient's BP has been controlled at home and patient denies any cardiac symptoms as chest pain, palpitations, shortness of breath, dizziness or ankle swelling. Today's BP is  116/76      Patient's hyperlipidemia is controlled with diet and medications. Patient denies myalgias or other medication SE's. Last lipids were at goal: Lab Results  Component Value Date   CHOL 170 07/13/2013   HDL 55 07/13/2013   LDLCALC 98 07/13/2013   TRIG 86 07/13/2013   CHOLHDL 3.1 07/13/2013      Patient has prediabetes  With A1c 6.0% in 2012 and then 5.7% in 2014 and 6.1% in 2016  and patient denies reactive hypoglycemic symptoms, visual blurring, diabetic polys, or paresthesias. Last A1c was near goal: Lab Results  Component Value Date   HGBA1C 5.9 (H) 07/26/2014      Finally, patient has history of Vitamin D Deficiency in 2008 of "24" and last Vitamin D was at goal: Lab Results  Component Value Date   VD25OH 83 07/13/2013   Current Outpatient Prescriptions on File Prior to Visit  Medication Sig  . aspirin 81 MG tablet Take  every other day.   . bisoprolol-hctz [facility]) 5-6.25  TAKE ONE TAB ONCE DAILY  . OS-CAL 600 MG  Take  daily   . fish oil-omega-3  1000 MG  Take 3 times daily.  Marland Kitchen FLAXSEED OIL  Take  3  times daily.  Marland Kitchen MAGNESIUM  400 mg Take   daily.  . meloxicam 15 MG TAKE ONE TAB DAILY AS NEEDED   . Multiple Vitamin   Take  daily.   Allergies  Allergen Reactions  . Amoxil [Amoxicillin] Rash  . Ampicillin Rash   Past Medical History:  Diagnosis Date  . Heart murmur   . Hyperlipidemia   . Hypertension   . Prediabetes   . Seasonal allergies   . Vitamin D deficiency    Health Maintenance  Topic Date Due  . COLONOSCOPY  03/23/2014  . INFLUENZA VACCINE  10/19/2015 (Originally 08/19/2015)  . MAMMOGRAM  12/31/2016  . PAP SMEAR  11/25/2017  . TETANUS/TDAP  07/06/2022  . ZOSTAVAX  Completed  . Hepatitis C Screening  Completed  . HIV Screening  Completed   Immunization History  Administered Date(s) Administered  . Influenza Split 01/03/2013  . PPD Test 07/13/2013  . Pneumococcal-Unspecified 01/18/2002  . Tdap 01/19/2003, 07/05/2012  . Zoster 07/05/2012   Past Surgical History:  Procedure Laterality Date  . CARDIAC SURGERY  1958   at age 65 for hole in heart  . CERVIX LESION DESTRUCTION N/A ? years ago   with abnormal pap  . COLONOSCOPY     polyps   Family History  Problem Relation Age of Onset  . Colon cancer Sister 65  . Diabetes Sister   .  Stomach cancer Neg Hx   . Diabetes Father   . Diabetes Brother   . Diabetes Sister    Social History  Substance Use Topics  . Smoking status: Never Smoker  . Smokeless tobacco: Never Used  . Alcohol use 1.8 oz/week    3 Standard drinks or equivalent per week     Comment: occasional wine    ROS Constitutional: Denies fever, chills, weight loss/gain, headaches, insomnia,  night sweats, and change in appetite. Does c/o fatigue. Eyes: Denies redness, blurred vision, diplopia, discharge, itchy, watery eyes.  ENT: Denies discharge, congestion, post nasal drip, epistaxis, sore throat, earache, hearing loss, dental pain, Tinnitus, Vertigo, Sinus pain, snoring.  Cardio: Denies chest pain, palpitations, irregular heartbeat, syncope, dyspnea, diaphoresis, orthopnea, PND, claudication, edema Respiratory: denies cough, dyspnea, DOE, pleurisy, hoarseness,  laryngitis, wheezing.  Gastrointestinal: Denies dysphagia, heartburn, reflux, water brash, pain, cramps, nausea, vomiting, bloating, diarrhea, constipation, hematemesis, melena, hematochezia, jaundice, hemorrhoids Genitourinary: Denies dysuria, frequency, urgency, nocturia, hesitancy, discharge, hematuria, flank pain Breast: Breast lumps, nipple discharge, bleeding.  Musculoskeletal: Denies arthralgia, myalgia, stiffness, Jt. Swelling, pain, limp, and strain/sprain. Denies falls. Skin: Denies puritis, rash, hives, warts, acne, eczema, changing in skin lesion Neuro: No weakness, tremor, incoordination, spasms, paresthesia, pain Psychiatric: Denies confusion, memory loss, sensory loss. Denies Depression. Endocrine: Denies change in weight, skin, hair change, nocturia, and paresthesia, diabetic polys, visual blurring, hyper / hypo glycemic episodes.  Heme/Lymph: No excessive bleeding, bruising, enlarged lymph nodes.  Physical Exam  BP 116/76   Pulse 60   Temp 97 F (36.1 C)   Resp 16   Ht 5\' 8"  (1.727 m)   Wt 145 lb 3.2 oz (65.9 kg)   LMP 11/17/2002   BMI 22.08 kg/m   General Appearance: Well nourished and in no apparent distress.  Eyes: PERRLA, EOMs, conjunctiva no swelling or erythema, normal fundi and vessels. Sinuses: No frontal/maxillary tenderness ENT/Mouth: EACs patent / TMs  nl. Nares clear without erythema, swelling, mucoid exudates. Oral hygiene is good. No erythema, swelling, or exudate. Tongue normal, non-obstructing. Tonsils not swollen or erythematous. Hearing normal.  Neck: Supple, thyroid normal. No bruits, nodes or JVD. Respiratory: Respiratory effort normal.  BS equal and clear bilateral without rales, rhonci, wheezing or stridor. Cardio: Heart sounds are normal with regular rate and rhythm and no murmurs, rubs or gallops. Peripheral pulses are normal and equal bilaterally without edema. No aortic or femoral bruits. Chest: symmetric with normal excursions and  percussion. Breasts: Symmetric, without lumps, nipple discharge, retractions, or fibrocystic changes.  Abdomen: Flat, soft with bowel sounds active. Nontender, no guarding, rebound, hernias, masses, or organomegaly.  Lymphatics: Non tender without lymphadenopathy.  Musculoskeletal: Full ROM all peripheral extremities, joint stability, 5/5 strength, and normal gait. Skin: Warm and dry without rashes, lesions, cyanosis, clubbing or  ecchymosis.  Neuro: Cranial nerves intact, reflexes equal bilaterally. Normal muscle tone, no cerebellar symptoms. Sensation intact.  Pysch: Alert and oriented X 3, normal affect, Insight and Judgment appropriate.   Assessment and Plan  1. Hypertension  - Microalbumin / creatinine urine ratio - EKG 12-Lead - TSH  2. Hyperlipidemia  - Lipid panel - TSH  3. Prediabetes  - Hemoglobin A1c - Insulin, random  4. Vitamin D deficiency  - VITAMIN D 25 Hydroxy   5. Screening for rectal cancer  - POC Hemoccult Bld/Stl   6. Screening for ischemic heart disease   7. Other fatigue  - Vitamin B12 - Iron and TIBC - CBC with Differential/Platelet  8. Medication management  -  Urinalysis, Routine w reflex microscopic  - CBC with Differential/Platelet - BASIC METABOLIC PANEL WITH GFR - Hepatic function panel - Magnesium       Continue prudent diet as discussed, weight control, BP monitoring, regular exercise, and medications. Discussed med's effects and SE's. Screening labs and tests as requested with regular follow-up as recommended. Over 40 minutes of exam, counseling, chart review and high complex critical decision making was performed.

## 2015-10-08 NOTE — Patient Instructions (Signed)
Preventive Care for Adults  A healthy lifestyle and preventive care can promote health and wellness. Preventive health guidelines for women include the following key practices.  A routine yearly physical is a good way to check with your health care provider about your health and preventive screening. It is a chance to share any concerns and updates on your health and to receive a thorough exam.  Visit your dentist for a routine exam and preventive care every 6 months. Brush your teeth twice a day and floss once a day. Good oral hygiene prevents tooth decay and gum disease.  The frequency of eye exams is based on your age, health, family medical history, use of contact lenses, and other factors. Follow your health care provider's recommendations for frequency of eye exams.  Eat a healthy diet. Foods like vegetables, fruits, whole grains, low-fat dairy products, and lean protein foods contain the nutrients you need without too many calories. Decrease your intake of foods high in solid fats, added sugars, and salt. Eat the right amount of calories for you.Get information about a proper diet from your health care provider, if necessary.  Regular physical exercise is one of the most important things you can do for your health. Most adults should get at least 150 minutes of moderate-intensity exercise (any activity that increases your heart rate and causes you to sweat) each week. In addition, most adults need muscle-strengthening exercises on 2 or more days a week.  Maintain a healthy weight. The body mass index (BMI) is a screening tool to identify possible weight problems. It provides an estimate of body fat based on height and weight. Your health care provider can find your BMI and can help you achieve or maintain a healthy weight.For adults 20 years and older:  A BMI below 18.5 is considered underweight.  A BMI of 18.5 to 24.9 is normal.  A BMI of 25 to 29.9 is considered overweight.  A BMI of  30 and above is considered obese.  Maintain normal blood lipids and cholesterol levels by exercising and minimizing your intake of saturated fat. Eat a balanced diet with plenty of fruit and vegetables. Blood tests for lipids and cholesterol should begin at age 20 and be repeated every 5 years. If your lipid or cholesterol levels are high, you are over 50, or you are at high risk for heart disease, you may need your cholesterol levels checked more frequently.Ongoing high lipid and cholesterol levels should be treated with medicines if diet and exercise are not working.  If you smoke, find out from your health care provider how to quit. If you do not use tobacco, do not start.  Lung cancer screening is recommended for adults aged 55-80 years who are at high risk for developing lung cancer because of a history of smoking. A yearly low-dose CT scan of the lungs is recommended for people who have at least a 30-pack-year history of smoking and are a current smoker or have quit within the past 15 years. A pack year of smoking is smoking an average of 1 pack of cigarettes a day for 1 year (for example: 1 pack a day for 30 years or 2 packs a day for 15 years). Yearly screening should continue until the smoker has stopped smoking for at least 15 years. Yearly screening should be stopped for people who develop a health problem that would prevent them from having lung cancer treatment.  High blood pressure causes heart disease and increases the risk of   stroke. Your blood pressure should be checked at least every 1 to 2 years. Ongoing high blood pressure should be treated with medicines if weight loss and exercise do not work.  If you are 55-79 years old, ask your health care provider if you should take aspirin to prevent strokes.  Diabetes screening involves taking a blood sample to check your fasting blood sugar level. This should be done once every 3 years, after age 45, if you are within normal weight and  without risk factors for diabetes. Testing should be considered at a younger age or be carried out more frequently if you are overweight and have at least 1 risk factor for diabetes.  Breast cancer screening is essential preventive care for women. You should practice "breast self-awareness." This means understanding the normal appearance and feel of your breasts and may include breast self-examination. Any changes detected, no matter how small, should be reported to a health care provider. Women in their 20s and 30s should have a clinical breast exam (CBE) by a health care provider as part of a regular health exam every 1 to 3 years. After age 40, women should have a CBE every year. Starting at age 40, women should consider having a mammogram (breast X-ray test) every year. Women who have a family history of breast cancer should talk to their health care provider about genetic screening. Women at a high risk of breast cancer should talk to their health care providers about having an MRI and a mammogram every year.  Breast cancer gene (BRCA)-related cancer risk assessment is recommended for women who have family members with BRCA-related cancers. BRCA-related cancers include breast, ovarian, tubal, and peritoneal cancers. Having family members with these cancers may be associated with an increased risk for harmful changes (mutations) in the breast cancer genes BRCA1 and BRCA2. Results of the assessment will determine the need for genetic counseling and BRCA1 and BRCA2 testing.  Routine pelvic exams to screen for cancer are no longer recommended for nonpregnant women who are considered low risk for cancer of the pelvic organs (ovaries, uterus, and vagina) and who do not have symptoms. Ask your health care provider if a screening pelvic exam is right for you.  If you have had past treatment for cervical cancer or a condition that could lead to cancer, you need Pap tests and screening for cancer for at least 20  years after your treatment. If Pap tests have been discontinued, your risk factors (such as having a new sexual partner) need to be reassessed to determine if screening should be resumed. Some women have medical problems that increase the chance of getting cervical cancer. In these cases, your health care provider may recommend more frequent screening and Pap tests.  Colorectal cancer can be detected and often prevented. Most routine colorectal cancer screening begins at the age of 50 years and continues through age 75 years. However, your health care provider may recommend screening at an earlier age if you have risk factors for colon cancer. On a yearly basis, your health care provider may provide home test kits to check for hidden blood in the stool. Use of a small camera at the end of a tube, to directly examine the colon (sigmoidoscopy or colonoscopy), can detect the earliest forms of colorectal cancer. Talk to your health care provider about this at age 50, when routine screening begins. Direct exam of the colon should be repeated every 5-10 years through age 75 years, unless early forms of pre-cancerous   polyps or small growths are found.  Hepatitis C blood testing is recommended for all people born from 1945 through 1965 and any individual with known risks for hepatitis C.  Pra  Osteoporosis is a disease in which the bones lose minerals and strength with aging. This can result in serious bone fractures or breaks. The risk of osteoporosis can be identified using a bone density scan. Women ages 65 years and over and women at risk for fractures or osteoporosis should discuss screening with their health care providers. Ask your health care provider whether you should take a calcium supplement or vitamin D to reduce the rate of osteoporosis.  Menopause can be associated with physical symptoms and risks. Hormone replacement therapy is available to decrease symptoms and risks. You should talk to your  health care provider about whether hormone replacement therapy is right for you.  Use sunscreen. Apply sunscreen liberally and repeatedly throughout the day. You should seek shade when your shadow is shorter than you. Protect yourself by wearing long sleeves, pants, a wide-brimmed hat, and sunglasses year round, whenever you are outdoors.  Once a month, do a whole body skin exam, using a mirror to look at the skin on your back. Tell your health care provider of new moles, moles that have irregular borders, moles that are larger than a pencil eraser, or moles that have changed in shape or color.  Stay current with required vaccines (immunizations).  Influenza vaccine. All adults should be immunized every year.  Tetanus, diphtheria, and acellular pertussis (Td, Tdap) vaccine. Pregnant women should receive 1 dose of Tdap vaccine during each pregnancy. The dose should be obtained regardless of the length of time since the last dose. Immunization is preferred during the 27th-36th week of gestation. An adult who has not previously received Tdap or who does not know her vaccine status should receive 1 dose of Tdap. This initial dose should be followed by tetanus and diphtheria toxoids (Td) booster doses every 10 years. Adults with an unknown or incomplete history of completing a 3-dose immunization series with Td-containing vaccines should begin or complete a primary immunization series including a Tdap dose. Adults should receive a Td booster every 10 years.  Varicella vaccine. An adult without evidence of immunity to varicella should receive 2 doses or a second dose if she has previously received 1 dose. Pregnant females who do not have evidence of immunity should receive the first dose after pregnancy. This first dose should be obtained before leaving the health care facility. The second dose should be obtained 4-8 weeks after the first dose.  Human papillomavirus (HPV) vaccine. Females aged 13-26 years  who have not received the vaccine previously should obtain the 3-dose series. The vaccine is not recommended for use in pregnant females. However, pregnancy testing is not needed before receiving a dose. If a female is found to be pregnant after receiving a dose, no treatment is needed. In that case, the remaining doses should be delayed until after the pregnancy. Immunization is recommended for any person with an immunocompromised condition through the age of 26 years if she did not get any or all doses earlier. During the 3-dose series, the second dose should be obtained 4-8 weeks after the first dose. The third dose should be obtained 24 weeks after the first dose and 16 weeks after the second dose.  Zoster vaccine. One dose is recommended for adults aged 60 years or older unless certain conditions are present.  Measles, mumps, and rubella (  MMR) vaccine. Adults born before [birth year] generally are considered immune to measles and mumps. Adults born in [birth year] or later should have 1 or more doses of MMR vaccine unless there is a contraindication to the vaccine or there is laboratory evidence of immunity to each of the three diseases. A routine second dose of MMR vaccine should be obtained at least 28 days after the first dose for students attending postsecondary schools, health care workers, or international travelers. People who received inactivated measles vaccine or an unknown type of measles vaccine during 1963-1967 should receive 2 doses of MMR vaccine. People who received inactivated mumps vaccine or an unknown type of mumps vaccine before 1979 and are at high risk for mumps infection should consider immunization with 2 doses of MMR vaccine. For females of childbearing age, rubella immunity should be determined. If there is no evidence of immunity, females who are not pregnant should be vaccinated. If there is no evidence of immunity, females who are pregnant should delay immunization until after pregnancy.  Unvaccinated health care workers born before [birth year] who lack laboratory evidence of measles, mumps, or rubella immunity or laboratory confirmation of disease should consider measles and mumps immunization with 2 doses of MMR vaccine or rubella immunization with 1 dose of MMR vaccine.  Pneumococcal 13-valent conjugate (PCV13) vaccine. When indicated, a person who is uncertain of her immunization history and has no record of immunization should receive the PCV13 vaccine. An adult aged 39 years or older who has certain medical conditions and has not been previously immunized should receive 1 dose of PCV13 vaccine. This PCV13 should be followed with a dose of pneumococcal polysaccharide (PPSV23) vaccine. The PPSV23 vaccine dose should be obtained at least 8 weeks after the dose of PCV13 vaccine. An adult aged 62 years or older who has certain medical conditions and previously received 1 or more doses of PPSV23 vaccine should receive 1 dose of PCV13. The PCV13 vaccine dose should be obtained 1 or more years after the last PPSV23 vaccine dose.    Pneumococcal polysaccharide (PPSV23) vaccine. When PCV13 is also indicated, PCV13 should be obtained first. All adults aged 67 years and older should be immunized. An adult younger than age 45 years who has certain medical conditions should be immunized. Any person who resides in a nursing home or long-term care facility should be immunized. An adult smoker should be immunized. People with an immunocompromised condition and certain other conditions should receive both PCV13 and PPSV23 vaccines. People with human immunodeficiency virus (HIV) infection should be immunized as soon as possible after diagnosis. Immunization during chemotherapy or radiation therapy should be avoided. Routine use of PPSV23 vaccine is not recommended for American Indians, [place] Natives, or people younger than 65 years unless there are medical conditions that require PPSV23 vaccine. When indicated,  people who have unknown immunization and have no record of immunization should receive PPSV23 vaccine. One-time revaccination 5 years after the first dose of PPSV23 is recommended for people aged 19-64 years who have chronic kidney failure, nephrotic syndrome, asplenia, or immunocompromised conditions. People who received 1-2 doses of PPSV23 before age 23 years should receive another dose of PPSV23 vaccine at age 35 years or later if at least 5 years have passed since the previous dose. Doses of PPSV23 are not needed for people immunized with PPSV23 at or after age 38 years.  Preventive Services / Frequency   Ages 43 to 86 years  Blood pressure check.  Lipid and cholesterol check.  Lung  cancer screening. / Every year if you are aged 55-80 years and have a 30-pack-year history of smoking and currently smoke or have quit within the past 15 years. Yearly screening is stopped once you have quit smoking for at least 15 years or develop a health problem that would prevent you from having lung cancer treatment.  Clinical breast exam.** / Every year after age 40 years.  BRCA-related cancer risk assessment.** / For women who have family members with a BRCA-related cancer (breast, ovarian, tubal, or peritoneal cancers).  Mammogram.** / Every year beginning at age 40 years and continuing for as long as you are in good health. Consult with your health care provider.  Pap test.** / Every 3 years starting at age 30 years through age 65 or 70 years with a history of 3 consecutive normal Pap tests.  HPV screening.** / Every 3 years from ages 30 years through ages 65 to 70 years with a history of 3 consecutive normal Pap tests.  Fecal occult blood test (FOBT) of stool. / Every year beginning at age 50 years and continuing until age 75 years. You may not need to do this test if you get a colonoscopy every 10 years.  Flexible sigmoidoscopy or colonoscopy.** / Every 5 years for a flexible sigmoidoscopy or  every 10 years for a colonoscopy beginning at age 50 years and continuing until age 75 years.  Hepatitis C blood test.** / For all people born from 1945 through 1965 and any individual with known risks for hepatitis C.  Skin self-exam. / Monthly.  Influenza vaccine. / Every year.  Tetanus, diphtheria, and acellular pertussis (Tdap/Td) vaccine.** / Consult your health care provider. Pregnant women should receive 1 dose of Tdap vaccine during each pregnancy. 1 dose of Td every 10 years.  Varicella vaccine.** / Consult your health care provider. Pregnant females who do not have evidence of immunity should receive the first dose after pregnancy.  Zoster vaccine.** / 1 dose for adults aged 60 years or older.  Pneumococcal 13-valent conjugate (PCV13) vaccine.** / Consult your health care provider.  Pneumococcal polysaccharide (PPSV23) vaccine.** / 1 to 2 doses if you smoke cigarettes or if you have certain conditions.  Meningococcal vaccine.** / Consult your health care provider.  Hepatitis A vaccine.** / Consult your health care provider.  Hepatitis B vaccine.** / Consult your health care provider. Screening for abdominal aortic aneurysm (AAA)  by ultrasound is recommended for people over 50 who have history of high blood pressure or who are current or former smokers. ++++++++++++++++++ Recommend Adult Low Dose Aspirin or  coated  Aspirin 81 mg daily  To reduce risk of Colon Cancer 20 %,  Skin Cancer 26 % ,  Melanoma 46%  and  Pancreatic cancer 60% +++++++++++++++++++ Vitamin D goal  is between 70-100.  Please make sure that you are taking your Vitamin D as directed.  It is very important as a natural anti-inflammatory  helping hair, skin, and nails, as well as reducing stroke and heart attack risk.  It helps your bones and helps with mood. It also decreases numerous cancer risks so please take it as directed.  Low Vit D is associated with a 200-300% higher risk for CANCER  and  200-300% higher risk for HEART   ATTACK  &  STROKE.   ...................................... It is also associated with higher death rate at younger ages,  autoimmune diseases like Rheumatoid arthritis, Lupus, Multiple Sclerosis.    Also many other serious conditions, like depression,   Alzheimer's Dementia, infertility, muscle aches, fatigue, fibromyalgia - just to name a few. ++++++++++++++++++ Recommend the book "The END of DIETING" by Dr Joel Fuhrman  & the book "The END of DIABETES " by Dr Joel Fuhrman At [employer] - get book & Audio CD's    Being diabetic has a  300% increased risk for heart attack, stroke, cancer, and alzheimer- type vascular dementia. It is very important that you work harder with diet by avoiding all foods that are white. Avoid white rice (brown & wild rice is OK), white potatoes (sweetpotatoes in moderation is OK), White bread or wheat bread or anything made out of white flour like bagels, donuts, rolls, buns, biscuits, cakes, pastries, cookies, pizza crust, and pasta (made from white flour & egg whites) - vegetarian pasta or spinach or wheat pasta is OK. Multigrain breads like Arnold's or [employer], or multigrain sandwich thins or flatbreads.  Diet, exercise and weight loss can reverse and cure diabetes in the early stages.  Diet, exercise and weight loss is very important in the control and prevention of complications of diabetes which affects every system in your body, ie. Brain - dementia/stroke, eyes - glaucoma/blindness, heart - heart attack/heart failure, kidneys - dialysis, stomach - gastric paralysis, intestines - malabsorption, nerves - severe painful neuritis, circulation - gangrene & loss of a leg(s), and finally cancer and Alzheimers.    I recommend avoid fried & greasy foods,  sweets/candy, white rice (brown or wild rice or Quinoa is OK), white potatoes (sweet potatoes are OK) - anything made from white flour - bagels, doughnuts, rolls, buns, biscuits,white  and wheat breads, pizza crust and traditional pasta made of white flour & egg white(vegetarian pasta or spinach or wheat pasta is OK).  Multi-grain bread is OK - like multi-grain flat bread or sandwich thins. Avoid alcohol in excess. Exercise is also important.    Eat all the vegetables you want - avoid meat, especially red meat and dairy - especially cheese.  Cheese is the most concentrated form of trans-fats which is the worst thing to clog up our arteries. Veggie cheese is OK which can be found in the fresh produce section at [employer] or [employer] or [employer]  ++++++++++++++++++++++ DASH Eating Plan  DASH stands for "Dietary Approaches to Stop Hypertension."   The DASH eating plan is a healthy eating plan that has been shown to reduce high blood pressure (hypertension). Additional health benefits may include reducing the risk of type 2 diabetes mellitus, heart disease, and stroke. The DASH eating plan may also help with weight loss. WHAT DO I NEED TO KNOW ABOUT THE DASH EATING PLAN? For the DASH eating plan, you will follow these general guidelines:  Choose foods with a percent daily value for sodium of less than 5% (as listed on the food label).  Use salt-free seasonings or herbs instead of table salt or sea salt.  Check with your health care provider or pharmacist before using salt substitutes.  Eat lower-sodium products, often labeled as "lower sodium" or "no salt added."  Eat fresh foods.  Eat more vegetables, fruits, and low-fat dairy products.  Choose whole grains. Look for the word "whole" as the first word in the ingredient list.  Choose fish   Limit sweets, desserts, sugars, and sugary drinks.  Choose heart-healthy fats.  Eat veggie cheese   Eat more home-cooked food and less restaurant, buffet, and fast food.  Limit fried foods.  Cook foods using methods other than frying.  Limit canned   vegetables. If you do use them, rinse them well to decrease the  sodium.  When eating at a restaurant, ask that your food be prepared with less salt, or no salt if possible.                      WHAT FOODS CAN I EAT? Read Dr Joel Fuhrman's books on The End of Dieting & The End of Diabetes  Grains Whole grain or whole wheat bread. Brown rice. Whole grain or whole wheat pasta. Quinoa, bulgur, and whole grain cereals. Low-sodium cereals. Corn or whole wheat flour tortillas. Whole grain cornbread. Whole grain crackers. Low-sodium crackers.  Vegetables Fresh or frozen vegetables (raw, steamed, roasted, or grilled). Low-sodium or reduced-sodium tomato and vegetable juices. Low-sodium or reduced-sodium tomato sauce and paste. Low-sodium or reduced-sodium canned vegetables.   Fruits All fresh, canned (in natural juice), or frozen fruits.  Protein Products  All fish and seafood.  Dried beans, peas, or lentils. Unsalted nuts and seeds. Unsalted canned beans.  Dairy Low-fat dairy products, such as skim or 1% milk, 2% or reduced-fat cheeses, low-fat ricotta or cottage cheese, or plain low-fat yogurt. Low-sodium or reduced-sodium cheeses.  Fats and Oils Tub margarines without trans fats. Light or reduced-fat mayonnaise and salad dressings (reduced sodium). Avocado. Safflower, olive, or canola oils. Natural peanut or almond butter.  Other Unsalted popcorn and pretzels. The items listed above may not be a complete list of recommended foods or beverages. Contact your dietitian for more options.  ++++++++++++++++++  WHAT FOODS ARE NOT RECOMMENDED? Grains/ White flour or wheat flour White bread. White pasta. White rice. Refined cornbread. Bagels and croissants. Crackers that contain trans fat.  Vegetables  Creamed or fried vegetables. Vegetables in a . Regular canned vegetables. Regular canned tomato sauce and paste. Regular tomato and vegetable juices.  Fruits Dried fruits. Canned fruit in light or heavy syrup. Fruit juice.  Meat and Other Protein  Products Meat in general - RED meat & White meat.  Fatty cuts of meat. Ribs, chicken wings, all processed meats as bacon, sausage, bologna, salami, fatback, hot dogs, bratwurst and packaged luncheon meats.  Dairy Whole or 2% milk, cream, half-and-half, and cream cheese. Whole-fat or sweetened yogurt. Full-fat cheeses or blue cheese. Non-dairy creamers and whipped toppings. Processed cheese, cheese spreads, or cheese curds.  Condiments Onion and garlic salt, seasoned salt, table salt, and sea salt. Canned and packaged gravies. Worcestershire sauce. Tartar sauce. Barbecue sauce. Teriyaki sauce. Soy sauce, including reduced sodium. Steak sauce. Fish sauce. Oyster sauce. Cocktail sauce. Horseradish. Ketchup and mustard. Meat flavorings and tenderizers. Bouillon cubes. Hot sauce. Tabasco sauce. Marinades. Taco seasonings. Relishes.  Fats and Oils Butter, stick margarine, lard, shortening and bacon fat. Coconut, palm kernel, or palm oils. Regular salad dressings.  Pickles and olives. Salted popcorn and pretzels.  The items listed above may not be a complete list of foods and beverages to avoid.    

## 2015-10-09 LAB — HEMOGLOBIN A1C
Hgb A1c MFr Bld: 5.6 % (ref <5.7)
Mean Plasma Glucose: 114 mg/dL

## 2015-10-09 LAB — URINALYSIS, ROUTINE W REFLEX MICROSCOPIC
Bilirubin Urine: NEGATIVE
Glucose, UA: NEGATIVE
Hgb urine dipstick: NEGATIVE
Ketones, ur: NEGATIVE
Leukocytes, UA: NEGATIVE
Nitrite: NEGATIVE
Protein, ur: NEGATIVE
Specific Gravity, Urine: 1.007 (ref 1.001–1.035)
pH: 6.5 (ref 5.0–8.0)

## 2015-10-09 LAB — INSULIN, RANDOM: Insulin: 10.5 u[IU]/mL (ref 2.0–19.6)

## 2015-10-09 LAB — MICROALBUMIN / CREATININE URINE RATIO
Creatinine, Urine: 28 mg/dL (ref 20–320)
Microalb, Ur: <0.2 mg/dL

## 2015-10-09 LAB — VITAMIN D 25 HYDROXY (VIT D DEFICIENCY, FRACTURES): Vit D, 25-Hydroxy: 63 ng/mL (ref 30–100)

## 2015-10-16 ENCOUNTER — Other Ambulatory Visit: Payer: Self-pay | Admitting: Internal Medicine

## 2015-10-16 DIAGNOSIS — D509 Iron deficiency anemia, unspecified: Secondary | ICD-10-CM

## 2015-10-17 DIAGNOSIS — H2513 Age-related nuclear cataract, bilateral: Secondary | ICD-10-CM | POA: Diagnosis not present

## 2015-10-23 ENCOUNTER — Ambulatory Visit (INDEPENDENT_AMBULATORY_CARE_PROVIDER_SITE_OTHER): Payer: Medicare HMO | Admitting: *Deleted

## 2015-10-23 DIAGNOSIS — Z23 Encounter for immunization: Secondary | ICD-10-CM | POA: Diagnosis not present

## 2015-10-27 ENCOUNTER — Other Ambulatory Visit: Payer: Self-pay | Admitting: Internal Medicine

## 2015-10-27 DIAGNOSIS — R5383 Other fatigue: Secondary | ICD-10-CM

## 2015-11-23 ENCOUNTER — Other Ambulatory Visit: Payer: Self-pay | Admitting: Physician Assistant

## 2015-11-25 ENCOUNTER — Encounter: Payer: Self-pay | Admitting: Internal Medicine

## 2015-11-25 ENCOUNTER — Other Ambulatory Visit: Payer: Self-pay | Admitting: Internal Medicine

## 2015-11-25 DIAGNOSIS — Z1231 Encounter for screening mammogram for malignant neoplasm of breast: Secondary | ICD-10-CM

## 2015-11-26 DIAGNOSIS — R69 Illness, unspecified: Secondary | ICD-10-CM | POA: Diagnosis not present

## 2015-11-27 ENCOUNTER — Ambulatory Visit: Payer: 59 | Admitting: Certified Nurse Midwife

## 2015-11-27 ENCOUNTER — Ambulatory Visit (INDEPENDENT_AMBULATORY_CARE_PROVIDER_SITE_OTHER): Payer: Medicare HMO | Admitting: Certified Nurse Midwife

## 2015-11-27 ENCOUNTER — Encounter: Payer: Self-pay | Admitting: Certified Nurse Midwife

## 2015-11-27 VITALS — BP 120/70 | HR 68 | Resp 16 | Ht 67.75 in | Wt 144.0 lb

## 2015-11-27 DIAGNOSIS — N951 Menopausal and female climacteric states: Secondary | ICD-10-CM

## 2015-11-27 DIAGNOSIS — Z01419 Encounter for gynecological examination (general) (routine) without abnormal findings: Secondary | ICD-10-CM

## 2015-11-27 NOTE — Progress Notes (Addendum)
65 y.o. G75P2002 Married  Caucasian Fe here for annual exam. Menopausal no HRT, no vaginal bleeding or vaginal dryness. Sees Dr. Melford Aase for aex, labs, seen recently all normal per patient. Occasional stress incontinence, no issues. Has colonoscopy scheduled for 1/18. Occasional vaginal dryness using coconut oil. No other health issues today.   Patient's last menstrual period was 11/17/2002.          Sexually active: Yes.    The current method of family planning is status post menopausal.    Exercising: No.  exercise Smoker:  no  Health Maintenance: Pap:  11-26-14 neg HPV HR neg MMG:  01-01-15 category b density birads 1:neg Colonoscopy:  2013 f/u 34yrs, scheduled for 1/18 BMD:   1/18 Left hip -1.2, Spine -0.6 osteopenia, repeat 2 years TDaP:  2014 Shingles: no Pneumonia: no Hep C and HIV: both neg 2015 Labs: pcp Self breast exam: done occ   reports that she has never smoked. She has never used smokeless tobacco. She reports that she drinks about 1.8 oz of alcohol per week . She reports that she does not use drugs.  Past Medical History:  Diagnosis Date  . Heart murmur   . Hyperlipidemia   . Hypertension   . Prediabetes   . Seasonal allergies   . Vitamin D deficiency     Past Surgical History:  Procedure Laterality Date  . CARDIAC SURGERY  1958   at age 11 for hole in heart  . CERVIX LESION DESTRUCTION N/A ? years ago   with abnormal pap  . COLONOSCOPY     polyps    Current Outpatient Prescriptions  Medication Sig Dispense Refill  . aspirin 81 MG tablet Take 81 mg by mouth every other day.     . bisoprolol-hydrochlorothiazide (ZIAC) 5-6.25 MG tablet TAKE ONE TABLET BY MOUTH ONCE DAILY IN THE MORNING FOR BLOOD PRESSURE, FLUID AND PALPITATIONS (Patient taking differently: takes 1/2 tablet) 90 tablet 1  . calcium carbonate (OS-CAL) 600 MG TABS tablet Take 600 mg by mouth daily with breakfast.    . Cholecalciferol (VITAMIN D PO) Take 5,000 Units by mouth daily.     . fish  oil-omega-3 fatty acids 1000 MG capsule Take 1 g by mouth 3 (three) times daily.    . Flaxseed, Linseed, (FLAXSEED OIL PO) Take by mouth 3 (three) times daily.    Marland Kitchen MAGNESIUM PO Take 400 mg by mouth daily.    . meloxicam (MOBIC) 15 MG tablet TAKE ONE TABLET BY MOUTH ONCE DAILY AS NEEDED FOR PAIN WITH FOOD 90 tablet 1  . Multiple Vitamin (MULTIVITAMIN) tablet Take 1 tablet by mouth daily.     No current facility-administered medications for this visit.     Family History  Problem Relation Age of Onset  . Colon cancer Sister 43  . Diabetes Sister   . Stomach cancer Neg Hx   . Diabetes Father   . Diabetes Brother   . Diabetes Sister     ROS:  Pertinent items are noted in HPI.  Otherwise, a comprehensive ROS was negative.  Exam:   BP 120/70   Pulse 68   Resp 16   Ht 5' 7.75" (1.721 m)   Wt 144 lb (65.3 kg)   LMP 11/17/2002   BMI 22.06 kg/m  Height: 5' 7.75" (172.1 cm) Ht Readings from Last 3 Encounters:  11/27/15 5' 7.75" (1.721 m)  10/08/15 5\' 8"  (1.727 m)  03/31/15 5\' 8"  (1.727 m)    General appearance: alert, cooperative and  appears stated age Head: Normocephalic, without obvious abnormality, atraumatic Neck: no adenopathy, supple, symmetrical, trachea midline and thyroid normal to inspection and palpation Lungs: clear to auscultation bilaterally Breasts: normal appearance, no masses or tenderness, No nipple retraction or dimpling, No nipple discharge or bleeding, No axillary or supraclavicular adenopathy Heart: regular rate and rhythm Abdomen: soft, non-tender; no masses,  no organomegaly Extremities: extremities normal, atraumatic, no cyanosis or edema Skin: Skin color, texture, turgor normal. No rashes or lesions Lymph nodes: Cervical, supraclavicular, and axillary nodes normal. No abnormal inguinal nodes palpated Neurologic: Grossly normal   Pelvic: External genitalia:  no lesions              Urethra:  normal appearing urethra with no masses, tenderness or  lesions              Bartholin's and Skene's: normal                 Vagina: normal appearing vagina with normal color and discharge, no lesions              Cervix: cervical motion tenderness, no cervical motion tenderness and no lesions              Pap taken: No. Bimanual Exam:  Uterus:  normal size, contour, position, consistency, mobility, non-tender and anteverted              Adnexa: normal adnexa and no mass, fullness, tenderness               Rectovaginal: Confirms               Anus:  normal sphincter tone, no lesions   Chaperone present: yes  A:  Well Woman with normal exam  Menopausal no HRT   Vaginal dryness  Hypertension with PCP management  Bone density due  Colonoscopy scheduled with PCP P:   Reviewed health and wellness pertinent to exam  Aware of need to evaluate if vaginal bleeding  Instructions given for coconut oil use for dryness and sexuality  Continue follow up with PCP as indicated.  Patient will schedule BMD and call if help needed.  Pap smear as above not taken   counseled on breast self exam, mammography screening, adequate intake of calcium and vitamin D, diet and exercise, Kegel's exercises  return annually or prn  An After Visit Summary was printed and given to the patient.

## 2015-11-27 NOTE — Patient Instructions (Signed)
EXERCISE AND DIET:  We recommended that you start or continue a regular exercise program for good health. Regular exercise means any activity that makes your heart beat faster and makes you sweat.  We recommend exercising at least 30 minutes per day at least 3 days a week, preferably 4 or 5.  We also recommend a diet low in fat and sugar.  Inactivity, poor dietary choices and obesity can cause diabetes, heart attack, stroke, and kidney damage, among others.    ALCOHOL AND SMOKING:  Women should limit their alcohol intake to no more than 7 drinks/beers/glasses of wine (combined, not each!) per week. Moderation of alcohol intake to this level decreases your risk of breast cancer and liver damage. And of course, no recreational drugs are part of a healthy lifestyle.  And absolutely no smoking or even second hand smoke. Most people know smoking can cause heart and lung diseases, but did you know it also contributes to weakening of your bones? Aging of your skin?  Yellowing of your teeth and nails?  CALCIUM AND VITAMIN D:  Adequate intake of calcium and Vitamin D are recommended.  The recommendations for exact amounts of these supplements seem to change often, but generally speaking 600 mg of calcium (either carbonate or citrate) and 800 units of Vitamin D per day seems prudent. Certain women may benefit from higher intake of Vitamin D.  If you are among these women, your doctor will have told you during your visit.    PAP SMEARS:  Pap smears, to check for cervical cancer or precancers,  have traditionally been done yearly, although recent scientific advances have shown that most women can have pap smears less often.  However, every woman still should have a physical exam from her gynecologist every year. It will include a breast check, inspection of the vulva and vagina to check for abnormal growths or skin changes, a visual exam of the cervix, and then an exam to evaluate the size and shape of the uterus and  ovaries.  And after 65 years of age, a rectal exam is indicated to check for rectal cancers. We will also provide age appropriate advice regarding health maintenance, like when you should have certain vaccines, screening for sexually transmitted diseases, bone density testing, colonoscopy, mammograms, etc.   MAMMOGRAMS:  All women over 40 years old should have a yearly mammogram. Many facilities now offer a "3D" mammogram, which may cost around $50 extra out of pocket. If possible,  we recommend you accept the option to have the 3D mammogram performed.  It both reduces the number of women who will be called back for extra views which then turn out to be normal, and it is better than the routine mammogram at detecting truly abnormal areas.    COLONOSCOPY:  Colonoscopy to screen for colon cancer is recommended for all women at age 50.  We know, you hate the idea of the prep.  We agree, BUT, having colon cancer and not knowing it is worse!!  Colon cancer so often starts as a polyp that can be seen and removed at colonscopy, which can quite literally save your life!  And if your first colonoscopy is normal and you have no family history of colon cancer, most women don't have to have it again for 10 years.  Once every ten years, you can do something that may end up saving your life, right?  We will be happy to help you get it scheduled when you are ready.    Be sure to check your insurance coverage so you understand how much it will cost.  It may be covered as a preventative service at no cost, but you should check your particular policy.      Atrophic Vaginitis Atrophic vaginitis is when the tissues that line the vagina become dry and thin. This is caused by a drop in estrogen. Estrogen helps:  To keep the vagina moist.  To make a clear fluid that helps:  To lubricate the vagina for sex.  To protect the vagina from infection. If the lining of the vagina is dry and thin, it may:  Make sex painful. It  may also cause bleeding.  Cause a feeling of:  Burning.  Irritation.  Itchiness.  Make an exam of your vagina painful. It may also cause bleeding.  Make you lose interest in sex.  Cause a burning feeling when you pee.  Make your vaginal fluid (discharge) brown or yellow. For some women, there are no symptoms. This condition is most common in women who do not get their regular menstrual periods anymore (menopause). This often starts when a woman is 19-68 years old. HOME CARE  Take medicines only as told by your doctor. Do not use any herbal or alternative medicines unless your doctor says it is okay.  Use over-the-counter products for dryness only as told by your doctor. These include:  Creams.  Lubricants.  Moisturizers.  Do not douche.  Do not use products that can make your vagina dry. These include:  Scented feminine sprays.  Scented tampons.  Scented soaps.  If it hurts to have sex, tell your sexual partner. GET HELP IF:  Your discharge looks different than normal.  Your vagina has an unusual smell.  You have new symptoms.  Your symptoms do not get better with treatment.  Your symptoms get worse.   This information is not intended to replace advice given to you by your health care provider. Make sure you discuss any questions you have with your health care provider.   Document Released: 06/23/2007 Document Revised: 05/21/2014 Document Reviewed: 12/26/2013 Elsevier Interactive Patient Education [employer].

## 2015-12-05 NOTE — Progress Notes (Signed)
Encounter reviewed Fain Francis, MD   

## 2016-01-02 ENCOUNTER — Telehealth: Payer: Self-pay | Admitting: Certified Nurse Midwife

## 2016-01-02 DIAGNOSIS — Z780 Asymptomatic menopausal state: Secondary | ICD-10-CM

## 2016-01-02 NOTE — Telephone Encounter (Signed)
Patient need an order for bone density faxed to the breast center for her appointment on January 3rd.

## 2016-01-02 NOTE — Telephone Encounter (Signed)
Spoke with patient, advised order for BMD placed. Advised [facility] to [facility] for scheduling [facility]. Patient verbalizes understanding and is agreeable.  Routing to provider for final review. Patient is agreeable to disposition. Will close encounter.  Cc: Melvia Heaps, CNM

## 2016-01-08 ENCOUNTER — Ambulatory Visit (INDEPENDENT_AMBULATORY_CARE_PROVIDER_SITE_OTHER): Payer: Medicare HMO | Admitting: Internal Medicine

## 2016-01-08 ENCOUNTER — Encounter: Payer: Self-pay | Admitting: Internal Medicine

## 2016-01-08 VITALS — BP 126/72 | HR 54 | Temp 98.2°F | Resp 16 | Ht 67.75 in | Wt 144.0 lb

## 2016-01-08 DIAGNOSIS — Z0001 Encounter for general adult medical examination with abnormal findings: Secondary | ICD-10-CM

## 2016-01-08 DIAGNOSIS — Z86010 Personal history of colonic polyps: Secondary | ICD-10-CM | POA: Diagnosis not present

## 2016-01-08 DIAGNOSIS — Z79899 Other long term (current) drug therapy: Secondary | ICD-10-CM | POA: Diagnosis not present

## 2016-01-08 DIAGNOSIS — E782 Mixed hyperlipidemia: Secondary | ICD-10-CM

## 2016-01-08 DIAGNOSIS — Z23 Encounter for immunization: Secondary | ICD-10-CM | POA: Diagnosis not present

## 2016-01-08 DIAGNOSIS — J302 Other seasonal allergic rhinitis: Secondary | ICD-10-CM | POA: Diagnosis not present

## 2016-01-08 DIAGNOSIS — E559 Vitamin D deficiency, unspecified: Secondary | ICD-10-CM

## 2016-01-08 DIAGNOSIS — R6889 Other general symptoms and signs: Secondary | ICD-10-CM

## 2016-01-08 DIAGNOSIS — I10 Essential (primary) hypertension: Secondary | ICD-10-CM | POA: Diagnosis not present

## 2016-01-08 DIAGNOSIS — R7303 Prediabetes: Secondary | ICD-10-CM

## 2016-01-08 MED ORDER — BISOPROLOL-HYDROCHLOROTHIAZIDE 5-6.25 MG PO TABS: ORAL_TABLET | ORAL | 1 refills | Status: DC

## 2016-01-08 NOTE — Progress Notes (Signed)
WELCOME TO MEDICARE ANNUAL WELLNESS VISIT AND FOLLOW UP Assessment:    1. Need for prophylactic vaccination against Streptococcus pneumoniae (pneumococcus)  - Pneumococcal conjugate vaccine 13-valent  2. Essential hypertension -cont meds -dash diet -exercise as tolerated -monitor at home  3. Mixed hyperlipidemia -cont diet and exercise -not on medications -monitor lipid panel  4. Prediabetes -cont diet and exercise -well controlled   5. Vitamin D deficiency -cont Vit D  6. Seasonal allergic rhinitis, unspecified chronicity, unspecified trigger -cont flonase -cont zyrtec  7. Medication management -cont meds  8. History of colonic polyps -followed by GI    Over 30 minutes of exam, counseling, chart review, and critical decision making was performed  Future Appointments Date Time Provider [place]  01/21/2016 9:30 AM [facility] DX DEXA 1 [facility]  01/21/2016 10:30 AM [facility] TOMO 03 GI-BCGMM [facility]  01/28/2016 10:00 AM [facility] PREVISIT RM50 [facility]  02/13/2016 10:30 AM Gatha Mayer, MD [facility]  11/08/2016 10:00 AM Unk Pinto, MD [facility] None  12/01/2016 9:30 AM Regina Eck, CNM [place] None     Plan:   During the course of the visit the patient was educated and counseled about appropriate screening and preventive services including:    Pneumococcal vaccine   Influenza vaccine  Prevnar 13  Td vaccine  Screening electrocardiogram  Colorectal cancer screening  Diabetes screening  Glaucoma screening  Nutrition counseling    Subjective:  [name] [name] is a 65 y.o. female who presents for Medicare Annual Wellness Visit and 3 month follow up for HTN, hyperlipidemia, prediabetes, and vitamin D Def.   Her blood pressure has been controlled at home, today their BP is BP: 126/72 She does not workout. She denies chest pain, shortness of breath, dizziness.  She is on cholesterol medication and denies  myalgias. Her cholesterol is at goal. The cholesterol last visit was:   Lab Results  Component Value Date   CHOL 177 10/08/2015   HDL 62 10/08/2015   LDLCALC 92 10/08/2015   TRIG 116 10/08/2015   CHOLHDL 2.9 10/08/2015   : She has been working on diet and exercise.  She is cutting out starches and sweets.   Lab Results  Component Value Date   HGBA1C 5.6 10/08/2015   Last GFR Lab Results  Component Value Date   GFRNONAA >89 10/08/2015     Lab Results  Component Value Date   GFRAA >89 10/08/2015   Patient is on Vitamin D supplement.   Lab Results  Component Value Date   VD25OH 63 10/08/2015      Medication Review: Current Outpatient Prescriptions on File Prior to Visit  Medication Sig Dispense Refill  . aspirin 81 MG tablet Take 81 mg by mouth every other day.     . bisoprolol-hydrochlorothiazide (ZIAC) 5-6.25 MG tablet TAKE ONE TABLET BY MOUTH ONCE DAILY IN THE MORNING FOR BLOOD PRESSURE, FLUID AND PALPITATIONS (Patient taking differently: takes 1/2 tablet) 90 tablet 1  . calcium carbonate (OS-CAL) 600 MG TABS tablet Take 600 mg by mouth daily with breakfast.    . Cholecalciferol (VITAMIN D PO) Take 5,000 Units by mouth daily.     . fish oil-omega-3 fatty acids 1000 MG capsule Take 1 g by mouth 3 (three) times daily.    . Flaxseed, Linseed, (FLAXSEED OIL PO) Take by mouth 3 (three) times daily.    Marland Kitchen MAGNESIUM PO Take 400 mg by mouth daily.    . meloxicam (MOBIC) 15 MG tablet TAKE ONE TABLET  BY MOUTH ONCE DAILY AS NEEDED FOR PAIN WITH FOOD 90 tablet 1  . Multiple Vitamin (MULTIVITAMIN) tablet Take 1 tablet by mouth daily.     No current facility-administered medications on file prior to visit.     Allergies: Allergies  Allergen Reactions  . Amoxil [Amoxicillin] Rash  . 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  Screening Tests Immunization History  Administered Date(s) Administered  . Influenza Split 01/03/2013  . Influenza, High Dose Seasonal PF 10/23/2015  . PPD Test 07/13/2013  . Pneumococcal Conjugate-13 01/08/2016  . Pneumococcal-Unspecified 01/18/2002  . Tdap 01/19/2003, 07/05/2012  . Zoster 07/05/2012    Preventative care: Last colonoscopy: 2013 Mammogram: 2016 Dexa, Scheduled 2018    Prior vaccinations: TD or Tdap: 2014  Influenza: 2017 Pneumococcal: 2004 Prevnar13: 2017 Shingles/Zostavax: 2014  Names of Other Physician/Practitioners you currently use: 1. [facility] here for primary care 2. Dr. Delman Cheadle, eye doctor, last visit 2017 3. Dr.Turner and Kalman Shan , dentist, last visit 2017 Patient Care Team: Unk Pinto, MD as PCP - General (Internal Medicine)  Surgical: She  has a past surgical history that includes Cardiac surgery (1958); Colonoscopy; Cervix lesion destruction (N/A, ? years ago); and Tubal ligation. Family Her family history includes Colon cancer (age of onset: 86) in her sister; Diabetes in her brother, father, sister, and sister. Social history  She reports that she has never smoked. She has never used smokeless tobacco. She reports that she drinks about 1.8 oz of alcohol per week . She reports that she does not use drugs.  MEDICARE WELLNESS OBJECTIVES: Physical activity:   Cardiac risk factors:   Depression/mood screen:   Depression screen [facility] 2/9 01/08/2016  Decreased Interest 0  Down, Depressed, Hopeless 0  PHQ - 2 Score 0    ADLs:  In your present state of health, do you have any difficulty performing the following activities: 01/08/2016 10/08/2015  Hearing? N N  Vision? N N  Difficulty concentrating or making decisions? N N  Walking or climbing stairs? N N  Dressing or bathing? N N  Doing errands, shopping? N N   Preparing Food and eating ? N -  Using the Toilet? N -  In the past six months, have you accidently leaked urine? N -  Do you have problems with loss of bowel control? N -  Managing your Medications? N -  Managing your Finances? N -  Housekeeping or managing your Housekeeping? N -  Some recent data might be hidden     Cognitive Testing  Alert? Yes  Normal Appearance?Yes  Oriented to person? Yes  Place? Yes   Time? Yes  Recall of three objects?  Yes  Can perform simple calculations? Yes  Displays appropriate judgment?Yes  Can read the correct time from a watch face?Yes  EOL planning: Does Patient Have a Medical Advance Directive?: Yes Type of Advance Directive: Healthcare Power of Attorney, Living will   Objective:   Today's Vitals   01/08/16 0922  BP: 126/72  Pulse: (!) 54  Resp: 16  Temp: 98.2 F (36.8 C)  TempSrc: Temporal  Weight: 144 lb (65.3 kg)  Height: 5' 7.75" (1.721 m)   Body mass index is 22.06 kg/m.  General appearance: alert, no distress, WD/WN, female HEENT: normocephalic, sclerae anicteric, TMs pearly, nares patent, no  discharge or erythema, pharynx normal Oral cavity: MMM, no lesions Neck: supple, no lymphadenopathy, no thyromegaly, no masses Heart: RRR, normal S1, S2, no murmurs Lungs: CTA bilaterally, no wheezes, rhonchi, or rales Abdomen: +bs, soft, non tender, non distended, no masses, no hepatomegaly, no splenomegaly Musculoskeletal: nontender, no swelling, no obvious deformity Extremities: no edema, no cyanosis, no clubbing Pulses: 2+ symmetric, upper and lower extremities, normal cap refill Neurological: alert, oriented x 3, CN2-12 intact, strength normal upper extremities and lower extremities, sensation normal throughout, DTRs 2+ throughout, no cerebellar signs, gait normal Psychiatric: normal affect, behavior normal, pleasant   Medicare Attestation I have personally reviewed: The patient's medical and social history Their use of  alcohol, tobacco or illicit drugs Their current medications and supplements The patient's functional ability including ADLs,fall risks, home safety risks, cognitive, and hearing and visual impairment Diet and physical activities Evidence for depression or mood disorders  The patient's weight, height, BMI, and visual acuity have been recorded in the chart.  I have made referrals, counseling, and provided education to the patient based on review of the above and I have provided the patient with a written personalized care plan for preventive services.     Starlyn Skeans, PA-C   01/08/2016

## 2016-01-19 DIAGNOSIS — Z5189 Encounter for other specified aftercare: Secondary | ICD-10-CM

## 2016-01-19 HISTORY — DX: Encounter for other specified aftercare: Z51.89

## 2016-01-21 ENCOUNTER — Ambulatory Visit
Admission: RE | Admit: 2016-01-21 | Discharge: 2016-01-21 | Disposition: A | Discharge status: Home / Self Care | Payer: Medicare HMO | Source: Ambulatory Visit | Attending: Internal Medicine | Admitting: Internal Medicine

## 2016-01-21 ENCOUNTER — Ambulatory Visit
Admission: RE | Admit: 2016-01-21 | Discharge: 2016-01-21 | Disposition: A | Discharge status: Home / Self Care | Payer: Medicare HMO | Source: Ambulatory Visit | Attending: Nurse Practitioner | Admitting: Nurse Practitioner

## 2016-01-21 DIAGNOSIS — Z1231 Encounter for screening mammogram for malignant neoplasm of breast: Secondary | ICD-10-CM | POA: Diagnosis not present

## 2016-01-21 DIAGNOSIS — Z780 Asymptomatic menopausal state: Secondary | ICD-10-CM

## 2016-01-21 DIAGNOSIS — M85852 Other specified disorders of bone density and structure, left thigh: Secondary | ICD-10-CM | POA: Diagnosis not present

## 2016-01-28 ENCOUNTER — Ambulatory Visit (AMBULATORY_SURGERY_CENTER): Payer: Self-pay | Admitting: *Deleted

## 2016-01-28 VITALS — Ht 68.0 in | Wt 143.0 lb

## 2016-01-28 DIAGNOSIS — Z86010 Personal history of colonic polyps: Secondary | ICD-10-CM

## 2016-01-28 DIAGNOSIS — Z800 Family history of malignant neoplasm of digestive organs: Secondary | ICD-10-CM

## 2016-01-28 NOTE — Progress Notes (Signed)
Patient denies any allergies to eggs or soy. Patient denies any problems with anesthesia/sedation. Patient denies any oxygen use at home and does not take any diet/weight loss medications. EMMI education declined by patient.  

## 2016-01-29 ENCOUNTER — Telehealth: Payer: Self-pay

## 2016-01-29 NOTE — Telephone Encounter (Signed)
lmtcb

## 2016-01-29 NOTE — Telephone Encounter (Signed)
-----   Message from Regina Eck, CNM sent at 01/28/2016  2:31 PM EST ----- Notify patient that Bone density showed low bone mass( osteopenia) left hip only.  Degenerative changes noted in one area of spine only. She needs to make sure she is getting 4 servings of calcium foods in diet, and Vitamin D 3 OTC 1000 Iu daily. Regular weight bearing exercise such as walking . Yoga is great for flexability also. No medication needed at this point. Repeat 2 years

## 2016-01-29 NOTE — Telephone Encounter (Signed)
Patient is returning a call to Joy. °

## 2016-01-30 NOTE — Telephone Encounter (Signed)
Patient notified of results as written by provider 

## 2016-02-04 ENCOUNTER — Encounter: Payer: Self-pay | Admitting: Internal Medicine

## 2016-02-13 ENCOUNTER — Encounter: Payer: Self-pay | Admitting: Internal Medicine

## 2016-02-13 ENCOUNTER — Ambulatory Visit (AMBULATORY_SURGERY_CENTER): Payer: Medicare HMO | Admitting: Internal Medicine

## 2016-02-13 VITALS — BP 126/71 | HR 47 | Temp 97.7°F | Resp 13 | Ht 68.0 in | Wt 143.0 lb

## 2016-02-13 DIAGNOSIS — R7303 Prediabetes: Secondary | ICD-10-CM | POA: Diagnosis not present

## 2016-02-13 DIAGNOSIS — I10 Essential (primary) hypertension: Secondary | ICD-10-CM | POA: Diagnosis not present

## 2016-02-13 DIAGNOSIS — D120 Benign neoplasm of cecum: Secondary | ICD-10-CM

## 2016-02-13 DIAGNOSIS — Z86010 Personal history of colonic polyps: Secondary | ICD-10-CM

## 2016-02-13 DIAGNOSIS — K635 Polyp of colon: Secondary | ICD-10-CM

## 2016-02-13 DIAGNOSIS — K5140 Inflammatory polyps of colon without complications: Secondary | ICD-10-CM | POA: Diagnosis not present

## 2016-02-13 DIAGNOSIS — Z800 Family history of malignant neoplasm of digestive organs: Secondary | ICD-10-CM | POA: Diagnosis not present

## 2016-02-13 MED ORDER — SODIUM CHLORIDE 0.9 % IV SOLN: 500.0000 mL | INTRAVENOUS | Status: DC

## 2016-02-13 NOTE — Progress Notes (Signed)
To recovery vss report to [facility]

## 2016-02-13 NOTE — Progress Notes (Signed)
Called to room to assist during endoscopic procedure.  Patient ID and intended procedure confirmed with present staff. Received instructions for my participation in the procedure from the performing physician.  

## 2016-02-13 NOTE — Patient Instructions (Addendum)
I found and removed one polyp. There was bleeding after the removal which I treated with Epinephrine injection and tiny clips that fall off later.  I will let you know pathology results and when to have another routine colonoscopy by mail.  Stop aspirin and meloxicam for 2 weeks while things heal.  I appreciate the opportunity to care for you. Gatha Mayer, MD, FACG YOU HAD AN ENDOSCOPIC PROCEDURE TODAY AT [facility]:   Refer to the procedure report that was given to you for any specific questions about what was found during the examination.  If the procedure report does not answer your questions, please call your gastroenterologist to clarify.  If you requested that your care partner not be given the details of your procedure findings, then the procedure report has been included in a sealed envelope for you to review at your convenience later.  YOU SHOULD EXPECT: Some feelings of bloating in the abdomen. Passage of more gas than usual.  Walking can help get rid of the air that was put into your GI tract during the procedure and reduce the bloating. If you had a lower endoscopy (such as a colonoscopy or flexible sigmoidoscopy) you may notice spotting of blood in your stool or on the toilet paper. If you underwent a bowel prep for your procedure, you may not have a normal bowel movement for a few days.  Please Note:  You might notice some irritation and congestion in your nose or some drainage.  This is from the oxygen used during your procedure.  There is no need for concern and it should clear up in a day or so.  SYMPTOMS TO REPORT IMMEDIATELY:   Following lower endoscopy (colonoscopy or flexible sigmoidoscopy):  Excessive amounts of blood in the stool  Significant tenderness or worsening of abdominal pains  Swelling of the abdomen that is new, acute  Fever of 100F or higher  For urgent or emergent issues, a gastroenterologist can be reached at any hour by  calling [phone].   DIET:  We do recommend a small meal at first, but then you may proceed to your regular diet.  Drink plenty of fluids but you should avoid alcoholic beverages for 24 hours.  ACTIVITY:  You should plan to take it easy for the rest of today and you should NOT DRIVE or use heavy machinery until tomorrow (because of the sedation medicines used during the test).    FOLLOW UP: Our staff will call the number listed on your records the next business day following your procedure to check on you and address any questions or concerns that you may have regarding the information given to you following your procedure. If we do not reach you, we will leave a message.  However, if you are feeling well and you are not experiencing any problems, there is no need to return our call.  We will assume that you have returned to your regular daily activities without incident.  If any biopsies were taken you will be contacted by phone or by letter within the next 1-3 weeks.  Please call us at [phone] if you have not heard about the biopsies in 3 weeks.   Polyps (handout given) Await biopsy results to determined next repeat Colonoscopy screening NO aspirin,,ibuprofen, naproxen or other non-steriodal ant-inflammatory drugs for 2 weeks after polyps removal No Aspirin or Meloxicam for two weeks Tylenol is okay   SIGNATURES/CONFIDENTIALITY: You and/or your care partner have signed paperwork  which will be entered into your electronic medical record.  These signatures attest to the fact that that the information above on your After Visit Summary has been reviewed and is understood.  Full responsibility of the confidentiality of this discharge information lies with you and/or your care-partner.

## 2016-02-13 NOTE — Op Note (Signed)
[place] Patient Name: [name] [name] Procedure Date: 02/13/2016 10:29 AM MRN: [id number] Endoscopist: Gatha Mayer , MD Age: 66 Referring MD:  Date of Birth: 02-10-1950 Gender: Female Account #: [id number] Procedure:                Colonoscopy Indications:              Surveillance: Personal history of adenomatous                            polyps on last colonoscopy > 3 years ago, Family                            history of colon cancer in a first-degree relative Medicines:                Propofol per Anesthesia, Monitored Anesthesia Care Procedure:                Pre-Anesthesia Assessment:                           - Prior to the procedure, a History and Physical                            was performed, and patient medications and                            allergies were reviewed. The patient's tolerance of                            previous anesthesia was also reviewed. The risks                            and benefits of the procedure and the sedation                            options and risks were discussed with the patient.                            All questions were answered, and informed consent                            was obtained. Prior Anticoagulants: The patient                            last took aspirin 4 days and previous NSAID                            medication 1 day prior to the procedure. ASA Grade                            Assessment: II - A patient with mild systemic                            disease. After reviewing the risks and benefits,  the patient was deemed in satisfactory condition to                            undergo the procedure.                           After obtaining informed consent, the colonoscope                            was passed under direct vision. Throughout the                            procedure, the patient's blood pressure, pulse, and                            oxygen  saturations were monitored continuously. The                            Model CF-HQ190L [id number]) scope was introduced                            through the anus and advanced to the the cecum,                            identified by appendiceal orifice and ileocecal                            valve. The colonoscopy was performed without                            difficulty. The patient tolerated the procedure                            well. The quality of the bowel preparation was                            good. The bowel preparation used was Miralax. The                            ileocecal valve, appendiceal orifice, and rectum                            were photographed. Scope In: 10:35:32 AM Scope Out: 11:12:30 AM Scope Withdrawal Time: 0 hours 32 minutes 4 seconds  Total Procedure Duration: 0 hours 36 minutes 58 seconds  Findings:                 The perianal and digital rectal examinations were                            normal.                           A 12 mm polyp was found in the ileocecal valve. The  polyp was semi-sessile. The polyp was removed with                            a hot snare. Resection and retrieval were complete.                            Estimated blood loss: 20 mL requiring treatment                            with placement of hemostatic clip(s) x 3 + 1 that                            did not stay on site, + 5 cc 1:10K epinephrine                           A few large-mouthed diverticula were found in the                            sigmoid colon.                           The exam was otherwise without abnormality on                            direct and retroflexion views. Complications:            Minor hemorrhage - stopped spontaneously Estimated Blood Loss:     Estimated blood loss: 20 mL requiring treatment                            with placement of hemostatic clip(s) + EPI5 cc Impression:               - One 12 mm  polyp at the ileocecal valve, removed                            with a hot snare. Resected and retrieved.                           - Diverticulosis in the sigmoid colon.                           - The examination was otherwise normal on direct                            and retroflexion views.                           - Personal history of colonic polyps. Recommendation:           - Patient has a contact number available for                            emergencies. The signs and symptoms of potential  delayed complications were discussed with the                            patient. Return to normal activities tomorrow.                            Written discharge instructions were provided to the                            patient.                           - Resume previous diet.                           - No aspirin, ibuprofen, naproxen, or other                            non-steroidal anti-inflammatory drugs for 2 weeks                            after polyp removal.                           - Repeat colonoscopy is recommended for                            surveillance. The colonoscopy date will be                            determined after pathology results from today's                            exam become available for review.                           - No aspirin or meloxicam for 2 weeks - Tylenol ok Gatha Mayer, MD 02/13/2016 11:23:50 AM This report has been signed electronically.

## 2016-02-15 ENCOUNTER — Inpatient Hospital Stay (HOSPITAL_COMMUNITY)
Admission: EM | Admit: 2016-02-15 | Discharge: 2016-02-18 | DRG: 988 | Disposition: A | Discharge status: Home / Self Care | Payer: Medicare HMO | Attending: Internal Medicine | Admitting: Internal Medicine

## 2016-02-15 ENCOUNTER — Encounter (HOSPITAL_COMMUNITY): Payer: Self-pay | Admitting: Emergency Medicine

## 2016-02-15 DIAGNOSIS — I10 Essential (primary) hypertension: Secondary | ICD-10-CM | POA: Diagnosis present

## 2016-02-15 DIAGNOSIS — Z86010 Personal history of colonic polyps: Secondary | ICD-10-CM

## 2016-02-15 DIAGNOSIS — K922 Gastrointestinal hemorrhage, unspecified: Secondary | ICD-10-CM

## 2016-02-15 DIAGNOSIS — R55 Syncope and collapse: Secondary | ICD-10-CM | POA: Diagnosis not present

## 2016-02-15 DIAGNOSIS — Y838 Other surgical procedures as the cause of abnormal reaction of the patient, or of later complication, without mention of misadventure at the time of the procedure: Secondary | ICD-10-CM | POA: Diagnosis present

## 2016-02-15 DIAGNOSIS — I959 Hypotension, unspecified: Secondary | ICD-10-CM | POA: Diagnosis present

## 2016-02-15 DIAGNOSIS — K91840 Postprocedural hemorrhage and hematoma of a digestive system organ or structure following a digestive system procedure: Principal | ICD-10-CM | POA: Diagnosis present

## 2016-02-15 DIAGNOSIS — E785 Hyperlipidemia, unspecified: Secondary | ICD-10-CM | POA: Diagnosis present

## 2016-02-15 DIAGNOSIS — Z79899 Other long term (current) drug therapy: Secondary | ICD-10-CM

## 2016-02-15 DIAGNOSIS — K625 Hemorrhage of anus and rectum: Secondary | ICD-10-CM | POA: Diagnosis not present

## 2016-02-15 DIAGNOSIS — D62 Acute posthemorrhagic anemia: Secondary | ICD-10-CM | POA: Diagnosis present

## 2016-02-15 DIAGNOSIS — Z7982 Long term (current) use of aspirin: Secondary | ICD-10-CM

## 2016-02-15 DIAGNOSIS — R7303 Prediabetes: Secondary | ICD-10-CM | POA: Diagnosis present

## 2016-02-15 DIAGNOSIS — D509 Iron deficiency anemia, unspecified: Secondary | ICD-10-CM | POA: Diagnosis present

## 2016-02-15 DIAGNOSIS — E782 Mixed hyperlipidemia: Secondary | ICD-10-CM | POA: Diagnosis present

## 2016-02-15 NOTE — ED Notes (Signed)
Bed: [id number] Expected date:  Expected time:  Means of arrival:  Comments: 65yo F/ Rectal Bleeding

## 2016-02-15 NOTE — ED Provider Notes (Signed)
[facility] DEPT Provider Note   CSN: [id number] Arrival date & time: 02/15/16  2332  By signing my name below, I, Oleh Genin, attest that this documentation has been prepared under the direction and in the presence of Carlisle Cater, [place]. Electronically Signed: Oleh Genin, Scribe. 02/16/16. 12:32 AM.   History   Chief Complaint Chief Complaint  Patient presents with  . Rectal Bleeding    HPI [name] [name] is a 66 y.o. female with history of HTN, HLD, and colonic polyps who presents to the ED for evaluation of rectal bleeding. This patient was seen 2 days ago as an outpatient and underwent a colonoscopy which demonstrated a polyp and diverticulosis. The polyp was removed but there was some bleeding and was 'clipped'.  She was asymptomatic until tonight at 2200 when she had a single bowel movement with frank rectal bleeding. She states that she initial noticed blood while wiping and clots mixed with stool; thereafter she noticed "a rush of blood" which was less than a cup in volume. At interview, her only complaint is "bloating". However she denies any abdominal pain. She denies any lightheadedness or chest pain. She has no other complaints at this time. Patient is followed by Dr. Carlean Purl.  The history is provided by the patient. No language interpreter was used.    Past Medical History:  Diagnosis Date  . Heart murmur   . Hyperlipidemia   . Hypertension   . Prediabetes   . Seasonal allergies   . Vitamin D deficiency     Patient Active Problem List   Diagnosis Date Noted  . Anemia, iron deficiency 10/16/2015  . Medication management 07/26/2014  . Hyperlipidemia   . Seasonal allergies   . Vitamin D deficiency   . Prediabetes   . Hypertension   . History of colonic polyps 03/23/2011    Past Surgical History:  Procedure Laterality Date  . CARDIAC SURGERY  1958   at age 24 for hole in heart  . CERVIX LESION DESTRUCTION N/A ? years ago   with abnormal pap  .  COLONOSCOPY     polyps  . TUBAL LIGATION      OB History    Gravida Para Term Preterm AB Living   2 2 2     2    SAB TAB Ectopic Multiple Live Births                   Home Medications    Prior to Admission medications   Medication Sig Start Date End Date Taking? Authorizing Provider  aspirin 81 MG tablet Take 81 mg by mouth every other day.     Historical Provider, MD  bisoprolol-hydrochlorothiazide (ZIAC) 5-6.25 MG tablet TAKE ONE TABLET BY MOUTH ONCE DAILY IN THE MORNING FOR BLOOD PRESSURE, FLUID AND PALPITATIONS 01/08/16   Loma Sousa Forcucci, PA-C  calcium carbonate (OS-CAL) 600 MG TABS tablet Take 600 mg by mouth daily with breakfast.    Historical Provider, MD  Cholecalciferol (VITAMIN D PO) Take 5,000 Units by mouth daily.     Historical Provider, MD  fish oil-omega-3 fatty acids 1000 MG capsule Take 1 g by mouth 3 (three) times daily.    Historical Provider, MD  Flaxseed, Linseed, (FLAXSEED OIL PO) Take by mouth 3 (three) times daily.    Historical Provider, MD  MAGNESIUM PO Take 400 mg by mouth daily.    Historical Provider, MD  meloxicam (MOBIC) 15 MG tablet TAKE ONE TABLET BY MOUTH ONCE DAILY AS NEEDED FOR PAIN WITH  FOOD 11/23/15   Unk Pinto, MD  Multiple Vitamin (MULTIVITAMIN) tablet Take 1 tablet by mouth daily.    Historical Provider, MD    Family History Family History  Problem Relation Age of Onset  . Colon cancer Sister 28  . Diabetes Sister   . Diabetes Father   . Diabetes Brother   . Diabetes Sister   . Stomach cancer Neg Hx     Social History Social History  Substance Use Topics  . Smoking status: Never Smoker  . Smokeless tobacco: Never Used  . Alcohol use 1.8 oz/week    3 Standard drinks or equivalent per week     Comment: occasional wine     Allergies   Amoxil [amoxicillin] and Ampicillin   Review of Systems Review of Systems  Constitutional: Negative for fever.  HENT: Negative for rhinorrhea and sore throat.   Eyes: Negative for  redness.  Respiratory: Negative for cough.   Cardiovascular: Negative for chest pain.  Gastrointestinal: Positive for abdominal distention and blood in stool. Negative for abdominal pain, diarrhea, nausea and vomiting.       Abdominal "bloating"  Genitourinary: Negative for dysuria.  Musculoskeletal: Negative for myalgias.  Skin: Negative for rash.  Neurological: Negative for syncope and headaches.     Physical Exam Updated Vital Signs BP 122/66 (BP Location: Left Arm)   Pulse (!) 59   Temp 97.9 F (36.6 C) (Oral)   Resp 16   Ht 5\' 8"  (1.727 m)   Wt 143 lb (64.9 kg)   LMP 11/17/2002   SpO2 98%   BMI 21.74 kg/m   Physical Exam  Constitutional: She appears well-developed and well-nourished.  HENT:  Head: Normocephalic and atraumatic.  Eyes: Conjunctivae are normal. Right eye exhibits no discharge. Left eye exhibits no discharge.  Neck: Normal range of motion. Neck supple.  Cardiovascular: Normal rate, regular rhythm and normal heart sounds.   Pulmonary/Chest: Effort normal and breath sounds normal.  Abdominal: Soft. She exhibits no mass. There is no tenderness. There is no guarding.  Genitourinary: Rectal exam shows guaiac positive stool.  Genitourinary Comments: Maroon stools with gross blood  Neurological: She is alert.  Skin: Skin is warm and dry.  Psychiatric: She has a normal mood and affect.  Nursing note and vitals reviewed.    ED Treatments / Results  DIAGNOSTIC STUDIES: Oxygen Saturation is 98 percent on room air which is normal by my interpretation.    COORDINATION OF CARE: 12:26 AM Discussed treatment plan with pt at bedside and pt agreed to plan.  Labs (all labs ordered are listed, but only abnormal results are displayed) Labs Reviewed  CBC WITH DIFFERENTIAL/PLATELET - Abnormal; Notable for the following:       Result Value   RBC 3.32 (*)    Hemoglobin 10.0 (*)    HCT 29.7 (*)    All other components within normal limits  COMPREHENSIVE METABOLIC  PANEL - Abnormal; Notable for the following:    Glucose, Bld 117 (*)    BUN 21 (*)    Calcium 8.6 (*)    Total Protein 6.0 (*)    Alkaline Phosphatase 37 (*)    All other components within normal limits  POC OCCULT BLOOD, ED - Abnormal; Notable for the following:    Fecal Occult Bld POSITIVE (*)    All other components within normal limits  OCCULT BLOOD X 1 CARD TO LAB, STOOL  TYPE AND SCREEN  ABO/RH    Radiology Dg Abd Acute W/chest  Result Date: 02/16/2016 CLINICAL DATA:  Abdominal bloating after colonoscopy 2 days prior. Rectal bleeding. EXAM: DG ABDOMEN ACUTE W/ 1V CHEST COMPARISON:  None. FINDINGS: The heart is normal in size. Mild aortic tortuosity. The lungs are clear. There is no free intra-abdominal air. No dilated bowel loops to suggest obstruction. Air and small volume of stool in the colon. Three small clips overlie the colon, 2 in the region of the ascending colon in the right mid and lower abdomen, 1 in the region of the sigmoid colon in the mid pelvis. No radiopaque calculi. No acute osseous abnormalities are seen. IMPRESSION: 1. No free air.  No evidence of bowel obstruction. 2. Air and small volume of stool throughout the colon, not unexpected post colonoscopy. 3.  No acute pulmonary process. Electronically Signed   By: Jeb Levering M.D.   On: 02/16/2016 01:28    Procedures Procedures (including critical care time)  Medications Ordered in ED Medications - No data to display   Initial Impression / Assessment and Plan / ED Course  I have reviewed the triage vital signs and the nursing notes.  Pertinent labs & imaging results that were available during my care of the patient were reviewed by me and considered in my medical decision making (see chart for details).     Vital signs reviewed and are as follows: Vitals:   02/16/16 0030 02/16/16 0100  BP: 129/67 123/59  Pulse: (!) 51 (!) 51  Resp: 12 15  Temp:     Patient Discussed with and seen by Dr. Christy Gentles.  Hemoglobin baseline 12-13 ---> 10.  Spoke with Dr. Havery Moros of [facility] gastroenterology. Will admit patient. Recommends a stepdown bed if active bleeding is a concern. Recommends nothing by mouth except for bowel prep to be started after admission. Patient may need repeat colonoscopy tomorrow to identify source of bleeding.  Dr. Havery Moros reccs Moviprep for bowel prep.    Final Clinical Impressions(s) / ED Diagnoses   Final diagnoses:  Acute lower GI bleeding   Admit.  New Prescriptions Current Discharge Medication List    I personally performed the services described in this documentation, which was scribed in my presence. The recorded information has been reviewed and is accurate.    Carlisle Cater, PA-C 02/16/16 [place], PA-C 02/16/16 [place], MD 02/16/16 [phone]

## 2016-02-15 NOTE — ED Triage Notes (Signed)
Per EMS pt c/o rectal bleeding x1 day. Colonoscopy Friday for polyps. Clips placed to control bleeding stated clips would come out themselves. Bleeding began approx 1 hr ago.  Blood clots and blood noted in toilet per EMS. Pt AOx4. No other complaints.

## 2016-02-16 ENCOUNTER — Telehealth: Payer: Self-pay

## 2016-02-16 ENCOUNTER — Encounter (HOSPITAL_COMMUNITY)
Admission: EM | Disposition: A | Discharge status: Home / Self Care | Payer: Self-pay | Source: Home / Self Care | Attending: Internal Medicine

## 2016-02-16 ENCOUNTER — Inpatient Hospital Stay (HOSPITAL_COMMUNITY): Payer: Medicare HMO | Admitting: Certified Registered Nurse Anesthetist

## 2016-02-16 ENCOUNTER — Emergency Department (HOSPITAL_COMMUNITY): Payer: Medicare HMO

## 2016-02-16 ENCOUNTER — Encounter (HOSPITAL_COMMUNITY): Payer: Self-pay | Admitting: *Deleted

## 2016-02-16 DIAGNOSIS — D62 Acute posthemorrhagic anemia: Secondary | ICD-10-CM | POA: Diagnosis not present

## 2016-02-16 DIAGNOSIS — I10 Essential (primary) hypertension: Secondary | ICD-10-CM

## 2016-02-16 DIAGNOSIS — K91840 Postprocedural hemorrhage and hematoma of a digestive system organ or structure following a digestive system procedure: Secondary | ICD-10-CM | POA: Diagnosis not present

## 2016-02-16 DIAGNOSIS — K922 Gastrointestinal hemorrhage, unspecified: Secondary | ICD-10-CM

## 2016-02-16 DIAGNOSIS — R55 Syncope and collapse: Secondary | ICD-10-CM | POA: Diagnosis not present

## 2016-02-16 DIAGNOSIS — R140 Abdominal distension (gaseous): Secondary | ICD-10-CM | POA: Diagnosis not present

## 2016-02-16 DIAGNOSIS — Z79899 Other long term (current) drug therapy: Secondary | ICD-10-CM | POA: Diagnosis not present

## 2016-02-16 DIAGNOSIS — Y838 Other surgical procedures as the cause of abnormal reaction of the patient, or of later complication, without mention of misadventure at the time of the procedure: Secondary | ICD-10-CM | POA: Diagnosis not present

## 2016-02-16 DIAGNOSIS — E785 Hyperlipidemia, unspecified: Secondary | ICD-10-CM | POA: Diagnosis not present

## 2016-02-16 DIAGNOSIS — Z86010 Personal history of colonic polyps: Secondary | ICD-10-CM

## 2016-02-16 DIAGNOSIS — Z7982 Long term (current) use of aspirin: Secondary | ICD-10-CM | POA: Diagnosis not present

## 2016-02-16 DIAGNOSIS — D509 Iron deficiency anemia, unspecified: Secondary | ICD-10-CM | POA: Diagnosis not present

## 2016-02-16 DIAGNOSIS — R7303 Prediabetes: Secondary | ICD-10-CM | POA: Diagnosis not present

## 2016-02-16 DIAGNOSIS — I959 Hypotension, unspecified: Secondary | ICD-10-CM | POA: Diagnosis not present

## 2016-02-16 DIAGNOSIS — I509 Heart failure, unspecified: Secondary | ICD-10-CM | POA: Diagnosis not present

## 2016-02-16 HISTORY — PX: COLONOSCOPY: SHX5424

## 2016-02-16 HISTORY — DX: Gastrointestinal hemorrhage, unspecified: K92.2

## 2016-02-16 LAB — COMPREHENSIVE METABOLIC PANEL
ALT: 35 U/L (ref 14–54)
AST: 25 U/L (ref 15–41)
Albumin: 3.8 g/dL (ref 3.5–5.0)
Alkaline Phosphatase: 37 U/L — ABNORMAL LOW (ref 38–126)
Anion gap: 5 (ref 5–15)
BUN: 21 mg/dL — ABNORMAL HIGH (ref 6–20)
CO2: 27 mmol/L (ref 22–32)
Calcium: 8.6 mg/dL — ABNORMAL LOW (ref 8.9–10.3)
Chloride: 104 mmol/L (ref 101–111)
Creatinine, Ser: 0.75 mg/dL (ref 0.44–1.00)
GFR calc Af Amer: >60 mL/min (ref >60)
GFR calc non Af Amer: >60 mL/min (ref >60)
Glucose, Bld: 117 mg/dL — ABNORMAL HIGH (ref 65–99)
Potassium: 4 mmol/L (ref 3.5–5.1)
Sodium: 136 mmol/L (ref 135–145)
Total Bilirubin: 0.3 mg/dL (ref 0.3–1.2)
Total Protein: 6 g/dL — ABNORMAL LOW (ref 6.5–8.1)

## 2016-02-16 LAB — CBC WITH DIFFERENTIAL/PLATELET
Basophils Absolute: 0 10*3/uL (ref 0.0–0.1)
Basophils Relative: 0 %
Eosinophils Absolute: 0.1 10*3/uL (ref 0.0–0.7)
Eosinophils Relative: 2 %
HCT: 29.7 % — ABNORMAL LOW (ref 36.0–46.0)
Hemoglobin: 10 g/dL — ABNORMAL LOW (ref 12.0–15.0)
Lymphocytes Relative: 22 %
Lymphs Abs: 1.2 10*3/uL (ref 0.7–4.0)
MCH: 30.1 pg (ref 26.0–34.0)
MCHC: 33.7 g/dL (ref 30.0–36.0)
MCV: 89.5 fL (ref 78.0–100.0)
Monocytes Absolute: 0.3 10*3/uL (ref 0.1–1.0)
Monocytes Relative: 6 %
Neutro Abs: 3.9 10*3/uL (ref 1.7–7.7)
Neutrophils Relative %: 70 %
Platelets: 182 10*3/uL (ref 150–400)
RBC: 3.32 MIL/uL — ABNORMAL LOW (ref 3.87–5.11)
RDW: 12.1 % (ref 11.5–15.5)
WBC: 5.6 10*3/uL (ref 4.0–10.5)

## 2016-02-16 LAB — BASIC METABOLIC PANEL
Anion gap: 9 (ref 5–15)
BUN: 20 mg/dL (ref 6–20)
CO2: 26 mmol/L (ref 22–32)
Calcium: 8.8 mg/dL — ABNORMAL LOW (ref 8.9–10.3)
Chloride: 105 mmol/L (ref 101–111)
Creatinine, Ser: 0.65 mg/dL (ref 0.44–1.00)
GFR calc Af Amer: >60 mL/min (ref >60)
GFR calc non Af Amer: >60 mL/min (ref >60)
Glucose, Bld: 120 mg/dL — ABNORMAL HIGH (ref 65–99)
Potassium: 4.1 mmol/L (ref 3.5–5.1)
Sodium: 140 mmol/L (ref 135–145)

## 2016-02-16 LAB — ABO/RH: ABO/RH(D): A POS

## 2016-02-16 LAB — CBC
HCT: 29.3 % — ABNORMAL LOW (ref 36.0–46.0)
HCT: 26.1 % — ABNORMAL LOW (ref 36.0–46.0)
HCT: 22.5 % — ABNORMAL LOW (ref 36.0–46.0)
Hemoglobin: 10 g/dL — ABNORMAL LOW (ref 12.0–15.0)
Hemoglobin: 9 g/dL — ABNORMAL LOW (ref 12.0–15.0)
Hemoglobin: 7.7 g/dL — ABNORMAL LOW (ref 12.0–15.0)
MCH: 31 pg (ref 26.0–34.0)
MCH: 31.5 pg (ref 26.0–34.0)
MCH: 30.6 pg (ref 26.0–34.0)
MCHC: 34.1 g/dL (ref 30.0–36.0)
MCHC: 34.5 g/dL (ref 30.0–36.0)
MCHC: 34.2 g/dL (ref 30.0–36.0)
MCV: 90.7 fL (ref 78.0–100.0)
MCV: 91.3 fL (ref 78.0–100.0)
MCV: 89.3 fL (ref 78.0–100.0)
Platelets: 185 10*3/uL (ref 150–400)
Platelets: 175 10*3/uL (ref 150–400)
Platelets: 153 10*3/uL (ref 150–400)
RBC: 3.23 MIL/uL — ABNORMAL LOW (ref 3.87–5.11)
RBC: 2.86 MIL/uL — ABNORMAL LOW (ref 3.87–5.11)
RBC: 2.52 MIL/uL — ABNORMAL LOW (ref 3.87–5.11)
RDW: 12.1 % (ref 11.5–15.5)
RDW: 12.4 % (ref 11.5–15.5)
RDW: 12.3 % (ref 11.5–15.5)
WBC: 6.9 10*3/uL (ref 4.0–10.5)
WBC: 6.7 10*3/uL (ref 4.0–10.5)
WBC: 6.6 10*3/uL (ref 4.0–10.5)

## 2016-02-16 LAB — PROTIME-INR
INR: 1.17
Prothrombin Time: 15 seconds (ref 11.4–15.2)

## 2016-02-16 LAB — APTT: aPTT: 27 seconds (ref 24–36)

## 2016-02-16 LAB — MRSA PCR SCREENING: MRSA by PCR: NEGATIVE

## 2016-02-16 LAB — HEMOGLOBIN AND HEMATOCRIT, BLOOD
HCT: 21.9 % — ABNORMAL LOW (ref 36.0–46.0)
Hemoglobin: 7.4 g/dL — ABNORMAL LOW (ref 12.0–15.0)

## 2016-02-16 LAB — POC OCCULT BLOOD, ED: Fecal Occult Bld: POSITIVE — AB

## 2016-02-16 SURGERY — COLONOSCOPY
Anesthesia: Monitor Anesthesia Care

## 2016-02-16 MED ORDER — SODIUM CHLORIDE 0.9 % IV BOLUS (SEPSIS)
500.0000 mL | Freq: Once | INTRAVENOUS | Status: AC
  Administered 2016-02-16: 500 mL via INTRAVENOUS

## 2016-02-16 MED ORDER — ONDANSETRON HCL 4 MG/2ML IJ SOLN
INTRAMUSCULAR | Status: DC | PRN
  Administered 2016-02-16: 4 mg via INTRAVENOUS

## 2016-02-16 MED ORDER — PROPOFOL 10 MG/ML IV BOLUS
INTRAVENOUS | Status: AC
  Filled 2016-02-16: qty 40

## 2016-02-16 MED ORDER — ACETAMINOPHEN 650 MG RE SUPP: 650.0000 mg | Freq: Four times a day (QID) | RECTAL | Status: DC | PRN

## 2016-02-16 MED ORDER — LIDOCAINE 2% (20 MG/ML) 5 ML SYRINGE
INTRAMUSCULAR | Status: AC
  Filled 2016-02-16: qty 5

## 2016-02-16 MED ORDER — ONDANSETRON HCL 4 MG PO TABS: 4.0000 mg | ORAL_TABLET | Freq: Four times a day (QID) | ORAL | Status: DC | PRN

## 2016-02-16 MED ORDER — SODIUM CHLORIDE 0.9 % IV SOLN: INTRAVENOUS | Status: DC

## 2016-02-16 MED ORDER — SODIUM CHLORIDE 0.9% FLUSH
3.0000 mL | Freq: Two times a day (BID) | INTRAVENOUS | Status: DC
  Administered 2016-02-16: 3 mL via INTRAVENOUS

## 2016-02-16 MED ORDER — BISOPROLOL FUMARATE 5 MG PO TABS: 2.5000 mg | ORAL_TABLET | Freq: Every day | ORAL | Status: DC

## 2016-02-16 MED ORDER — HYDRALAZINE HCL 20 MG/ML IJ SOLN: 5.0000 mg | INTRAMUSCULAR | Status: DC | PRN

## 2016-02-16 MED ORDER — SODIUM CHLORIDE 0.9 % IJ SOLN
PREFILLED_SYRINGE | INTRAMUSCULAR | Status: DC | PRN
  Administered 2016-02-16: 7 mL

## 2016-02-16 MED ORDER — EPHEDRINE SULFATE 50 MG/ML IJ SOLN
INTRAMUSCULAR | Status: DC | PRN
  Administered 2016-02-16: 10 mg via INTRAVENOUS

## 2016-02-16 MED ORDER — PEG-KCL-NACL-NASULF-NA ASC-C 100 G PO SOLR
1.0000 | Freq: Once | ORAL | Status: AC
  Administered 2016-02-16: 200 g via ORAL
  Filled 2016-02-16: qty 1

## 2016-02-16 MED ORDER — ZOLPIDEM TARTRATE 5 MG PO TABS: 5.0000 mg | ORAL_TABLET | Freq: Every evening | ORAL | Status: DC | PRN

## 2016-02-16 MED ORDER — EPHEDRINE 5 MG/ML INJ
INTRAVENOUS | Status: AC
  Filled 2016-02-16: qty 10

## 2016-02-16 MED ORDER — EPINEPHRINE PF 1 MG/10ML IJ SOSY
PREFILLED_SYRINGE | INTRAMUSCULAR | Status: AC
  Filled 2016-02-16: qty 10

## 2016-02-16 MED ORDER — PROPOFOL 500 MG/50ML IV EMUL
INTRAVENOUS | Status: DC | PRN
  Administered 2016-02-16: 125 ug/kg/min via INTRAVENOUS

## 2016-02-16 MED ORDER — SODIUM CHLORIDE 0.9 % IV SOLN: Freq: Once | INTRAVENOUS | Status: DC

## 2016-02-16 MED ORDER — PANTOPRAZOLE SODIUM 40 MG IV SOLR
40.0000 mg | Freq: Two times a day (BID) | INTRAVENOUS | Status: DC
  Administered 2016-02-16: 40 mg via INTRAVENOUS
  Filled 2016-02-16: qty 40

## 2016-02-16 MED ORDER — SODIUM CHLORIDE 0.9 % IV SOLN
INTRAVENOUS | Status: DC
  Administered 2016-02-16 – 2016-02-18 (×4): via INTRAVENOUS

## 2016-02-16 MED ORDER — ACETAMINOPHEN 325 MG PO TABS: 650.0000 mg | ORAL_TABLET | Freq: Four times a day (QID) | ORAL | Status: DC | PRN

## 2016-02-16 MED ORDER — ONDANSETRON HCL 4 MG/2ML IJ SOLN: 4.0000 mg | Freq: Four times a day (QID) | INTRAMUSCULAR | Status: DC | PRN

## 2016-02-16 MED ORDER — LACTATED RINGERS IV SOLN
INTRAVENOUS | Status: DC
  Administered 2016-02-16: 1000 mL via INTRAVENOUS

## 2016-02-16 MED ORDER — PROPOFOL 10 MG/ML IV BOLUS
INTRAVENOUS | Status: AC
  Filled 2016-02-16: qty 20

## 2016-02-16 MED ORDER — ONDANSETRON HCL 4 MG/2ML IJ SOLN
INTRAMUSCULAR | Status: AC
  Filled 2016-02-16: qty 2

## 2016-02-16 NOTE — H&P (View-Only) (Signed)
Consultation  Referring Provider:     Dr. Thereasa Solo Primary Care Physician:  [name], MD Primary Gastroenterologist:  Dr. Silvano Rusk       Reason for Consultation:  Hematochezia          HPI:   [name] [name] is a 66 y.o. female with a past medical history positive for hypertension, hyperlipidemia and colon polyps who presented to the ER the night of 02/15/16 with complaint of hematochezia. The patient just underwent a surveillance colonoscopy done by Dr. Carlean Purl on 02/13/16 with findings of polyps and diverticulosis, including a 12 mm polyp that was resected and retrieved from the ileocecal valve, it was noted that the polyp was semi-sessile and was removed with a hot snare with an estimated blood loss of 20 mL,  requiring treatment with placement of hemostatic clips 3+1 that did not stay on site +5 mL of epinephrine.   Today, the patient tells me that she was doing well after her colonoscopy until last night around 10:30 when she felt like she had have a bowel movement and went to the bathroom and when she wiped saw a lot of bright red blood. She then looked in the commode and saw a mixture of bright red and darker looking blood. She tells me she had one other bowel movement with "gushing of bright red blood", and they called the ambulance. She was brought to the ER.     Overnight patient has been drinking a colonoscopy prep and tells me that she finished this around 7 AM or so, but is still having production of some bright red blood which seems more diluted in nature. Her husband and daughter are by her bedside and are quite worried about her situation. Initially the patient describes some bloating and abdominal discomfort which has gotten some better after all of the bowel prep. Patient did have one episode of fainting during the early hours of the morning when returning to her bed from the toilet per nursing staff.   Patient denies fever, chills, nausea or vomiting.  ED Course:  pt was found to have positive FOBT, hemoglobin dropped from 13.0 on 10/08/15-->10.0, heart rate 51, no tachypnea, O2 sat 96% on room air, electrolytes renal function okay. X-ray of acute abdomen/chest was negative. Patient was admitted to stepdown as inpatient as recommended by GI, Dr. Enis Gash.  Past Medical History:  Diagnosis Date  . Heart murmur   . Hyperlipidemia   . Hypertension   . Prediabetes   . Seasonal allergies   . Vitamin D deficiency     Past Surgical History:  Procedure Laterality Date  . CARDIAC SURGERY  1958   at age 20 for hole in heart  . CERVIX LESION DESTRUCTION N/A ? years ago   with abnormal pap  . COLONOSCOPY     polyps  . TUBAL LIGATION      Family History  Problem Relation Age of Onset  . Colon cancer Sister 67  . Diabetes Sister   . Diabetes Father   . Diabetes Brother   . Diabetes Sister   . Stomach cancer Neg Hx     Social History  Substance Use Topics  . Smoking status: Never Smoker  . Smokeless tobacco: Never Used  . Alcohol use 1.8 oz/week    3 Standard drinks or equivalent per week     Comment: occasional wine    Prior to Admission medications   Medication Sig Start Date End Date Taking? Authorizing Provider  aspirin 81 MG tablet Take 81 mg by mouth every other day.    Yes Historical Provider, MD  bisoprolol-hydrochlorothiazide (ZIAC) 5-6.25 MG tablet TAKE ONE TABLET BY MOUTH ONCE DAILY IN THE MORNING FOR BLOOD PRESSURE, FLUID AND PALPITATIONS Patient taking differently: Take 0.5 tablets by mouth every morning.  01/08/16  Yes Courtney Forcucci, PA-C  calcium carbonate (OS-CAL) 600 MG TABS tablet Take 600 mg by mouth daily with breakfast.   Yes Historical Provider, MD  Cholecalciferol (VITAMIN D PO) Take 5,000 Units by mouth every morning.    Yes Historical Provider, MD  fish oil-omega-3 fatty acids 1000 MG capsule Take 1 g by mouth 3 (three) times daily with meals.    Yes Historical Provider, MD  Flaxseed, Linseed, (FLAXSEED OIL PO)  Take 1 tablet by mouth 3 (three) times daily with meals.    Yes Historical Provider, MD  MAGNESIUM PO Take 400 mg by mouth every morning.    Yes Historical Provider, MD  Multiple Vitamin (MULTIVITAMIN) tablet Take 1 tablet by mouth every morning.    Yes Historical Provider, MD    Current Facility-Administered Medications  Medication Dose Route Frequency Provider Last Rate Last Dose  . 0.9 %  sodium chloride infusion   Intravenous Continuous Cherene Altes, MD 125 mL/hr at 02/16/16 [phone]    . acetaminophen (TYLENOL) tablet 650 mg  650 mg Oral Q6H PRN Ivor Costa, MD       Or  . acetaminophen (TYLENOL) suppository 650 mg  650 mg Rectal Q6H PRN Ivor Costa, MD      . bisoprolol (ZEBETA) tablet 2.5 mg  2.5 mg Oral Daily Ivor Costa, MD      . hydrALAZINE (APRESOLINE) injection 5 mg  5 mg Intravenous Q2H PRN Ivor Costa, MD      . ondansetron [facility]) tablet 4 mg  4 mg Oral Q6H PRN Ivor Costa, MD       Or  . ondansetron [facility]) injection 4 mg  4 mg Intravenous Q6H PRN Ivor Costa, MD      . sodium chloride flush (NS) 0.9 % injection 3 mL  3 mL Intravenous Q12H Ivor Costa, MD      . zolpidem (AMBIEN) tablet 5 mg  5 mg Oral QHS PRN Ivor Costa, MD        Allergies as of 02/15/2016 - Review Complete 02/15/2016  Allergen Reaction Noted  . Amoxil [amoxicillin] Rash 01/02/2013  . Ampicillin Rash 03/09/2011     Review of Systems:    Constitutional: Positive for fatigue No weight loss, fever or chills Skin: No rash  Cardiovascular: No chest pain Respiratory: No SOB  Gastrointestinal: See HPI and otherwise negative Genitourinary: No dysuria or change in urinary frequency Neurological: Positive for dizziness and syncope Musculoskeletal: No new muscle or joint pain Hematologic: No bruising Psychiatric: No history of depression or anxiety   Physical Exam:  Vital signs in last 24 hours: Temp:  [96.3 F (35.7 C)-98.2 F (36.8 C)] 96.3 F (35.7 C) (01/29 0800) Pulse Rate:  [51-73] 73 (01/29  0525) Resp:  [12-19] 19 (01/29 0600) BP: (116-130)/(53-67) 116/53 (01/29 0525) SpO2:  [96 %-99 %] 99 % (01/29 0600) Weight:  [143 lb (64.9 kg)-143 lb 1.3 oz (64.9 kg)] 143 lb 1.3 oz (64.9 kg) (01/29 0330) Last BM Date: 02/16/16 General:   Pleasant Caucasian female appears to be in NAD, Well developed, Well nourished, alert and cooperative Head:  Normocephalic and atraumatic. Eyes:   PEERL, EOMI. No icterus. Conjunctiva pink. Ears:  Normal  auditory acuity. Neck:  Supple Throat: Oral cavity and pharynx without inflammation, swelling or lesion. Teeth in good condition. Lungs: Respirations even and unlabored. Lungs clear to auscultation bilaterally.   No wheezes, crackles, or rhonchi.  Heart: Normal S1, S2. No MRG. Regular rate and rhythm. No peripheral edema, cyanosis or pallor.  Abdomen:  Soft, nondistended, nontender. No rebound or guarding. Normal bowel sounds. No appreciable masses or hepatomegaly. Rectal:  Not performed. (visualization of bed sheet reveal diluted brb)  Msk:  Symmetrical without gross deformities. Peripheral pulses intact.  Extremities:  Without edema, no deformity or joint abnormality.  Neurologic:  Alert and  oriented x4;  grossly normal neurologically  Skin:   Dry and intact without significant lesions or rashes. Psychiatric: Demonstrates good judgement and reason without abnormal affect or behaviors.   LAB RESULTS:  Recent Labs  02/16/16 0036 02/16/16 0332  WBC 5.6 6.9  HGB 10.0* 10.0*  HCT 29.7* 29.3*  PLT 182 185   BMET  Recent Labs  02/16/16 0036 02/16/16 0332  NA 136 140  K 4.0 4.1  CL 104 105  CO2 27 26  GLUCOSE 117* 120*  BUN 21* 20  CREATININE 0.75 0.65  CALCIUM 8.6* 8.8*   LFT  Recent Labs  02/16/16 0036  PROT 6.0*  ALBUMIN 3.8  AST 25  ALT 35  ALKPHOS 37*  BILITOT 0.3   PT/INR  Recent Labs  02/16/16 0332  LABPROT 15.0  INR 1.17    STUDIES: Dg Abd Acute W/chest  Result Date: 02/16/2016 CLINICAL DATA:  Abdominal  bloating after colonoscopy 2 days prior. Rectal bleeding. EXAM: DG ABDOMEN ACUTE W/ 1V CHEST COMPARISON:  None. FINDINGS: The heart is normal in size. Mild aortic tortuosity. The lungs are clear. There is no free intra-abdominal air. No dilated bowel loops to suggest obstruction. Air and small volume of stool in the colon. Three small clips overlie the colon, 2 in the region of the ascending colon in the right mid and lower abdomen, 1 in the region of the sigmoid colon in the mid pelvis. No radiopaque calculi. No acute osseous abnormalities are seen. IMPRESSION: 1. No free air.  No evidence of bowel obstruction. 2. Air and small volume of stool throughout the colon, not unexpected post colonoscopy. 3.  No acute pulmonary process. Electronically Signed   By: Jeb Levering M.D.   On: 02/16/2016 01:28     PREVIOUS ENDOSCOPIES:            See HPI-recent colonoscopy   Impression / Plan:   Impression: 1. Hematochezia: Patient started with bright red blood per rectum yesterday around 10:30 PM, has continued with prep overnight, patient is status post colonoscopy with removal of 12 mm polyp from the ileocecum on Friday with Dr. Carlean Purl, likely this represents a post-polypectomy bleed 2. Acute blood loss anemia: Hemoglobin typically around 13, dropped to 10, awaiting next blood draw  Plan: 1. Patient remained nothing by mouth overnight and underwent bowel prep and is scheduled for a colonoscopy this morning around 11 AM with Dr. Fuller Plan. We did discuss risks, benefits, limitations alternatives the patient agrees to proceed. 2. Patient to remain nothing by mouth until after time of procedure 3. Did discuss the nature of post polypectomy bleeds with the family and the patient, tried to reassure them and answer questions 4. Continue other supportive measures including monitoring of hemoglobin with transfusion less than 7 5. Case was discussed with Dr. Fuller Plan, please await any further recommendations after time  of procedure  this morning  Thank you for your kind consultation, we will continue to follow.  Lavone Nian Lemmon  02/16/2016, 9:18 AM Pager #: [phone]     Attending physician's note   I have taken a history, examined the patient and reviewed the chart. I agree with the Advanced Practitioner's note, impression and recommendations.  Hematochezia starting late last night is very likely due to a post polypectomy bleed from the IC valve polyp site. She has continued to bleed with bowel prep over night. Hb has dropped from 13 to 10. This site bleed immediately after polypectomy and was treated with epi injection and 2 clips with hemostasis. Urgent colonoscopy today.   Lucio Edward, MD [facility] [phone] Mon-Fri 8a-5p [phone] after 5p, weekends, holidays

## 2016-02-16 NOTE — Anesthesia Postprocedure Evaluation (Addendum)
Anesthesia Post Note  Patient: [name] [name]  Procedure(s) Performed: Procedure(s) (LRB): COLONOSCOPY (N/A)  Patient location during evaluation: Endoscopy Anesthesia Type: MAC Level of consciousness: awake and alert Pain management: pain level controlled Vital Signs Assessment: post-procedure vital signs reviewed and stable Respiratory status: spontaneous breathing, nonlabored ventilation, respiratory function stable and patient connected to nasal cannula oxygen Cardiovascular status: stable and blood pressure returned to baseline Anesthetic complications: no       Last Vitals:  Vitals:   02/16/16 1014 02/16/16 1015  BP:  (!) 108/41  Pulse:    Resp: 16   Temp: 36.6 C     Last Pain:  Vitals:   02/16/16 1014  TempSrc: Oral                 Celestino Ackerman,JAMES TERRILL

## 2016-02-16 NOTE — Telephone Encounter (Signed)
Left message on answering machine. 

## 2016-02-16 NOTE — Anesthesia Preprocedure Evaluation (Signed)
Anesthesia Evaluation  Patient identified by MRN, date of birth, ID band Patient awake    Reviewed: Allergy & Precautions, NPO status   Airway Mallampati: II  TM Distance: >3 FB Neck ROM: Full    Dental  (+) Teeth Intact   Pulmonary neg pulmonary ROS,    breath sounds clear to auscultation       Cardiovascular hypertension,  Rhythm:Regular Rate:Normal     Neuro/Psych    GI/Hepatic GI bleed   Endo/Other    Renal/GU      Musculoskeletal   Abdominal   Peds  Hematology  (+) anemia , Has been bleeding Gi   Anesthesia Other Findings   Reproductive/Obstetrics                             Anesthesia Physical Anesthesia Plan  ASA: II  Anesthesia Plan: MAC   Post-op Pain Management:    Induction: Intravenous  Airway Management Planned: Natural Airway and Nasal Cannula  Additional Equipment:   Intra-op Plan:   Post-operative Plan:   Informed Consent: I have reviewed the patients History and Physical, chart, labs and discussed the procedure including the risks, benefits and alternatives for the proposed anesthesia with the patient or authorized representative who has indicated his/her understanding and acceptance.   Dental advisory given  Plan Discussed with:   Anesthesia Plan Comments:         Anesthesia Quick Evaluation

## 2016-02-16 NOTE — Progress Notes (Signed)
[name] [name]  [id number] DOB: [birth year] DOA: 02/15/2016 PCP: [name], MD    Brief Narrative:  66 y.o. female with history of hypertension, hyperlipidemia, and colon polyps who presented with rectal bleeding.  She underwent a surveillance colonoscopy by Dr. Carlean Purl on 02/13/16, was found to have a polyp and diverticulosis, and the polyp was removed.  There was some bleeding at that time and the area was injectied w/ epi and 'clipped'. She had been doing well until she had a bowel movement with frank rectal bleeding with a blood clot, followed by another episode of rectal bleeding.  Pt stopped taking ASA and mobic two weeks prior.  In the ED hemoglobin was noted to have dropped from 13.0 on 10/08/15 >10.0.  Patient was admitted to stepdown and discussed w/ GI, Dr. Enis Gash.  Subjective: Pt is seen for a f/u visit.  This morning she passed a large bowel movement and immediately following this had a syncopal spell.  She rapidly recovered.  She is now fully alert and oriented and conversant.  She denies any pain whatsoever.  She denies lightheadedness or dizziness when laying in bed.  Assessment & Plan:  Lower GI bleeding post polypectomy GI ([facility]) was consulted and is to perform f/u colonoscopy this morning reportedly   Anemia due to acute blood loss Follow trend - no indication for transfusion at present - given syncopal spell will repeat now and then transition to Q12hr checks if stable   Recent Labs Lab 02/16/16 0036 02/16/16 0332  HGB 10.0* 10.0*    HLD Last LDL was 92 on 10/08/15 - not taking meds now - f/u with PCP  HTN BP stable - follow    DVT prophylaxis: SCDs Code Status: FULL CODE Family Communication: no family present at time of exam  Disposition Plan: SDU until colonoscopy completed and Hgb proven to be stable   Consultants:  [facility]   Procedures: Colonoscopy - pending   Antimicrobials:  none  Objective: Blood pressure (!)  116/53, pulse 73, temperature 98.2 F (36.8 C), temperature source Oral, resp. rate 14, height 5\' 8"  (1.727 m), weight 64.9 kg (143 lb 1.3 oz), last menstrual period 11/17/2002, SpO2 99 %. No intake or output data in the 24 hours ending 02/16/16 0754 Filed Weights   02/15/16 2352 02/16/16 0330  Weight: 64.9 kg (143 lb) 64.9 kg (143 lb 1.3 oz)    Examination: Pt was seen for a f/u visit.    CBC:  Recent Labs Lab 02/16/16 0036 02/16/16 0332  WBC 5.6 6.9  NEUTROABS 3.9  --   HGB 10.0* 10.0*  HCT 29.7* 29.3*  MCV 89.5 90.7  PLT 182 [id number]   Basic Metabolic Panel:  Recent Labs Lab 02/16/16 0036 02/16/16 0332  NA 136 140  K 4.0 4.1  CL 104 105  CO2 27 26  GLUCOSE 117* 120*  BUN 21* 20  CREATININE 0.75 0.65  CALCIUM 8.6* 8.8*   GFR: Estimated Creatinine Clearance: 70.7 mL/min (by C-G formula based on SCr of 0.65 mg/dL).  Liver Function Tests:  Recent Labs Lab 02/16/16 0036  AST 25  ALT 35  ALKPHOS 37*  BILITOT 0.3  PROT 6.0*  ALBUMIN 3.8   Coagulation Profile:  Recent Labs Lab 02/16/16 0332  INR 1.17    HbA1C: Hgb A1c MFr Bld  Date/Time Value Ref Range Status  10/08/2015 11:32 AM 5.6 <5.7 % Final    Comment:      For the purpose of screening for the presence of  diabetes:   <5.7%       Consistent with the absence of diabetes 5.7-6.4 %   Consistent with increased risk for diabetes (prediabetes) >=6.5 %     Consistent with diabetes   This assay result is consistent with a decreased risk of diabetes.   Currently, no consensus exists regarding use of hemoglobin A1c for diagnosis of diabetes in children.   According to American Diabetes Association (ADA) guidelines, hemoglobin A1c <7.0% represents optimal control in non-pregnant diabetic patients. Different metrics may apply to specific patient populations. Standards of Medical Care in Diabetes (ADA).     07/26/2014 11:38 AM 5.9 (H) <5.7 % Final    Comment:                                                                            According to the ADA Clinical Practice Recommendations for 2011, when HbA1c is used as a screening test:     >=6.5%   Diagnostic of Diabetes Mellitus            (if abnormal result is confirmed)   5.7-6.4%   Increased risk of developing Diabetes Mellitus   References:Diagnosis and Classification of Diabetes Mellitus,Diabetes [id number] 1):S62-S69 and Standards of Medical Care in         Diabetes - 2011,Diabetes [id number] (Suppl 1):S11-S61.        Recent Results (from the past 240 hour(s))  MRSA PCR Screening     Status: None   Collection Time: 02/16/16  5:13 AM  Result Value Ref Range Status   MRSA by PCR NEGATIVE NEGATIVE Final    Comment:        The GeneXpert MRSA Assay (FDA approved for NASAL specimens only), is one component of a comprehensive MRSA colonization surveillance program. It is not intended to diagnose MRSA infection nor to guide or monitor treatment for MRSA infections.      Scheduled Meds: . bisoprolol  2.5 mg Oral Daily  . pantoprazole (PROTONIX) IV  40 mg Intravenous Q12H  . sodium chloride flush  3 mL Intravenous Q12H   Continuous Infusions: . sodium chloride 100 mL/hr at 02/16/16 0400     LOS: 0 days   Time spent: No Charge  Cherene Altes, MD [facility] Office  [phone] Pager - Text Page per Shea Evans as per below:  On-Call/Text Page:      [email or web address]      password [email or web address]  If 7PM-7AM, please contact night-coverage [email or web address] Password [facility] 02/16/2016, 7:54 AM

## 2016-02-16 NOTE — Progress Notes (Addendum)
   02/16/16 0840  Vitals  BP (!) 93/40  MAP (mmHg) (!) 57  BP Location Left Arm  BP Method Automatic  Patient Position (if appropriate) Lying  Pulse Rate (!) 51  Pulse Rate Source Monitor  ECG Heart Rate (!) 53  Cardiac Rhythm SB   Called to patient room by family for syncopal episode. Per family, patient was about to get up to bedside commode when she became dizzy and laid back across bed. No fall to floor or injury noted. Patient easily recovered. See above for vital signs. MD paged and order given for 500cc bolus. MD also informed that patient passing bright red blood per rectum. Will continue to monitor.

## 2016-02-16 NOTE — H&P (Signed)
History and Physical    [name] [name] [id number] DOB: [birth year] DOA: 02/15/2016  Referring MD/NP/PA:   PCP: [name], MD   Patient coming from:  The patient is coming from home.  At baseline, pt is independent for most of ADL.   Chief Complaint:   HPI: [name] [name] is a 66 y.o. female with medical history significant of hypertension, hyperlipidemia, colon polyps, who presents with rectal bleeding.  Pt states that she underwent colonoscopy by Dr. Carlean Purl 2 days ago. Per report, she was found to have a polyp and diverticulosis. The polyp was removed but there was some bleeding and was 'clipped'. She has been doing well until last night at 10:00 PM, when had a bowel movement with frank rectal bleeding with blood clot. Later on, she had another episode of rectal bleeding, described as "a rush of blood" which was less than a cup in volume. Pt stated that she has mild abdominal bloating and discomfort, but denies nausea, vomiting, abdominal pain. No lightheadedness or dizziness. Denies chest pain, SOB, fever or chills. No symptoms of UTI or unilateral weakness. Pt states that she stopped taking ASA and mobic two weeks ago.  ED Course: pt was found to have positive FOBT, hemoglobin dropped from 13.0 on 10/08/15-->10.0, heart rate 51, no tachypnea, O2 sat 96% on room air, electrolytes renal function okay. X-ray of acute abdomen/chest is negative. Patient is admitted to stepdown as inpatient as recommended by GI, Dr. Enis Gash.  Review of Systems:   General: no fevers, chills, no changes in body weight, has fatigue HEENT: no blurry vision, hearing changes or sore throat Respiratory: no dyspnea, coughing, wheezing CV: no chest pain, no palpitations GI: no nausea, vomiting, abdominal pain, diarrhea, constipation. Has rectal bleeding. GU: no dysuria, burning on urination, increased urinary frequency, hematuria  Ext: no leg edema Neuro: no unilateral weakness, numbness, or tingling,  no vision change or hearing loss Skin: no rash, no skin tear. MSK: No muscle spasm, no deformity, no limitation of range of movement in spin Heme: No easy bruising.  Travel history: No recent long distant travel.  Allergy:  Allergies  Allergen Reactions  . Amoxil [Amoxicillin] Rash  . Ampicillin Rash    Past Medical History:  Diagnosis Date  . Heart murmur   . Hyperlipidemia   . Hypertension   . Prediabetes   . Seasonal allergies   . Vitamin D deficiency     Past Surgical History:  Procedure Laterality Date  . CARDIAC SURGERY  1958   at age 69 for hole in heart  . CERVIX LESION DESTRUCTION N/A ? years ago   with abnormal pap  . COLONOSCOPY     polyps  . TUBAL LIGATION      Social History:  reports that she has never smoked. She has never used smokeless tobacco. She reports that she drinks about 1.8 oz of alcohol per week . She reports that she does not use drugs.  Family History:  Family History  Problem Relation Age of Onset  . Colon cancer Sister 70  . Diabetes Sister   . Diabetes Father   . Diabetes Brother   . Diabetes Sister   . Stomach cancer Neg Hx      Prior to Admission medications   Medication Sig Start Date End Date Taking? Authorizing Provider  aspirin 81 MG tablet Take 81 mg by mouth every other day.    Yes Historical Provider, MD  bisoprolol-hydrochlorothiazide (ZIAC) 5-6.25 MG tablet TAKE ONE TABLET BY MOUTH ONCE  DAILY IN THE MORNING FOR BLOOD PRESSURE, FLUID AND PALPITATIONS Patient taking differently: Take 0.5 tablets by mouth every morning.  01/08/16  Yes Courtney Forcucci, PA-C  calcium carbonate (OS-CAL) 600 MG TABS tablet Take 600 mg by mouth daily with breakfast.   Yes Historical Provider, MD  Cholecalciferol (VITAMIN D PO) Take 5,000 Units by mouth every morning.    Yes Historical Provider, MD  fish oil-omega-3 fatty acids 1000 MG capsule Take 1 g by mouth 3 (three) times daily with meals.    Yes Historical Provider, MD  Flaxseed, Linseed,  (FLAXSEED OIL PO) Take 1 tablet by mouth 3 (three) times daily with meals.    Yes Historical Provider, MD  MAGNESIUM PO Take 400 mg by mouth every morning.    Yes Historical Provider, MD  Multiple Vitamin (MULTIVITAMIN) tablet Take 1 tablet by mouth every morning.    Yes Historical Provider, MD  meloxicam (MOBIC) 15 MG tablet TAKE ONE TABLET BY MOUTH ONCE DAILY AS NEEDED FOR PAIN WITH FOOD Patient not taking: Reported on 02/16/2016 11/23/15   Unk Pinto, MD    Physical Exam: Vitals:   02/15/16 2350 02/15/16 2352 02/16/16 0030 02/16/16 0100  BP: 122/66  129/67 123/59  Pulse: (!) 59  (!) 51 (!) 51  Resp: 16  12 15   Temp: 97.9 F (36.6 C)     TempSrc: Oral     SpO2: 98%  98% 96%  Weight:  64.9 kg (143 lb)    Height:  5\' 8"  (1.727 m)     General: Not in acute distress HEENT:       Eyes: PERRL, EOMI, no scleral icterus.       ENT: No discharge from the ears and nose, no pharynx injection, no tonsillar enlargement.        Neck: No JVD, no bruit, no mass felt. Heme: No neck lymph node enlargement. Cardiac: S1/S2, RRR, No murmurs, No gallops or rubs. Respiratory: No rales, wheezing, rhonchi or rubs. GI: Soft, nondistended, nontender, no rebound pain, no organomegaly, BS present. GU: No hematuria Ext: No pitting leg edema bilaterally. 2+DP/PT pulse bilaterally. Musculoskeletal: No joint deformities, No joint redness or warmth, no limitation of ROM in spin. Skin: No rashes.  Neuro: Alert, oriented X3, cranial nerves II-XII grossly intact, moves all extremities normally. Psych: Patient is not psychotic, no suicidal or hemocidal ideation.  Labs on Admission: I have personally reviewed following labs and imaging studies  CBC:  Recent Labs Lab 02/16/16 0036  WBC 5.6  NEUTROABS 3.9  HGB 10.0*  HCT 29.7*  MCV 89.5  PLT [id number]   Basic Metabolic Panel:  Recent Labs Lab 02/16/16 0036  NA 136  K 4.0  CL 104  CO2 27  GLUCOSE 117*  BUN 21*  CREATININE 0.75  CALCIUM 8.6*    GFR: Estimated Creatinine Clearance: 70.7 mL/min (by C-G formula based on SCr of 0.75 mg/dL). Liver Function Tests:  Recent Labs Lab 02/16/16 0036  AST 25  ALT 35  ALKPHOS 37*  BILITOT 0.3  PROT 6.0*  ALBUMIN 3.8   No results for input(s): LIPASE, AMYLASE in the last 168 hours. No results for input(s): AMMONIA in the last 168 hours. Coagulation Profile: No results for input(s): INR, PROTIME in the last 168 hours. Cardiac Enzymes: No results for input(s): CKTOTAL, CKMB, CKMBINDEX, TROPONINI in the last 168 hours. BNP (last 3 results) No results for input(s): PROBNP in the last 8760 hours. HbA1C: No results for input(s): HGBA1C in the last 72 hours. CBG:  No results for input(s): GLUCAP in the last 168 hours. Lipid Profile: No results for input(s): CHOL, HDL, LDLCALC, TRIG, CHOLHDL, LDLDIRECT in the last 72 hours. Thyroid Function Tests: No results for input(s): TSH, T4TOTAL, FREET4, T3FREE, THYROIDAB in the last 72 hours. Anemia Panel: No results for input(s): VITAMINB12, FOLATE, FERRITIN, TIBC, IRON, RETICCTPCT in the last 72 hours. Urine analysis:    Component Value Date/Time   COLORURINE YELLOW 10/08/2015 [place] 10/08/2015 1132   LABSPEC 1.007 10/08/2015 1132   PHURINE 6.5 10/08/2015 1132   GLUCOSEU NEGATIVE 10/08/2015 1132   HGBUR NEGATIVE 10/08/2015 1132   BILIRUBINUR NEGATIVE 10/08/2015 1132   KETONESUR NEGATIVE 10/08/2015 1132   PROTEINUR NEGATIVE 10/08/2015 1132   NITRITE NEGATIVE 10/08/2015 1132   LEUKOCYTESUR NEGATIVE 10/08/2015 1132   Sepsis Labs: @LABRCNTIP (procalcitonin:4,lacticidven:4) )No results found for this or any previous visit (from the past 240 hour(s)).   Radiological Exams on Admission: Dg Abd Acute W/chest  Result Date: 02/16/2016 CLINICAL DATA:  Abdominal bloating after colonoscopy 2 days prior. Rectal bleeding. EXAM: DG ABDOMEN ACUTE W/ 1V CHEST COMPARISON:  None. FINDINGS: The heart is normal in size. Mild aortic  tortuosity. The lungs are clear. There is no free intra-abdominal air. No dilated bowel loops to suggest obstruction. Air and small volume of stool in the colon. Three small clips overlie the colon, 2 in the region of the ascending colon in the right mid and lower abdomen, 1 in the region of the sigmoid colon in the mid pelvis. No radiopaque calculi. No acute osseous abnormalities are seen. IMPRESSION: 1. No free air.  No evidence of bowel obstruction. 2. Air and small volume of stool throughout the colon, not unexpected post colonoscopy. 3.  No acute pulmonary process. Electronically Signed   By: Jeb Levering M.D.   On: 02/16/2016 01:28     EKG: Not done in ED, will get one.   Assessment/Plan Principal Problem:   Lower GI bleeding Active Problems:   History of colonic polyps   Hyperlipidemia   Hypertension   Anemia, iron deficiency   Lower GI bleeding: it is most likely that pt had lower GI bleeding from recent procedure. Hgb dropped by 3 gram, but hemodynamically stable. No shortness of breath, dizziness or chest pain. GI, Dr. Havery Moros was consulted, recommended admitted to stepdown and do bowel preparation for repeating colonoscopy. - will admit to SDU as inpt - NPO - NS at 100 mL/hr - Start IV pantoprazole 40 mg bib - Zofran IV for nausea - Avoid NSAIDs and SQ heparin - Maintain IV access (2 large bore IVs if possible). - Monitor closely and follow q6h cbc, transfuse as necessary. - LaB: INR, PTT, and type screen -Movi-prep   HLD: Last LDL was 92 on 10/08/15.pt is not taking meds now -f/u with PCP  HTN: -will switch HCTZ-bisoprolo to Bisprolol only , 2.5 mg daily -IV hydralazine when necessary  Anemia due to acute blood loss: -Need to start iron supplement after acute issues over   DVT ppx: SCD Code Status: Full code Family Communication: Yes, patient's  Husband at bed side Disposition Plan:  Anticipate discharge back to previous home environment Consults called:   GI, Dr. Havery Moros Admission status: SDU/inpation       Date of Service 02/16/2016    Ivor Costa [facility] Pager [phone]  If 7PM-7AM, please contact night-coverage [email or web address] Password [email or web address] 02/16/2016, 2:59 AM

## 2016-02-16 NOTE — Interval H&P Note (Signed)
History and Physical Interval Note:  02/16/2016 11:20 AM  [name] [name]  has presented today for surgery, with the diagnosis of Post-polypectomy bleed  The various methods of treatment have been discussed with the patient and family. After consideration of risks, benefits and other options for treatment, the patient has consented to  Procedure(s): COLONOSCOPY (N/A) as a surgical intervention .  The patient's history has been reviewed, patient examined, no change in status, stable for surgery.  I have reviewed the patient's chart and labs.  Questions were answered to the patient's satisfaction.     Pricilla Riffle. Fuller Plan

## 2016-02-16 NOTE — Consult Note (Signed)
Consultation  Referring Provider:     Dr. Thereasa Solo Primary Care Physician:  [name], MD Primary Gastroenterologist:  Dr. Silvano Rusk       Reason for Consultation:  Hematochezia          HPI:   [name] [name] is a 66 y.o. female with a past medical history positive for hypertension, hyperlipidemia and colon polyps who presented to the ER the night of 02/15/16 with complaint of hematochezia. The patient just underwent a surveillance colonoscopy done by Dr. Carlean Purl on 02/13/16 with findings of polyps and diverticulosis, including a 12 mm polyp that was resected and retrieved from the ileocecal valve, it was noted that the polyp was semi-sessile and was removed with a hot snare with an estimated blood loss of 20 mL,  requiring treatment with placement of hemostatic clips 3+1 that did not stay on site +5 mL of epinephrine.   Today, the patient tells me that she was doing well after her colonoscopy until last night around 10:30 when she felt like she had have a bowel movement and went to the bathroom and when she wiped saw a lot of bright red blood. She then looked in the commode and saw a mixture of bright red and darker looking blood. She tells me she had one other bowel movement with "gushing of bright red blood", and they called the ambulance. She was brought to the ER.     Overnight patient has been drinking a colonoscopy prep and tells me that she finished this around 7 AM or so, but is still having production of some bright red blood which seems more diluted in nature. Her husband and daughter are by her bedside and are quite worried about her situation. Initially the patient describes some bloating and abdominal discomfort which has gotten some better after all of the bowel prep. Patient did have one episode of fainting during the early hours of the morning when returning to her bed from the toilet per nursing staff.   Patient denies fever, chills, nausea or vomiting.  ED Course:  pt was found to have positive FOBT, hemoglobin dropped from 13.0 on 10/08/15-->10.0, heart rate 51, no tachypnea, O2 sat 96% on room air, electrolytes renal function okay. X-ray of acute abdomen/chest was negative. Patient was admitted to stepdown as inpatient as recommended by GI, Dr. Enis Gash.  Past Medical History:  Diagnosis Date  . Heart murmur   . Hyperlipidemia   . Hypertension   . Prediabetes   . Seasonal allergies   . Vitamin D deficiency     Past Surgical History:  Procedure Laterality Date  . CARDIAC SURGERY  1958   at age 18 for hole in heart  . CERVIX LESION DESTRUCTION N/A ? years ago   with abnormal pap  . COLONOSCOPY     polyps  . TUBAL LIGATION      Family History  Problem Relation Age of Onset  . Colon cancer Sister 79  . Diabetes Sister   . Diabetes Father   . Diabetes Brother   . Diabetes Sister   . Stomach cancer Neg Hx     Social History  Substance Use Topics  . Smoking status: Never Smoker  . Smokeless tobacco: Never Used  . Alcohol use 1.8 oz/week    3 Standard drinks or equivalent per week     Comment: occasional wine    Prior to Admission medications   Medication Sig Start Date End Date Taking? Authorizing Provider  aspirin 81 MG tablet Take 81 mg by mouth every other day.    Yes Historical Provider, MD  bisoprolol-hydrochlorothiazide (ZIAC) 5-6.25 MG tablet TAKE ONE TABLET BY MOUTH ONCE DAILY IN THE MORNING FOR BLOOD PRESSURE, FLUID AND PALPITATIONS Patient taking differently: Take 0.5 tablets by mouth every morning.  01/08/16  Yes Courtney Forcucci, PA-C  calcium carbonate (OS-CAL) 600 MG TABS tablet Take 600 mg by mouth daily with breakfast.   Yes Historical Provider, MD  Cholecalciferol (VITAMIN D PO) Take 5,000 Units by mouth every morning.    Yes Historical Provider, MD  fish oil-omega-3 fatty acids 1000 MG capsule Take 1 g by mouth 3 (three) times daily with meals.    Yes Historical Provider, MD  Flaxseed, Linseed, (FLAXSEED OIL PO)  Take 1 tablet by mouth 3 (three) times daily with meals.    Yes Historical Provider, MD  MAGNESIUM PO Take 400 mg by mouth every morning.    Yes Historical Provider, MD  Multiple Vitamin (MULTIVITAMIN) tablet Take 1 tablet by mouth every morning.    Yes Historical Provider, MD    Current Facility-Administered Medications  Medication Dose Route Frequency Provider Last Rate Last Dose  . 0.9 %  sodium chloride infusion   Intravenous Continuous Cherene Altes, MD 125 mL/hr at 02/16/16 [phone]    . acetaminophen (TYLENOL) tablet 650 mg  650 mg Oral Q6H PRN Ivor Costa, MD       Or  . acetaminophen (TYLENOL) suppository 650 mg  650 mg Rectal Q6H PRN Ivor Costa, MD      . bisoprolol (ZEBETA) tablet 2.5 mg  2.5 mg Oral Daily Ivor Costa, MD      . hydrALAZINE (APRESOLINE) injection 5 mg  5 mg Intravenous Q2H PRN Ivor Costa, MD      . ondansetron [facility]) tablet 4 mg  4 mg Oral Q6H PRN Ivor Costa, MD       Or  . ondansetron [facility]) injection 4 mg  4 mg Intravenous Q6H PRN Ivor Costa, MD      . sodium chloride flush (NS) 0.9 % injection 3 mL  3 mL Intravenous Q12H Ivor Costa, MD      . zolpidem (AMBIEN) tablet 5 mg  5 mg Oral QHS PRN Ivor Costa, MD        Allergies as of 02/15/2016 - Review Complete 02/15/2016  Allergen Reaction Noted  . Amoxil [amoxicillin] Rash 01/02/2013  . Ampicillin Rash 03/09/2011     Review of Systems:    Constitutional: Positive for fatigue No weight loss, fever or chills Skin: No rash  Cardiovascular: No chest pain Respiratory: No SOB  Gastrointestinal: See HPI and otherwise negative Genitourinary: No dysuria or change in urinary frequency Neurological: Positive for dizziness and syncope Musculoskeletal: No new muscle or joint pain Hematologic: No bruising Psychiatric: No history of depression or anxiety   Physical Exam:  Vital signs in last 24 hours: Temp:  [96.3 F (35.7 C)-98.2 F (36.8 C)] 96.3 F (35.7 C) (01/29 0800) Pulse Rate:  [51-73] 73 (01/29  0525) Resp:  [12-19] 19 (01/29 0600) BP: (116-130)/(53-67) 116/53 (01/29 0525) SpO2:  [96 %-99 %] 99 % (01/29 0600) Weight:  [143 lb (64.9 kg)-143 lb 1.3 oz (64.9 kg)] 143 lb 1.3 oz (64.9 kg) (01/29 0330) Last BM Date: 02/16/16 General:   Pleasant Caucasian female appears to be in NAD, Well developed, Well nourished, alert and cooperative Head:  Normocephalic and atraumatic. Eyes:   PEERL, EOMI. No icterus. Conjunctiva pink. Ears:  Normal  auditory acuity. Neck:  Supple Throat: Oral cavity and pharynx without inflammation, swelling or lesion. Teeth in good condition. Lungs: Respirations even and unlabored. Lungs clear to auscultation bilaterally.   No wheezes, crackles, or rhonchi.  Heart: Normal S1, S2. No MRG. Regular rate and rhythm. No peripheral edema, cyanosis or pallor.  Abdomen:  Soft, nondistended, nontender. No rebound or guarding. Normal bowel sounds. No appreciable masses or hepatomegaly. Rectal:  Not performed. (visualization of bed sheet reveal diluted brb)  Msk:  Symmetrical without gross deformities. Peripheral pulses intact.  Extremities:  Without edema, no deformity or joint abnormality.  Neurologic:  Alert and  oriented x4;  grossly normal neurologically  Skin:   Dry and intact without significant lesions or rashes. Psychiatric: Demonstrates good judgement and reason without abnormal affect or behaviors.   LAB RESULTS:  Recent Labs  02/16/16 0036 02/16/16 0332  WBC 5.6 6.9  HGB 10.0* 10.0*  HCT 29.7* 29.3*  PLT 182 185   BMET  Recent Labs  02/16/16 0036 02/16/16 0332  NA 136 140  K 4.0 4.1  CL 104 105  CO2 27 26  GLUCOSE 117* 120*  BUN 21* 20  CREATININE 0.75 0.65  CALCIUM 8.6* 8.8*   LFT  Recent Labs  02/16/16 0036  PROT 6.0*  ALBUMIN 3.8  AST 25  ALT 35  ALKPHOS 37*  BILITOT 0.3   PT/INR  Recent Labs  02/16/16 0332  LABPROT 15.0  INR 1.17    STUDIES: Dg Abd Acute W/chest  Result Date: 02/16/2016 CLINICAL DATA:  Abdominal  bloating after colonoscopy 2 days prior. Rectal bleeding. EXAM: DG ABDOMEN ACUTE W/ 1V CHEST COMPARISON:  None. FINDINGS: The heart is normal in size. Mild aortic tortuosity. The lungs are clear. There is no free intra-abdominal air. No dilated bowel loops to suggest obstruction. Air and small volume of stool in the colon. Three small clips overlie the colon, 2 in the region of the ascending colon in the right mid and lower abdomen, 1 in the region of the sigmoid colon in the mid pelvis. No radiopaque calculi. No acute osseous abnormalities are seen. IMPRESSION: 1. No free air.  No evidence of bowel obstruction. 2. Air and small volume of stool throughout the colon, not unexpected post colonoscopy. 3.  No acute pulmonary process. Electronically Signed   By: Jeb Levering M.D.   On: 02/16/2016 01:28     PREVIOUS ENDOSCOPIES:            See HPI-recent colonoscopy   Impression / Plan:   Impression: 1. Hematochezia: Patient started with bright red blood per rectum yesterday around 10:30 PM, has continued with prep overnight, patient is status post colonoscopy with removal of 12 mm polyp from the ileocecum on Friday with Dr. Carlean Purl, likely this represents a post-polypectomy bleed 2. Acute blood loss anemia: Hemoglobin typically around 13, dropped to 10, awaiting next blood draw  Plan: 1. Patient remained nothing by mouth overnight and underwent bowel prep and is scheduled for a colonoscopy this morning around 11 AM with Dr. Fuller Plan. We did discuss risks, benefits, limitations alternatives the patient agrees to proceed. 2. Patient to remain nothing by mouth until after time of procedure 3. Did discuss the nature of post polypectomy bleeds with the family and the patient, tried to reassure them and answer questions 4. Continue other supportive measures including monitoring of hemoglobin with transfusion less than 7 5. Case was discussed with Dr. Fuller Plan, please await any further recommendations after time  of procedure  this morning  Thank you for your kind consultation, we will continue to follow.  Lavone Nian Lemmon  02/16/2016, 9:18 AM Pager #: [phone]     Attending physician's note   I have taken a history, examined the patient and reviewed the chart. I agree with the Advanced Practitioner's note, impression and recommendations.  Hematochezia starting late last night is very likely due to a post polypectomy bleed from the IC valve polyp site. She has continued to bleed with bowel prep over night. Hb has dropped from 13 to 10. This site bleed immediately after polypectomy and was treated with epi injection and 2 clips with hemostasis. Urgent colonoscopy today.   Lucio Edward, MD [facility] [phone] Mon-Fri 8a-5p [phone] after 5p, weekends, holidays

## 2016-02-16 NOTE — Transfer of Care (Signed)
Immediate Anesthesia Transfer of Care Note  Patient: [name] [name]  Procedure(s) Performed: Procedure(s): COLONOSCOPY (N/A)  Patient Location: PACU  Anesthesia Type:MAC  Level of Consciousness:  sedated, patient cooperative and responds to stimulation  Airway & Oxygen Therapy:Patient Spontanous Breathing and Patient connected to face mask oxgen  Post-op Assessment:  Report given to PACU RN and Post -op Vital signs reviewed and stable  Post vital signs:  Reviewed and stable  Last Vitals:  Vitals:   02/16/16 1014 02/16/16 1015  BP:  (!) 108/41  Pulse:    Resp: 16   Temp: 35.5 C     Complications: No apparent anesthesia complications

## 2016-02-16 NOTE — Op Note (Addendum)
[facility] Patient Name: [name] [name] Procedure Date: 02/16/2016 MRN: [id number] Attending MD: Ladene Artist , MD Date of Birth: 08-Dec-1950 CSN: [id number] Age: 66 Admit Type: Inpatient Procedure:                Colonoscopy Indications:              Hematochezia, Treatment of bleeding from                            polypectomy site Providers:                Pricilla Riffle. Fuller Plan, MD, Hilma Favors, RN, Elspeth Cho Tech., Technician, Christell Faith, CRNA Referring MD:              Medicines:                Monitored Anesthesia Care Complications:            No immediate complications. Estimated blood loss:                            None. Estimated Blood Loss:     Estimated blood loss: none. Procedure:                Pre-Anesthesia Assessment:                           - Prior to the procedure, a History and Physical                            was performed, and patient medications and                            allergies were reviewed. The patient's tolerance of                            previous anesthesia was also reviewed. The risks                            and benefits of the procedure and the sedation                            options and risks were discussed with the patient.                            All questions were answered, and informed consent                            was obtained. Prior Anticoagulants: The patient has                            taken no previous anticoagulant or antiplatelet                            agents. ASA Grade Assessment:  II - A patient with                            mild systemic disease. After reviewing the risks                            and benefits, the patient was deemed in                            satisfactory condition to undergo the procedure.                           After obtaining informed consent, the colonoscope                            was passed under direct vision.  Throughout the                            procedure, the patient's blood pressure, pulse, and                            oxygen saturations were monitored continuously. The                            [id number] scope was introduced through                            the anus and advanced to the the cecum, identified                            by appendiceal orifice and ileocecal valve. The                            ileocecal valve, appendiceal orifice, and rectum                            were photographed. The quality of the bowel                            preparation was unsatisfactory due to blood clots                            throughout the colon. The patient tolerated the                            procedure well. The colonoscopy was somewhat                            difficult due to excessive bleeding. Successful                            completion of the procedure was aided by  controlling the bleeding. Scope In: 11:42:14 AM Scope Out: 12:09:13 PM Scope Withdrawal Time: 0 hours 20 minutes 29 seconds  Total Procedure Duration: 0 hours 26 minutes 59 seconds  Findings:      The perianal and digital rectal examinations were normal.      A few large-mouthed diverticula were found in the sigmoid colon.      Active bleeding and a large clot was seen at the ileocecal valve,       secondary to previous polypectomy procedure. Previously placed clips       were not seen. Area was injected with 6 mL of a 1:10,000 solution of       epinephrine for hemostasis with slowed but persistent bleeding. For       hemostasis, three hemostatic clips were successfully placed. The clots       were removed from the cecum and IC vavle. There was no bleeding at the       end of the procedure.      The exam was otherwise without abnormality on direct and retroflexion       views however multiple areas obscured by clots and a detailed exam was       not performed as  the procedure was focused on control of bleeding. Impression:               - Preparation of the colon was unsatisfactory.                           - Diverticulosis in the sigmoid colon.                           - Bleeding at the ileocecal valve secondary to                            previous polypectomy. Epi injection and 3 clips                            were placed with hemostasis acheived.                           - The examination was otherwise normal on direct                            and retroflexion views.                           - No specimens collected. Moderate Sedation:      N/A- Per Anesthesia Care Recommendation:           - Repeat colonoscopy date to be determined after                            pending pathology results are reviewed for                            surveillance per Dr. Carlean Purl.                           - Patient has a contact number available for  emergencies. The signs and symptoms of potential                            delayed complications were discussed with the                            patient.                           - Return to hospital ward for ongoing care                           - Clear liquid diet.                           - Consider IR embolization for rebleed                           - Continue present medications.                           - No aspirin, ibuprofen, naproxen, or other                            non-steroidal anti-inflammatory drugs for 3 weeks                            after polyp removal. No vigorous activity for 2                            weeks. Procedure Code(s):        --- Professional ---                           [id number], Colonoscopy, flexible; with control of                            bleeding, any method Diagnosis Code(s):        --- Professional ---                           K91.840, Postprocedural hemorrhage of a digestive                            system organ or  structure following a digestive                            system procedure                           K92.1, Melena (includes Hematochezia)                           K57.30, Diverticulosis of large intestine without                            perforation or abscess without bleeding CPT  copyright 2016 American Medical Association. All rights reserved. The codes documented in this report are preliminary and upon coder review may  be revised to meet current compliance requirements. Ladene Artist, MD 02/16/2016 12:23:21 PM This report has been signed electronically. Number of Addenda: 0

## 2016-02-16 NOTE — ED Notes (Signed)
Report called to charge. Will bring pt to [id number] in 20 minutes per charge nurse.

## 2016-02-17 ENCOUNTER — Encounter (HOSPITAL_COMMUNITY): Payer: Self-pay | Admitting: Gastroenterology

## 2016-02-17 ENCOUNTER — Telehealth: Payer: Self-pay

## 2016-02-17 DIAGNOSIS — D62 Acute posthemorrhagic anemia: Secondary | ICD-10-CM

## 2016-02-17 LAB — PREPARE RBC (CROSSMATCH)

## 2016-02-17 LAB — COMPREHENSIVE METABOLIC PANEL
ALT: 23 U/L (ref 14–54)
AST: 16 U/L (ref 15–41)
Albumin: 3 g/dL — ABNORMAL LOW (ref 3.5–5.0)
Alkaline Phosphatase: 28 U/L — ABNORMAL LOW (ref 38–126)
Anion gap: 4 — ABNORMAL LOW (ref 5–15)
BUN: 7 mg/dL (ref 6–20)
CO2: 25 mmol/L (ref 22–32)
Calcium: 8 mg/dL — ABNORMAL LOW (ref 8.9–10.3)
Chloride: 113 mmol/L — ABNORMAL HIGH (ref 101–111)
Creatinine, Ser: 0.59 mg/dL (ref 0.44–1.00)
GFR calc Af Amer: >60 mL/min (ref >60)
GFR calc non Af Amer: >60 mL/min (ref >60)
Glucose, Bld: 98 mg/dL (ref 65–99)
Potassium: 3.6 mmol/L (ref 3.5–5.1)
Sodium: 142 mmol/L (ref 135–145)
Total Bilirubin: 0.7 mg/dL (ref 0.3–1.2)
Total Protein: 4.8 g/dL — ABNORMAL LOW (ref 6.5–8.1)

## 2016-02-17 LAB — CBC
HCT: 20.1 % — ABNORMAL LOW (ref 36.0–46.0)
HCT: 31.6 % — ABNORMAL LOW (ref 36.0–46.0)
Hemoglobin: 6.8 g/dL — CL (ref 12.0–15.0)
Hemoglobin: 10.5 g/dL — ABNORMAL LOW (ref 12.0–15.0)
MCH: 30.5 pg (ref 26.0–34.0)
MCH: 29.3 pg (ref 26.0–34.0)
MCHC: 33.8 g/dL (ref 30.0–36.0)
MCHC: 33.2 g/dL (ref 30.0–36.0)
MCV: 90.1 fL (ref 78.0–100.0)
MCV: 88.3 fL (ref 78.0–100.0)
Platelets: 154 10*3/uL (ref 150–400)
Platelets: 170 10*3/uL (ref 150–400)
RBC: 2.23 MIL/uL — ABNORMAL LOW (ref 3.87–5.11)
RBC: 3.58 MIL/uL — ABNORMAL LOW (ref 3.87–5.11)
RDW: 12.5 % (ref 11.5–15.5)
RDW: 13.6 % (ref 11.5–15.5)
WBC: 4.5 10*3/uL (ref 4.0–10.5)
WBC: 5.3 10*3/uL (ref 4.0–10.5)

## 2016-02-17 MED ORDER — SODIUM CHLORIDE 0.9 % IV SOLN: Freq: Once | INTRAVENOUS | Status: DC

## 2016-02-17 MED ORDER — SODIUM CHLORIDE 0.9 % IV SOLN
Freq: Once | INTRAVENOUS | Status: AC
  Administered 2016-02-17: 10:00:00 via INTRAVENOUS

## 2016-02-17 NOTE — Progress Notes (Signed)
Inflammatory polyp Repeat colon 5 years hx polyps and FHx CRCA I will call patient

## 2016-02-17 NOTE — Telephone Encounter (Signed)
  Follow up Call-  Call back number 02/13/2016  Post procedure Call Back phone  # [phone]  Permission to leave phone message Yes  Some recent data might be hidden    Patient was called for follow up after her procedure on 02/13/2016. No answer at the number given for follow up phone call. I was not able to leave a message, as voice mail was not available.

## 2016-02-17 NOTE — Progress Notes (Signed)
[name] [name]  [id number] DOB: 1950-12-06 DOA: 02/15/2016 PCP: [name], MD    Brief Narrative:  66 y.o. female with history of hypertension, hyperlipidemia, and colon polyps who presented with rectal bleeding.  She underwent a surveillance colonoscopy by Dr. Carlean Purl on 02/13/16, was found to have a polyp and diverticulosis, and the polyp was removed.  There was some bleeding at that time and the area was injectied w/ epi and 'clipped'. She had been doing well until she had a bowel movement with frank rectal bleeding with a blood clot, followed by another episode of rectal bleeding.  Pt stopped taking ASA and mobic two weeks prior. In the ED hemoglobin was noted to have dropped from 13.0 on 10/08/15 >10.0.  Patient was admitted to stepdown and discussed w/ GI, Dr. Enis Gash.  Subjective: She report last bloody BM was last night. She denies abdominal pain.  Hb this morning at 6.8.  On Clear diet.   Assessment & Plan:  Lower GI bleeding post polypectomy -GI consulted. Patient underwent colonoscopy 1-29 by Dr Forrestine Him, S/P epi injection and clips at prior polypectomy site.  -Hb this morning drop to 6.8 from 7. Will proceed with total 2 units of PRBC.  -monitor for recurrent GI bleed. Consider IR evaluation if re-bleed per GI recommendation.   Anemia due to acute blood loss Secondary to GI bleed from recent polypectomy.  Will proceed with 2 units PRBC transfusion.    Recent Labs Lab 02/16/16 0332 02/16/16 0918 02/16/16 1543 02/16/16 2235 02/17/16 0359  HGB 10.0* 9.0* 7.7* 7.4* 6.8*    Syncope ;  Had syncope 1-29 in the hospital.  Related to hypotension, anemia.  Blood transfusion.   HLD Last LDL was 92 on 10/08/15 - not taking meds now - f/u with PCP  HTN BP stable - follow    DVT prophylaxis: SCDs Code Status: FULL CODE Family Communication: \Daughter at bedside.  Disposition Plan: remain in step down drooping in hb, and monitor for GI bleed.    Consultants:  [facility]   Procedures: Colonoscopy -  Antimicrobials:  none  Objective: Blood pressure (!) 112/46, pulse 61, temperature 98.2 F (36.8 C), temperature source Oral, resp. rate 13, height 5\' 8"  (1.727 m), weight 64.9 kg (143 lb 1.3 oz), last menstrual period 11/17/2002, SpO2 98 %.  Intake/Output Summary (Last 24 hours) at 02/17/16 0736 Last data filed at 02/17/16 0600  Gross per 24 hour  Intake          3513.67 ml  Output             2175 ml  Net          1338.67 ml   Filed Weights   02/15/16 2352 02/16/16 0330  Weight: 64.9 kg (143 lb) 64.9 kg (143 lb 1.3 oz)    Examination: Pt was seen for a f/u visit.   General; alert in no acute distress.  Respiratory; no accessory muscle use.  Abdomen: Soft, NT, NG.  Extremity; no edema.   CBC:  Recent Labs Lab 02/16/16 0036 02/16/16 0332 02/16/16 0918 02/16/16 1543 02/16/16 2235 02/17/16 0359  WBC 5.6 6.9 6.7 6.6  --  4.5  NEUTROABS 3.9  --   --   --   --   --   HGB 10.0* 10.0* 9.0* 7.7* 7.4* 6.8*  HCT 29.7* 29.3* 26.1* 22.5* 21.9* 20.1*  MCV 89.5 90.7 91.3 89.3  --  90.1  PLT 182 185 175 153  --  154   Basic  Metabolic Panel:  Recent Labs Lab 02/16/16 0036 02/16/16 0332 02/17/16 0359  NA 136 140 142  K 4.0 4.1 3.6  CL 104 105 113*  CO2 27 26 25   GLUCOSE 117* 120* 98  BUN 21* 20 7  CREATININE 0.75 0.65 0.59  CALCIUM 8.6* 8.8* 8.0*   GFR: Estimated Creatinine Clearance: 70.7 mL/min (by C-G formula based on SCr of 0.59 mg/dL).  Liver Function Tests:  Recent Labs Lab 02/16/16 0036 02/17/16 0359  AST 25 16  ALT 35 23  ALKPHOS 37* 28*  BILITOT 0.3 0.7  PROT 6.0* 4.8*  ALBUMIN 3.8 3.0*   Coagulation Profile:  Recent Labs Lab 02/16/16 0332  INR 1.17    HbA1C: Hgb A1c MFr Bld  Date/Time Value Ref Range Status  10/08/2015 11:32 AM 5.6 <5.7 % Final    Comment:      For the purpose of screening for the presence of diabetes:   <5.7%       Consistent with the absence of  diabetes 5.7-6.4 %   Consistent with increased risk for diabetes (prediabetes) >=6.5 %     Consistent with diabetes   This assay result is consistent with a decreased risk of diabetes.   Currently, no consensus exists regarding use of hemoglobin A1c for diagnosis of diabetes in children.   According to American Diabetes Association (ADA) guidelines, hemoglobin A1c <7.0% represents optimal control in non-pregnant diabetic patients. Different metrics may apply to specific patient populations. Standards of Medical Care in Diabetes (ADA).     07/26/2014 11:38 AM 5.9 (H) <5.7 % Final    Comment:                                                                           According to the ADA Clinical Practice Recommendations for 2011, when HbA1c is used as a screening test:     >=6.5%   Diagnostic of Diabetes Mellitus            (if abnormal result is confirmed)   5.7-6.4%   Increased risk of developing Diabetes Mellitus   References:Diagnosis and Classification of Diabetes Mellitus,Diabetes [id number] 1):S62-S69 and Standards of Medical Care in         Diabetes - 2011,Diabetes [id number] (Suppl 1):S11-S61.        Recent Results (from the past 240 hour(s))  MRSA PCR Screening     Status: None   Collection Time: 02/16/16  5:13 AM  Result Value Ref Range Status   MRSA by PCR NEGATIVE NEGATIVE Final    Comment:        The GeneXpert MRSA Assay (FDA approved for NASAL specimens only), is one component of a comprehensive MRSA colonization surveillance program. It is not intended to diagnose MRSA infection nor to guide or monitor treatment for MRSA infections.      Scheduled Meds: . sodium chloride   Intravenous Once  . sodium chloride   Intravenous Once  . bisoprolol  2.5 mg Oral Daily  . sodium chloride flush  3 mL Intravenous Q12H   Continuous Infusions: . sodium chloride 125 mL/hr at 02/16/16 2354     LOS: 1 day   Time spent: No Charge  Niel Hummer, MD [phone] [facility]   If 7PM-7AM, please contact night-coverage [email or web address] Password [facility] 02/17/2016, 7:36 AM

## 2016-02-17 NOTE — Progress Notes (Signed)
CRITICAL VALUE ALERT  Critical value received:  hgb 6.8  Date of notification:  02/17/2016  Time of notification:  0421  Critical value read back:Yes.    Nurse who received alert:  mk  MD notified (1st page):  Baltazar Najjar, NP  Time of first page:  [phone]  MD notified (2nd page):  Time of second page:  Responding MD:  Baltazar Najjar  Time MD responded:  [phone]

## 2016-02-17 NOTE — Progress Notes (Signed)
Progress Note   Subjective  Chief Complaint: Post-polypectomy bleed  Pt s/p colonoscopy 02/16/16 with placement of 3 endoclips and injection of epinephrine at area of previous polypectomy at ileocecal valve.  Patient is found laying in bed comfortably this morning, she tells me she has had no further rectal bleeding. She did have 1 episode of hematuria, but this cleared. She has had no further syncope or abdominal pain/bloating. She did eat breakfast this morning. She received PRBCs with one further ordered for later today.     Objective   Vital signs in last 24 hours: Temp:  [97.8 F (36.6 C)-98.6 F (37 C)] 98.6 F (37 C) (01/30 0825) Pulse Rate:  [57-76] 66 (01/30 0825) Resp:  [13-22] 22 (01/30 0825) BP: (96-135)/(38-65) 119/49 (01/30 0825) SpO2:  [95 %-100 %] 97 % (01/30 0825) Last BM Date: 02/16/16 General: Caucasian female in NAD Heart:  Regular rate and rhythm; no murmurs Lungs: Respirations even and unlabored, lungs CTA bilaterally Abdomen:  Soft, nontender and nondistended. Normal bowel sounds. Extremities:  Without edema. Neurologic:  Alert and oriented,  grossly normal neurologically. Psych:  Cooperative. Normal mood and affect.  Intake/Output from previous day: 01/29 0701 - 01/30 0700 In: 3513.7 [P.O.:120; I.V.:3393.7] Out: 2175 [Urine:2175] Intake/Output this shift: Total I/O In: 338.3 [Blood:338.3] Out: -   Lab Results:  Recent Labs  02/16/16 0918 02/16/16 1543 02/16/16 2235 02/17/16 0359  WBC 6.7 6.6  --  4.5  HGB 9.0* 7.7* 7.4* 6.8*  HCT 26.1* 22.5* 21.9* 20.1*  PLT 175 153  --  154   BMET  Recent Labs  02/16/16 0036 02/16/16 0332 02/17/16 0359  NA 136 140 142  K 4.0 4.1 3.6  CL 104 105 113*  CO2 27 26 25   GLUCOSE 117* 120* 98  BUN 21* 20 7  CREATININE 0.75 0.65 0.59  CALCIUM 8.6* 8.8* 8.0*   LFT  Recent Labs  02/17/16 0359  PROT 4.8*  ALBUMIN 3.0*  AST 16  ALT 23  ALKPHOS 28*  BILITOT 0.7   PT/INR  Recent Labs  02/16/16 0332  LABPROT 15.0  INR 1.17    Studies/Results: Dg Abd Acute W/chest  Result Date: 02/16/2016 CLINICAL DATA:  Abdominal bloating after colonoscopy 2 days prior. Rectal bleeding. EXAM: DG ABDOMEN ACUTE W/ 1V CHEST COMPARISON:  None. FINDINGS: The heart is normal in size. Mild aortic tortuosity. The lungs are clear. There is no free intra-abdominal air. No dilated bowel loops to suggest obstruction. Air and small volume of stool in the colon. Three small clips overlie the colon, 2 in the region of the ascending colon in the right mid and lower abdomen, 1 in the region of the sigmoid colon in the mid pelvis. No radiopaque calculi. No acute osseous abnormalities are seen. IMPRESSION: 1. No free air.  No evidence of bowel obstruction. 2. Air and small volume of stool throughout the colon, not unexpected post colonoscopy. 3.  No acute pulmonary process. Electronically Signed   By: Jeb Levering M.D.   On: 02/16/2016 01:28   Colonoscopy 02/16/16-Dr. Fuller Plan Findings:      The perianal and digital rectal examinations were normal.      A few large-mouthed diverticula were found in the sigmoid colon.      Active bleeding and a large clot was seen at the ileocecal valve,       secondary to previous polypectomy procedure. Previously placed clips       were not seen. Area was injected with 6  mL of a 1:10,000 solution of       epinephrine for hemostasis with slowed but persistent bleeding. For       hemostasis, three hemostatic clips were successfully placed. The clots       were removed from the cecum and IC vavle. There was no bleeding at the       end of the procedure.      The exam was otherwise without abnormality on direct and retroflexion       views however multiple areas obscured by clots and a detailed exam was       not performed as the procedure was focused on control of bleeding. Impression:               - Preparation of the colon was unsatisfactory.                           -  Diverticulosis in the sigmoid colon.                           - Bleeding at the ileocecal valve secondary to                            previous polypectomy. Epi injection and 3 clips                            were placed with hemostasis acheived.                           - The examination was otherwise normal on direct                            and retroflexion views.                           - No specimens collected. Moderate Sedation:      N/A- Per Anesthesia Care Recommendation:           - Repeat colonoscopy date to be determined after                            pending pathology results are reviewed for                            surveillance per Dr. Carlean Purl.                           - Patient has a contact number available for                            emergencies. The signs and symptoms of potential                            delayed complications were discussed with the                            patient.                           -  Return to hospital ward for ongoing care                           - Clear liquid diet.                           - Consider IR embolization for rebleed                           - Continue present medications.                           - No aspirin, ibuprofen, naproxen, or other                            non-steroidal anti-inflammatory drugs for 3 weeks                            after polyp removal. No vigorous activity for 2                            weeks.    Assessment / Plan:    Assessment: 1. Post polypectomy bleed: Status post repeat colonoscopy 02/16/16 with placement of 3 endoclips and injection of epinephrine into area of previous polypectomy, patient's hemoglobin did drop to below 7 this morning, she has received 1 unit of PRBCs with another ordered, otherwise doing well no further episodes of rectal bleeding since before time of procedure 2. Acute blood loss anemia: See above  Plan: 1. Agree with transfusion of 2 units PRBCs  total continue to monitor hemoglobin 2. Continue regular diet 3. Would recommend that at time of discharge patient rest at home for 1 week, avoid vigorous activity for 2 weeks and avoids all NSAIDs and aspirin for 3 weeks, did discuss this with the patient today. 4. Continue other supportive measures 5. Please await any further recommendations from Dr. Fuller Plan later today  Thank you for your kind consultation, we will continue to follow   LOS: 1 day   Levin Erp  02/17/2016, 9:19 AM  Pager # [phone]     Attending physician's note   I have taken an interval history, reviewed the chart and examined the patient. I agree with the Advanced Practitioner's note, impression and recommendations. No recurrent bleeding. Agree with transfusion to keep Hb > 8. If no recurrent bleeding and Hb is stable tomorrow she should be ok for discharge. Rest at home with minimal activity for 1 week. No vigorous activity for 2 weeks. No ASA/NSAIDs or fish oil for 3 weeks.   Lucio Edward, MD [facility] [phone] Mon-Fri 8a-5p [phone] after 5p, weekends, holidays

## 2016-02-18 ENCOUNTER — Telehealth: Payer: Self-pay

## 2016-02-18 DIAGNOSIS — K922 Gastrointestinal hemorrhage, unspecified: Secondary | ICD-10-CM

## 2016-02-18 LAB — CBC
HCT: 29.2 % — ABNORMAL LOW (ref 36.0–46.0)
Hemoglobin: 10.1 g/dL — ABNORMAL LOW (ref 12.0–15.0)
MCH: 30.9 pg (ref 26.0–34.0)
MCHC: 34.6 g/dL (ref 30.0–36.0)
MCV: 89.3 fL (ref 78.0–100.0)
Platelets: 156 10*3/uL (ref 150–400)
RBC: 3.27 MIL/uL — ABNORMAL LOW (ref 3.87–5.11)
RDW: 14 % (ref 11.5–15.5)
WBC: 5.2 10*3/uL (ref 4.0–10.5)

## 2016-02-18 LAB — HEMOGLOBIN: Hemoglobin: 10.4 g/dL — ABNORMAL LOW (ref 12.0–15.0)

## 2016-02-18 MED ORDER — ACETAMINOPHEN 325 MG PO TABS: 650.0000 mg | ORAL_TABLET | Freq: Four times a day (QID) | ORAL | 0 refills | Status: AC | PRN

## 2016-02-18 NOTE — Progress Notes (Signed)
Progress Note   Subjective  Chief Complaint: Post-polypectomy bleed  This morning, pt is found sitting up in her bedside chair with her daughter by her bedside. She had a formed BM for the first time this morning in the toilet with what looked like some "brb" coming off of it per nursing staff, she then passed a clot about 30 min ago in the bedside commode (which I am able to see). She continues to deny abdominal pain or other symptoms. This is the first stool that she has passed since time of [place] on 11/15/16. She had liquids yesterday and had grits for breakfast this morning.     Objective   Vital signs in last 24 hours: Temp:  [97.7 F (36.5 C)-98.5 F (36.9 C)] 98.3 F (36.8 C) (01/31 0800) Pulse Rate:  [54-71] 64 (01/31 0844) Resp:  [15-32] 27 (01/31 0844) BP: (93-131)/(40-71) 117/43 (01/31 0844) SpO2:  [92 %-100 %] 97 % (01/31 0844) Last BM Date: 02/16/16 General:  Caucasian female in NAD Heart:  Regular rate and rhythm; no murmurs Lungs: Respirations even and unlabored, lungs CTA bilaterally Abdomen:  Soft, nontender and nondistended. Normal bowel sounds. Extremities:  Without edema. Neurologic:  Alert and oriented,  grossly normal neurologically. Psych:  Cooperative. Normal mood and affect.  Intake/Output from previous day: 01/30 0701 - 01/31 0700 In: 2832.5 [I.V.:2141.7; Blood:690.8] Out: 700 [Urine:700] Intake/Output this shift: Total I/O In: 340 [P.O.:240; I.V.:100] Out: -   Lab Results:  Recent Labs  02/17/16 0359 02/17/16 1351 02/18/16 0352  WBC 4.5 5.3 5.2  HGB 6.8* 10.5* 10.1*  HCT 20.1* 31.6* 29.2*  PLT 154 170 156   BMET  Recent Labs  02/16/16 0036 02/16/16 0332 02/17/16 0359  NA 136 140 142  K 4.0 4.1 3.6  CL 104 105 113*  CO2 27 26 25   GLUCOSE 117* 120* 98  BUN 21* 20 7  CREATININE 0.75 0.65 0.59  CALCIUM 8.6* 8.8* 8.0*   LFT  Recent Labs  02/17/16 0359  PROT 4.8*  ALBUMIN 3.0*  AST 16  ALT 23  ALKPHOS 28*  BILITOT 0.7    PT/INR  Recent Labs  02/16/16 0332  LABPROT 15.0  INR 1.17      Assessment / Plan:   Assessment: 1. Post-polypectomy bleed: s/p colonoscopy 1/29 with placement of 3 endoclips and epinephrine, pt received 2 uprbcs yesterday and hgb has remained stable around 10 since that time, pt did have her first formed bm this morning, with some "blood coming off of it", per nursing staff, and then passed a clot about 30 min ago, denies abdominal pain or other symptoms 2. Acute blood loss anemia  Plan: 1. Will recheck hgb at 1030-if remaining stable, continue to believe pt can go home today, we discussed that likely she is just passing clots from old bleed. She should alert nursing staff is she passes any more blood 2. Continue other supportive measures 3. Will discuss above with Dr. Fuller Plan, please await any further recs  Thank you for your kind consultation, we will continue to follow.   LOS: 2 days   Levin Erp  02/18/2016, 9:36 AM  Pager # [phone]    Attending physician's note   I have taken an interval history, reviewed the chart and examined the patient. I agree with the Advanced Practitioner's note, impression and recommendations. Passed a small dark clot this morning, nothing since. Hb at 1049 was 10.4. Appears to be passing old blood which may continue for  a few days. See yesterday's note for additional recommendations. OK for discharge today. CBC in 1 week. GI follow up in 2 weeks. GI signing off.   Lucio Edward, MD [facility] [phone] Mon-Fri 8a-5p [phone] after 5p, weekends, holidays

## 2016-02-18 NOTE — Care Management Note (Signed)
Case Management Note  Patient Details  Name: [name] [name] MRN: [id number] Date of Birth: [birth year]  Subjective/Objective: 66 y/o f admitted w/GIB. From home. No CM needs or orders.                   Action/Plan:d/c home.   Expected Discharge Date:  02/18/16               Expected Discharge Plan:  Home/Self Care  In-House Referral:     Discharge planning Services  CM Consult  Post Acute Care Choice:    Choice offered to:     DME Arranged:    DME Agency:     HH Arranged:    HH Agency:     Status of Service:  Completed, signed off  If discussed at [employer] of Stay Meetings, dates discussed:    Additional Comments:  Dessa Phi, RN 02/18/2016, 1:25 PM

## 2016-02-18 NOTE — Discharge Instructions (Addendum)
Rest at home with minimal activity for 1 week. No vigorous activity for 2 weeks. No ASA/NSAIDs or fish oil for 3 weeks.   Take blood pressure prior to taking Bisoprolol-hydrocholothiazide, do not take if systolic blood pressure is <120.  Recommendations for Outpatient Follow-up:  1. Follow up with PCP in 1-2 weeks 2. Please obtain BMP/CBC in one week 3. Resume BP medication, when BP allows it (see above)

## 2016-02-18 NOTE — Discharge Summary (Signed)
Physician Discharge Summary  [name] [name] [id number] DOB: 1950/05/19 DOA: 02/15/2016  PCP: [name], MD  Admit date: 02/15/2016 Discharge date: 02/18/2016  Admitted From: Home  Disposition:  Home   Recommendations for Outpatient Follow-up:  1. Follow up with PCP in 1-2 weeks 2. Please obtain BMP/CBC in one week 3. Resume BP medication, when BP allows it.     Discharge Condition: Stable.  CODE STATUS: Full code.  Diet recommendation: Heart Healthy  Brief/Interim Summary: 66 y.o.femalewith history of hypertension, hyperlipidemia, and colon polyps who presented with rectal bleeding.  She underwenta surveillance colonoscopy by Dr. Carlean Purl on 02/13/16, was found to have a polyp and diverticulosis, and the polyp was removed.  There was some bleeding at that time and the area was injectied w/ epi and 'clipped'. She had been doing well until she had abowel movement with frank rectal bleeding with a blood clot, followed by another episode of rectal bleeding.  Pt stopped taking ASA and mobic two weeks prior. In the ED hemoglobin was noted to have dropped from 13.0 on 10/08/15 >10.0.  Patient was admitted to stepdown and discussed w/ GI, Dr. Enis Gash.    Assessment & Plan:  Lower GI bleeding post polypectomy -GI consulted. Patient underwent colonoscopy 1-29 by Dr Forrestine Him, S/P epi injection and clips at prior polypectomy site.  -Hb this morning drop to 6.8 from 7. Received  total 2 units of PRBC.  - Consider IR evaluation if re-bleed per GI recommendation.  -hb has remain stable, she had small blood clot, GI think this are old blood clots. Plan to discharge patient today.    Anemia due to acute blood loss Secondary to GI bleed from recent polypectomy.  Received 2 units PRBC 1-30. Hb stable at 10.    Syncope ;  Had syncope 1-29 in the hospital.  Related to hypotension, anemia.  Resolved.  HLD Last LDL was 92 on 10/08/15 - not taking meds now - f/u with  PCP  HTN BP stable - follow . Hold BP medications at discharge to avoid hypotension.   Discharge Diagnoses:  Principal Problem:   Acute lower GI bleeding Active Problems:   History of colonic polyps   Hyperlipidemia   Hypertension   Anemia, iron deficiency   Acute blood loss anemia    Discharge Instructions  Discharge Instructions    Diet - low sodium heart healthy    Complete by:  As directed    Diet - low sodium heart healthy    Complete by:  As directed    Increase activity slowly    Complete by:  As directed    Increase activity slowly    Complete by:  As directed      Allergies as of 02/18/2016      Reactions   Amoxil [amoxicillin] Rash   Ampicillin Rash      Medication List    STOP taking these medications   aspirin 81 MG tablet   bisoprolol-hydrochlorothiazide 5-6.25 MG tablet Commonly known as:  ZIAC   meloxicam 15 MG tablet Commonly known as:  MOBIC     TAKE these medications   acetaminophen 325 MG tablet Commonly known as:  TYLENOL Take 2 tablets (650 mg total) by mouth every 6 (six) hours as needed for mild pain (or Fever >/= 101).   calcium carbonate 600 MG Tabs tablet Commonly known as:  OS-CAL Take 600 mg by mouth daily with breakfast.   fish oil-omega-3 fatty acids 1000 MG capsule Take 1 g by mouth  3 (three) times daily with meals.   FLAXSEED OIL PO Take 1 tablet by mouth 3 (three) times daily with meals.   MAGNESIUM PO Take 400 mg by mouth every morning.   multivitamin tablet Take 1 tablet by mouth every morning.   VITAMIN D PO Take 5,000 Units by mouth every morning.      Follow-up Information    MCKEOWN,WILLIAM DAVID, MD Follow up in 1 week(s).   Specialty:  Internal Medicine Contact information: [address] [place] [address] [phone]          Allergies  Allergen Reactions  . Amoxil [Amoxicillin] Rash  . Ampicillin Rash    Consultations:  GI   Procedures/Studies: Dg Bone  Density  Result Date: 01/21/2016 EXAM: DUAL X-RAY ABSORPTIOMETRY (DXA) FOR BONE MINERAL DENSITY IMPRESSION: Referring Physician:  Kem Boroughs PATIENT: Name: [name] [name], [name] [name] Patient ID: [id number] Birth Date: 03/31/1950 Height: 68.5 in. Sex: Female Measured: 01/21/2016 Weight: 144.0 lbs. Indications: Caucasian, Estrogen Deficient, Postmenopausal Fractures: None Treatments: Calcium (E943.0), Vitamin D (E933.5) ASSESSMENT: The BMD measured at Femur Total Left is 0.856 g/cm2 with a T-score of -1.2. This patient is considered osteopenic according to [facility]) criteria. L-3 was excluded due to degenerative changes. Site Region Measured Date Measured Age YA BMD Significant CHANGE T-score DualFemur Total Left 01/21/2016    65.2         -1.2    0.856 g/cm2 AP Spine  L1-L4 (L3) 01/21/2016    [age]         -0.6    1.105 g/cm2 World Health Organization [facility]) criteria for post-menopausal, Caucasian Women: Normal       T-score at or above -1 SD Osteopenia   T-score between -1 and -2.5 SD Osteoporosis T-score at or below -2.5 SD RECOMMENDATION: [place] recommends that FDA-approved medical therapies be considered in postmenopausal women and men age 29 or older with a: 1. Hip or vertebral (clinical or morphometric) fracture. 2. T-score of <-2.5 at the spine or hip. 3. Ten-year fracture probability by FRAX of 3% or greater for hip fracture or 20% or greater for major osteoporotic fracture. All treatment decisions require clinical judgment and consideration of individual patient factors, including patient preferences, co-morbidities, previous drug use, risk factors not captured in the FRAX model (e.g. falls, vitamin D deficiency, increased bone turnover, interval significant decline in bone density) and possible under - or over-estimation of fracture risk by FRAX. All patients should ensure an adequate intake of dietary calcium (1200 mg/d) and vitamin D (800 IU daily) unless  contraindicated. FOLLOW-UP: People with diagnosed cases of osteoporosis or at high risk for fracture should have regular bone mineral density tests. For patients eligible for Medicare, routine testing is allowed once every 2 years. The testing frequency can be increased to one year for patients who have rapidly progressing disease, those who are receiving or discontinuing medical therapy to restore bone mass, or have additional risk factors. I have reviewed this report, and agree with the above findings. [facility] FRAX* 10-year Probability of Fracture Based on femoral neck BMD: DualFemur (Left) Major Osteoporotic Fracture: 6.9% Hip Fracture:                0.4% Population:                  [place] (Caucasian) Risk Factors:                None *FRAX is a Materials engineer of the [employer] of  [place] for Metabolic Bone Disease, a World Pharmacologist (WHO) [place]. ASSESSMENT: The probability of a major osteoporotic fracture is 6.9 % within the next ten years. The probability of a hip fracture is 0.4 % within the next ten years. Electronically Signed   By: Kerby Moors M.D.   On: 01/21/2016 10:08   Dg Abd Acute W/chest  Result Date: 02/16/2016 CLINICAL DATA:  Abdominal bloating after colonoscopy 2 days prior. Rectal bleeding. EXAM: DG ABDOMEN ACUTE W/ 1V CHEST COMPARISON:  None. FINDINGS: The heart is normal in size. Mild aortic tortuosity. The lungs are clear. There is no free intra-abdominal air. No dilated bowel loops to suggest obstruction. Air and small volume of stool in the colon. Three small clips overlie the colon, 2 in the region of the ascending colon in the right mid and lower abdomen, 1 in the region of the sigmoid colon in the mid pelvis. No radiopaque calculi. No acute osseous abnormalities are seen. IMPRESSION: 1. No free air.  No evidence of bowel obstruction. 2. Air and small volume of stool throughout the colon, not unexpected post colonoscopy. 3.   No acute pulmonary process. Electronically Signed   By: Jeb Levering M.D.   On: 02/16/2016 01:28   Mm Screening Breast Tomo Bilateral  Result Date: 01/22/2016 CLINICAL DATA:  Screening. EXAM: 2D DIGITAL SCREENING BILATERAL MAMMOGRAM WITH CAD AND ADJUNCT TOMO COMPARISON:  Previous exam(s). ACR Breast Density Category b: There are scattered areas of fibroglandular density. FINDINGS: There are no findings suspicious for malignancy. Images were processed with CAD. IMPRESSION: No mammographic evidence of malignancy. A result letter of this screening mammogram will be mailed directly to the patient. RECOMMENDATION: Screening mammogram in one year. (Code:SM-B-01Y) BI-RADS CATEGORY  1: Negative. Electronically Signed   By: Margarette [place] M.D.   On: 01/22/2016 07:48     Subjective: Has small clot, evaluated by GI suspects this are old blood clots.   Discharge Exam: Vitals:   02/18/16 0844 02/18/16 1200  BP: (!) 117/43   Pulse: 64   Resp: (!) 27   Temp:  98.3 F (36.8 C)   Vitals:   02/18/16 0700 02/18/16 0800 02/18/16 0844 02/18/16 1200  BP:   (!) 117/43   Pulse: (!) 58 66 64   Resp: 17 18 (!) 27   Temp:  98.3 F (36.8 C)  98.3 F (36.8 C)  TempSrc:  Oral  Oral  SpO2: 92% 96% 97%   Weight:      Height:        General: Pt is alert, awake, not in acute distress Cardiovascular: RRR, S1/S2 +, no rubs, no gallops Respiratory: CTA bilaterally, no wheezing, no rhonchi Abdominal: Soft, NT, ND, bowel sounds + Extremities: no edema, no cyanosis    The results of significant diagnostics from this hospitalization (including imaging, microbiology, ancillary and laboratory) are listed below for reference.     Microbiology: Recent Results (from the past 240 hour(s))  MRSA PCR Screening     Status: None   Collection Time: 02/16/16  5:13 AM  Result Value Ref Range Status   MRSA by PCR NEGATIVE NEGATIVE Final    Comment:        The GeneXpert MRSA Assay (FDA approved for NASAL  specimens only), is one component of a comprehensive MRSA colonization surveillance program. It is not intended to diagnose MRSA infection nor to guide or monitor treatment for MRSA infections.      Labs: BNP (last 3 results) No  results for input(s): BNP in the last 8760 hours. Basic Metabolic Panel:  Recent Labs Lab 02/16/16 0036 02/16/16 0332 02/17/16 0359  NA 136 140 142  K 4.0 4.1 3.6  CL 104 105 113*  CO2 27 26 25   GLUCOSE 117* 120* 98  BUN 21* 20 7  CREATININE 0.75 0.65 0.59  CALCIUM 8.6* 8.8* 8.0*   Liver Function Tests:  Recent Labs Lab 02/16/16 0036 02/17/16 0359  AST 25 16  ALT 35 23  ALKPHOS 37* 28*  BILITOT 0.3 0.7  PROT 6.0* 4.8*  ALBUMIN 3.8 3.0*   No results for input(s): LIPASE, AMYLASE in the last 168 hours. No results for input(s): AMMONIA in the last 168 hours. CBC:  Recent Labs Lab 02/16/16 0036  02/16/16 [id number] 02/16/16 1543 02/16/16 2235 02/17/16 0359 02/17/16 1351 02/18/16 0352 02/18/16 1049  WBC 5.6  < > 6.7 6.6  --  4.5 5.3 5.2  --   NEUTROABS 3.9  --   --   --   --   --   --   --   --   HGB 10.0*  < > 9.0* 7.7* 7.4* 6.8* 10.5* 10.1* 10.4*  HCT 29.7*  < > 26.1* 22.5* 21.9* 20.1* 31.6* 29.2*  --   MCV 89.5  < > 91.3 89.3  --  90.1 88.3 89.3  --   PLT 182  < > 175 153  --  154 170 156  --   < > = values in this interval not displayed. Cardiac Enzymes: No results for input(s): CKTOTAL, CKMB, CKMBINDEX, TROPONINI in the last 168 hours. BNP: Invalid input(s): POCBNP CBG: No results for input(s): GLUCAP in the last 168 hours. D-Dimer No results for input(s): DDIMER in the last 72 hours. Hgb A1c No results for input(s): HGBA1C in the last 72 hours. Lipid Profile No results for input(s): CHOL, HDL, LDLCALC, TRIG, CHOLHDL, LDLDIRECT in the last 72 hours. Thyroid function studies No results for input(s): TSH, T4TOTAL, T3FREE, THYROIDAB in the last 72 hours.  Invalid input(s): FREET3 Anemia work up No results for input(s):  VITAMINB12, FOLATE, FERRITIN, TIBC, IRON, RETICCTPCT in the last 72 hours. Urinalysis    Component Value Date/Time   COLORURINE YELLOW 10/08/2015 1132   APPEARANCEUR CLEAR 10/08/2015 1132   LABSPEC 1.007 10/08/2015 1132   PHURINE 6.5 10/08/2015 1132   GLUCOSEU NEGATIVE 10/08/2015 1132   HGBUR NEGATIVE 10/08/2015 1132   BILIRUBINUR NEGATIVE 10/08/2015 1132   KETONESUR NEGATIVE 10/08/2015 1132   PROTEINUR NEGATIVE 10/08/2015 1132   NITRITE NEGATIVE 10/08/2015 1132   LEUKOCYTESUR NEGATIVE 10/08/2015 1132   Sepsis Labs Invalid input(s): PROCALCITONIN,  WBC,  LACTICIDVEN Microbiology Recent Results (from the past 240 hour(s))  MRSA PCR Screening     Status: None   Collection Time: 02/16/16  5:13 AM  Result Value Ref Range Status   MRSA by PCR NEGATIVE NEGATIVE Final    Comment:        The GeneXpert MRSA Assay (FDA approved for NASAL specimens only), is one component of a comprehensive MRSA colonization surveillance program. It is not intended to diagnose MRSA infection nor to guide or monitor treatment for MRSA infections.      Time coordinating discharge: Over 30 minutes  SIGNED:   Elmarie Shiley, MD  [facility] 02/18/2016, 1:35 PM Pager [phone]  If 7PM-7AM, please contact night-coverage [email or web address] Password [email or web address]

## 2016-02-18 NOTE — Telephone Encounter (Signed)
-----   Message from [place], [place] sent at 02/18/2016  1:47 PM EST ----- Regarding: Needs OV and rpt cbc  Hello,  This pt is being d/c'd today after post-polypectomy bleed. She will need repeat cbc in 1 week ordered and also follow-up appt with me or dr. Carlean Purl in 2 weeks. Can you please arrange and make sure she is aware, thank you-JLL

## 2016-02-18 NOTE — Progress Notes (Signed)
Pt discharged to home.  AVS and instructions given with teach back.  No further questions.

## 2016-02-18 NOTE — Telephone Encounter (Signed)
Appt with Ellouise Newer on 03/03/16 at 8:45 am My Chart message sent.  Pt aware to also get labs in 1 week.

## 2016-02-19 LAB — TYPE AND SCREEN
Blood Product Expiration Date: 201802072359
Blood Product Expiration Date: 201802072359
ISSUE DATE / TIME: 201801300616
ISSUE DATE / TIME: 201801300920
Unit Type and Rh: 6200
Unit Type and Rh: 6200

## 2016-02-19 NOTE — Progress Notes (Signed)
Spoke to patient about results 02/18/16

## 2016-02-20 ENCOUNTER — Telehealth: Payer: Self-pay | Admitting: Physician Assistant

## 2016-02-20 NOTE — Telephone Encounter (Signed)
Pt notified that she can come for labs at her convenience, as long as she has completed prior to appt. Pt agreed

## 2016-02-26 ENCOUNTER — Other Ambulatory Visit (INDEPENDENT_AMBULATORY_CARE_PROVIDER_SITE_OTHER): Payer: Medicare HMO

## 2016-02-26 DIAGNOSIS — K922 Gastrointestinal hemorrhage, unspecified: Secondary | ICD-10-CM

## 2016-02-26 LAB — CBC WITH DIFFERENTIAL/PLATELET
Basophils Absolute: 0 10*3/uL (ref 0.0–0.1)
Basophils Relative: 0.7 % (ref 0.0–3.0)
Eosinophils Absolute: 0.1 10*3/uL (ref 0.0–0.7)
Eosinophils Relative: 1.9 % (ref 0.0–5.0)
HCT: 33.6 % — ABNORMAL LOW (ref 36.0–46.0)
Hemoglobin: 11.7 g/dL — ABNORMAL LOW (ref 12.0–15.0)
Lymphocytes Relative: 32.3 % (ref 12.0–46.0)
Lymphs Abs: 1.8 10*3/uL (ref 0.7–4.0)
MCHC: 34.6 g/dL (ref 30.0–36.0)
MCV: 91.2 fl (ref 78.0–100.0)
Monocytes Absolute: 0.4 10*3/uL (ref 0.1–1.0)
Monocytes Relative: 7.1 % (ref 3.0–12.0)
Neutro Abs: 3.3 10*3/uL (ref 1.4–7.7)
Neutrophils Relative %: 58 % (ref 43.0–77.0)
Platelets: 285 10*3/uL (ref 150.0–400.0)
RBC: 3.69 Mil/uL — ABNORMAL LOW (ref 3.87–5.11)
RDW: 12.9 % (ref 11.5–15.5)
WBC: 5.7 10*3/uL (ref 4.0–10.5)

## 2016-03-03 ENCOUNTER — Ambulatory Visit: Payer: Medicare HMO | Admitting: Physician Assistant

## 2016-03-04 ENCOUNTER — Encounter: Payer: Self-pay | Admitting: Internal Medicine

## 2016-03-04 ENCOUNTER — Ambulatory Visit (INDEPENDENT_AMBULATORY_CARE_PROVIDER_SITE_OTHER): Payer: Medicare HMO | Admitting: Internal Medicine

## 2016-03-04 VITALS — BP 130/68 | HR 62 | Temp 98.2°F | Resp 16 | Ht 68.0 in | Wt 142.0 lb

## 2016-03-04 DIAGNOSIS — K922 Gastrointestinal hemorrhage, unspecified: Secondary | ICD-10-CM | POA: Diagnosis not present

## 2016-03-04 LAB — CBC WITH DIFFERENTIAL/PLATELET
Basophils Absolute: 0 cells/uL (ref 0–200)
Basophils Relative: 0 %
Eosinophils Absolute: 49 cells/uL (ref 15–500)
Eosinophils Relative: 1 %
HCT: 36.3 % (ref 35.0–45.0)
Hemoglobin: 12 g/dL (ref 11.7–15.5)
Lymphocytes Relative: 31 %
Lymphs Abs: 1519 cells/uL (ref 850–3900)
MCH: 30.2 pg (ref 27.0–33.0)
MCHC: 33.1 g/dL (ref 32.0–36.0)
MCV: 91.4 fL (ref 80.0–100.0)
MPV: 10.5 fL (ref 7.5–12.5)
Monocytes Absolute: 294 cells/uL (ref 200–950)
Monocytes Relative: 6 %
Neutro Abs: 3038 cells/uL (ref 1500–7800)
Neutrophils Relative %: 62 %
Platelets: 311 10*3/uL (ref 140–400)
RBC: 3.97 MIL/uL (ref 3.80–5.10)
RDW: 13 % (ref 11.0–15.0)
WBC: 4.9 10*3/uL (ref 3.8–10.8)

## 2016-03-04 LAB — HEPATIC FUNCTION PANEL
ALT: 15 U/L (ref 6–29)
AST: 19 U/L (ref 10–35)
Albumin: 4.4 g/dL (ref 3.6–5.1)
Alkaline Phosphatase: 42 U/L (ref 33–130)
Bilirubin, Direct: 0.1 mg/dL (ref <=0.2)
Indirect Bilirubin: 0.4 mg/dL (ref 0.2–1.2)
Total Bilirubin: 0.5 mg/dL (ref 0.2–1.2)
Total Protein: 7.1 g/dL (ref 6.1–8.1)

## 2016-03-04 LAB — BASIC METABOLIC PANEL WITH GFR
BUN: 18 mg/dL (ref 7–25)
CO2: 27 mmol/L (ref 20–31)
Calcium: 9.7 mg/dL (ref 8.6–10.4)
Chloride: 103 mmol/L (ref 98–110)
Creat: 0.64 mg/dL (ref 0.50–0.99)
GFR, Est African American: >89 mL/min (ref >=60)
GFR, Est Non African American: >89 mL/min (ref >=60)
Glucose, Bld: 86 mg/dL (ref 65–99)
Potassium: 4.4 mmol/L (ref 3.5–5.3)
Sodium: 139 mmol/L (ref 135–146)

## 2016-03-04 NOTE — Progress Notes (Signed)
Patient ID: [name] [name], female   DOB: 10/15/1950, 66 y.o.   MRN: [id number]  Assessment and Plan:  1. Lower GI bleed -resolved -will recheck CBC and BMET for improvement of kidney functions and hemoglobin. -patient aware to let us know if further bleeding - CBC with Differential/Platelet - BASIC METABOLIC PANEL WITH GFR - Hepatic function panel    Over 40 minutes of exam, counseling, chart review, and complex, high/moderate level critical decision making was performed this visit.   HPI 66 y.o.female presents for follow up from the hospital. Admit date to the hospital was 02/15/16, patient was discharged from the hospital on 02/18/16 and our office contacted the office the day after discharge to set up a follow up appointment, patient was admitted for: an acute lower GI bleed after a polypectomy that was done during a routine colonoscopy on 02/13/16.  Patient was seen in the ER on 02/15/16 for syncope and lower GI bleed.  While admitted patient received a transfusion of 2 units PRBC and underwent diagnostic colonoscopy which showed bleeding at the site of the polypectomy and the clip previously placed was no longer there.  A new clip was placed and epinephrine was injected to help stop bleeding.  Hypotension and anemia were resolved after transfusion and fluids.  Patient has been doing well since she was discharged home.  She reports that she has rested for the last two weeks. She reports that she tried to take it easy and not do any heavy lifting.  She has not gotten back to her usual routine.  She is afraid to lift her granddaughter.  She has not had any further blood or black color to her stools.  No nausea or vomiting.  She reports that she is still taking tylenol regularly.  She reports that she has some mild pain in her back.  She reports that when she gets up and moves around it doesn't bother her.  She is not having dizziness, lightheadedness.  She does still have some mild orthostasis.  This  has only   Images while in the hospital: Dg Abd Acute W/chest  Result Date: 02/16/2016 CLINICAL DATA:  Abdominal bloating after colonoscopy 2 days prior. Rectal bleeding. EXAM: DG ABDOMEN ACUTE W/ 1V CHEST COMPARISON:  None. FINDINGS: The heart is normal in size. Mild aortic tortuosity. The lungs are clear. There is no free intra-abdominal air. No dilated bowel loops to suggest obstruction. Air and small volume of stool in the colon. Three small clips overlie the colon, 2 in the region of the ascending colon in the right mid and lower abdomen, 1 in the region of the sigmoid colon in the mid pelvis. No radiopaque calculi. No acute osseous abnormalities are seen. IMPRESSION: 1. No free air.  No evidence of bowel obstruction. 2. Air and small volume of stool throughout the colon, not unexpected post colonoscopy. 3.  No acute pulmonary process. Electronically Signed   By: Jeb Levering M.D.   On: 02/16/2016 01:28    Past Medical History:  Diagnosis Date  . Heart murmur   . Hyperlipidemia   . Hypertension   . Prediabetes   . Seasonal allergies   . Vitamin D deficiency      Allergies  Allergen Reactions  . Amoxil [Amoxicillin] Rash  . Ampicillin Rash      Current Outpatient Prescriptions on File Prior to Visit  Medication Sig Dispense Refill  . acetaminophen (TYLENOL) 325 MG tablet Take 2 tablets (650 mg total) by mouth every 6 (  six) hours as needed for mild pain (or Fever >/= 101). 30 tablet 0  . calcium carbonate (OS-CAL) 600 MG TABS tablet Take 600 mg by mouth daily with breakfast.    . Cholecalciferol (VITAMIN D PO) Take 5,000 Units by mouth every morning.     . fish oil-omega-3 fatty acids 1000 MG capsule Take 1 g by mouth 3 (three) times daily with meals.     . Flaxseed, Linseed, (FLAXSEED OIL PO) Take 1 tablet by mouth 3 (three) times daily with meals.     Marland Kitchen MAGNESIUM PO Take 400 mg by mouth every morning.     . Multiple Vitamin (MULTIVITAMIN) tablet Take 1 tablet by mouth every  morning.      No current facility-administered medications on file prior to visit.     Review of Systems  Constitutional: Negative for chills, fever and malaise/fatigue.  HENT: Negative for congestion, ear pain and sore throat.   Eyes: Negative.   Respiratory: Negative for cough, shortness of breath and wheezing.   Cardiovascular: Negative for chest pain, palpitations and leg swelling.  Gastrointestinal: Negative for abdominal pain, blood in stool, constipation, diarrhea, heartburn and melena.  Genitourinary: Negative.   Skin: Negative.   Neurological: Negative for dizziness, sensory change, loss of consciousness and headaches.  Psychiatric/Behavioral: Negative for depression. The patient is not nervous/anxious and does not have insomnia.      Physical Exam: Filed Weights   03/04/16 1056  Weight: 142 lb (64.4 kg)   BP 130/68   Pulse 62   Temp 98.2 F (36.8 C) (Temporal)   Resp 16   Ht 5\' 8"  (1.727 m)   Wt 142 lb (64.4 kg)   LMP 11/17/2002   BMI 21.59 kg/m  General Appearance: Well nourished, in no apparent distress. Eyes: PERRLA, EOMs, conjunctiva no swelling or erythema Sinuses: No Frontal/maxillary tenderness ENT/Mouth: Ext aud canals clear, TMs without erythema, bulging. No erythema, swelling, or exudate on post pharynx.  Tonsils not swollen or erythematous. Hearing normal.  Neck: Supple, thyroid normal.  Respiratory: Respiratory effort normal, BS equal bilaterally without rales, rhonchi, wheezing or stridor.  Cardio: RRR with no MRGs. Brisk peripheral pulses without edema.  Abdomen: Soft, + BS.  Non tender, no guarding, rebound, hernias, masses. Lymphatics: Non tender without lymphadenopathy.  Musculoskeletal: Full ROM, 5/5 strength, normal gait.  Skin: Warm, dry without rashes, lesions, ecchymosis.  Neuro: Cranial nerves intact. Normal muscle tone, no cerebellar symptoms. Sensation intact.  Psych: Awake and oriented X 3, normal affect, Insight and Judgment  appropriate.     Starlyn Skeans, PA-C 11:11 AM [facility]

## 2016-03-18 ENCOUNTER — Encounter: Payer: Self-pay | Admitting: Internal Medicine

## 2016-03-18 ENCOUNTER — Ambulatory Visit (INDEPENDENT_AMBULATORY_CARE_PROVIDER_SITE_OTHER): Payer: Medicare HMO | Admitting: Internal Medicine

## 2016-03-18 VITALS — BP 122/64 | HR 62 | Temp 98.0°F | Resp 14 | Ht 68.0 in | Wt 142.0 lb

## 2016-03-18 DIAGNOSIS — I493 Ventricular premature depolarization: Secondary | ICD-10-CM | POA: Diagnosis not present

## 2016-03-18 MED ORDER — PROPRANOLOL HCL 20 MG PO TABS: 20.0000 mg | ORAL_TABLET | Freq: Every day | ORAL | 0 refills | Status: DC

## 2016-03-18 NOTE — Patient Instructions (Signed)
Premature Ventricular Contraction A premature ventricular contraction (PVC) is a common irregularity in the normal heart rhythm. These contractions are extra heartbeats that start in the heart ventricles and occur too early in the normal sequence. During the PVC, the heart's normal electrical pathway is not used, so the beat is shorter and less effective. In most cases, these contractions come and go and do not require treatment. What are the causes? In many cases, the cause may not be known. Common causes of the condition include:  Smoking.  Drinking alcohol.  Caffeine.  Certain medicines.  Some illegal drugs.  Stress. Certain medical conditions can also cause PVCs:  Changes in minerals in the blood (electrolytes).  Heart failure.  Heart valve problems.  Low blood oxygen levels or high carbon dioxide levels.  Heart attack, or coronary artery disease. What are the signs or symptoms? The main symptom of this condition is a fast or skipped heartbeat (palpitations). Other symptoms include:  Chest pain.  Shortness of breath.  Feeling tired.  Dizziness. In some cases, there are no symptoms. How is this diagnosed? This condition may be diagnosed based on:  Your medical history.  A physical exam. During the exam, the health care provider will check for irregular heartbeats.  Tests, such as:  An ECG (electrocardiogram) to monitor the electrical activity of your heart.  Holter monitor testing. This involves wearing a device that clips to your clothing and monitors the electrical activity of your heart over longer periods of time.  Stress tests to see how exercise affects your heart rhythm and blood supply.  Echocardiogram. This test uses sound waves (ultrasound) to produce an image of your heart.  Electrophysiology study. This test checks the electric pathways in your heart. How is this treated? Treatment depends on any underlying conditions, the type of PVCs that you  are having, and how much the symptoms are interfering with your daily life. Possible treatments include:  Avoiding things that can trigger the premature contractions, such as caffeine or alcohol.  Medicines. These may be given if symptoms are severe or if the extra heartbeats are frequent.  Treatment for any underlying condition that is found to be the cause of the contractions.  Catheter ablation. This procedure destroys the heart tissues that send abnormal signals. In some cases, no treatment is required. Follow these instructions at home: Lifestyle  Follow these instructions as told by your health care provider:  Do not use any products that contain nicotine or tobacco, such as cigarettes and e-cigarettes. If you need help quitting, ask your health care provider.  If caffeine triggers episodes of PVC, do not eat, drink, or use anything with caffeine in it.  If caffeine does not seem to trigger episodes, consume caffeine in moderation.  If alcohol triggers episodes of PVC, do not drink alcohol.  If alcohol does not seem to trigger episodes, limit alcohol intake to no more than 1 drink a day for nonpregnant women and 2 drinks a day for men. One drink equals 12 oz of beer, 5 oz of wine, or 1 oz of hard liquor.  Exercise regularly. Ask your health care provider what type of exercise is safe for you.  Find healthy ways to manage stress. Avoid stressful situations when possible.  Try to get at least 7-9 hours of sleep each night, or as much as recommended by your health care provider.  Do not use illegal drugs. General instructions   Take over-the-counter and prescription medicines only as told by your  health care provider.  Keep all follow-up visits as told by your health care provider. This is important. Get help right away if:  You feel palpitations that are frequent or continual.  You have chest pain.  You have shortness of breath.  You have sweating for no  reason.  You have nausea and vomiting.  You become light-headed or you faint. This information is not intended to replace advice given to you by your health care provider. Make sure you discuss any questions you have with your health care provider. Document Released: 08/22/2003 Document Revised: 08/29/2015 Document Reviewed: 06/11/2015 Elsevier Interactive Patient Education  2017 [employer].  Propranolol tablets What is this medicine? PROPRANOLOL (proe PRAN oh lole) is a beta-blocker. Beta-blockers reduce the workload on the heart and help it to beat more regularly. This medicine is used to treat high blood pressure, to control irregular heart rhythms (arrhythmias) and to relieve chest pain caused by angina. It may also be helpful after a heart attack. This medicine is also used to prevent migraine headaches, relieve uncontrollable shaking (tremors), and help certain problems related to the thyroid gland and adrenal gland. This medicine may be used for other purposes; ask your health care provider or pharmacist if you have questions. COMMON BRAND NAME(S): Inderal What should I tell my health care provider before I take this medicine? They need to know if you have any of these conditions: -circulation problems or blood vessel disease -diabetes -history of heart attack or heart disease, vasospastic angina -kidney disease -liver disease -lung or breathing disease, like asthma or emphysema -pheochromocytoma -slow heart rate -thyroid disease -an unusual or allergic reaction to propranolol, other beta-blockers, medicines, foods, dyes, or preservatives -pregnant or trying to get pregnant -breast-feeding How should I use this medicine? Take this medicine by mouth with a glass of water. Follow the directions on the prescription label. Take your doses at regular intervals. Do not take your medicine more often than directed. Do not stop taking except on your the advice of your doctor or health  care professional. Talk to your pediatrician regarding the use of this medicine in children. Special care may be needed. Overdosage: If you think you have taken too much of this medicine contact a poison control center or emergency room at once. NOTE: This medicine is only for you. Do not share this medicine with others. What if I miss a dose? If you miss a dose, take it as soon as you can. If it is almost time for your next dose, take only that dose. Do not take double or extra doses. What may interact with this medicine? Do not take this medicine with any of the following medications: -feverfew -phenothiazines like chlorpromazine, mesoridazine, prochlorperazine, thioridazine This medicine may also interact with the following medications: -aluminum hydroxide gel -antipyrine -antiviral medicines for HIV or AIDS -barbiturates like phenobarbital -certain medicines for blood pressure, heart disease, irregular heart beat -cimetidine -ciprofloxacin -diazepam -fluconazole -haloperidol -isoniazid -medicines for cholesterol like cholestyramine or colestipol -medicines for mental depression -medicines for migraine headache like almotriptan, eletriptan, frovatriptan, naratriptan, rizatriptan, sumatriptan, zolmitriptan -NSAIDs, medicines for pain and inflammation, like ibuprofen or naproxen -phenytoin -rifampin -teniposide -theophylline -thyroid medicines -tolbutamide -warfarin -zileuton This list may not describe all possible interactions. Give your health care provider a list of all the medicines, herbs, non-prescription drugs, or dietary supplements you use. Also tell them if you smoke, drink alcohol, or use illegal drugs. Some items may interact with your medicine. What should I watch for while  using this medicine? Visit your doctor or health care professional for regular check ups. Check your blood pressure and pulse rate regularly. Ask your health care professional what your blood  pressure and pulse rate should be, and when you should contact them. You may get drowsy or dizzy. Do not drive, use machinery, or do anything that needs mental alertness until you know how this drug affects you. Do not stand or sit up quickly, especially if you are an older patient. This reduces the risk of dizzy or fainting spells. Alcohol can make you more drowsy and dizzy. Avoid alcoholic drinks. This medicine can affect blood sugar levels. If you have diabetes, check with your doctor or health care professional before you change your diet or the dose of your diabetic medicine. Do not treat yourself for coughs, colds, or pain while you are taking this medicine without asking your doctor or health care professional for advice. Some ingredients may increase your blood pressure. What side effects may I notice from receiving this medicine? Side effects that you should report to your doctor or health care professional as soon as possible: -allergic reactions like skin rash, itching or hives, swelling of the face, lips, or tongue -breathing problems -changes in blood sugar -cold hands or feet -difficulty sleeping, nightmares -dry peeling skin -hallucinations -muscle cramps or weakness -slow heart rate -swelling of the legs and ankles -vomiting Side effects that usually do not require medical attention (report to your doctor or health care professional if they continue or are bothersome): -change in sex drive or performance -diarrhea -dry sore eyes -hair loss -nausea -weak or tired This list may not describe all possible side effects. Call your doctor for medical advice about side effects. You may report side effects to FDA at 1-800-FDA-1088. Where should I keep my medicine? Keep out of the reach of children. Store at room temperature between 15 and 30 degrees C (59 and 86 degrees F). Protect from light. Throw away any unused medicine after the expiration date. NOTE: This sheet is a summary.  It may not cover all possible information. If you have questions about this medicine, talk to your doctor, pharmacist, or health care provider.  2018 Elsevier/Gold Standard (2012-09-08 14:51:53)

## 2016-03-18 NOTE — Progress Notes (Signed)
Assessment and Plan:   1. PVC (premature ventricular contraction) -EKG unremarkable and unchanged -BP on the lower end of the spectrum -will try very short acting 20 mg propranolol 30 min prior to bedtime -have something near bedside table       HPI 66 y.o.female presents for evaluation of blood pressure and palpitations.  Previously patient was on bisoprolol 2.5 mg for palpitations and hypertension.  At her last visit blood pressure was slightly low and her ziac was stopped.  Since that time her blood pressure has continued to be on the lower end.  After her colonoscopy she had blood pressures running  Very rarely over 120/80.  She reports that the palpitations are fluttering sensations and then they go away.  She reports that she will get up when she gets up this feels slightly better.  She is never woken from sleep by the palpatitations and she does not get chest pain.  She does not get the palpitations much during the day as much.  Her last time taking the medication was a couple weeks ago.   Past Medical History:  Diagnosis Date  . Heart murmur   . Hyperlipidemia   . Hypertension   . Prediabetes   . Seasonal allergies   . Vitamin D deficiency      Allergies  Allergen Reactions  . Amoxil [Amoxicillin] Rash  . Ampicillin Rash      Current Outpatient Prescriptions on File Prior to Visit  Medication Sig Dispense Refill  . acetaminophen (TYLENOL) 325 MG tablet Take 2 tablets (650 mg total) by mouth every 6 (six) hours as needed for mild pain (or Fever >/= 101). 30 tablet 0  . calcium carbonate (OS-CAL) 600 MG TABS tablet Take 600 mg by mouth daily with breakfast.    . Cholecalciferol (VITAMIN D PO) Take 5,000 Units by mouth every morning.     . fish oil-omega-3 fatty acids 1000 MG capsule Take 1 g by mouth 3 (three) times daily with meals.     . Flaxseed, Linseed, (FLAXSEED OIL PO) Take 1 tablet by mouth 3 (three) times daily with meals.     Marland Kitchen MAGNESIUM PO Take 400 mg by mouth  every morning.     . Multiple Vitamin (MULTIVITAMIN) tablet Take 1 tablet by mouth every morning.      No current facility-administered medications on file prior to visit.     ROS: all negative except above.   Physical Exam: Filed Weights   03/18/16 1609  Weight: 142 lb (64.4 kg)   BP 122/64   Pulse 62   Temp 98 F (36.7 C) (Temporal)   Resp 14   Ht 5\' 8"  (1.727 m)   Wt 142 lb (64.4 kg)   LMP 11/17/2002   BMI 21.59 kg/m  General Appearance: Well developed well nourished, non-toxic appearing in no apparent distress. Eyes: PERRLA, EOMs, conjunctiva w/ no swelling or erythema or discharge Sinuses: No Frontal/maxillary tenderness ENT/Mouth: Ear canals clear without swelling or erythema.  TM's normal bilaterally with no retractions, bulging, or loss of landmarks.   Neck: Supple, thyroid normal, no notable JVD  Respiratory: Respiratory effort normal, Clear breath sounds anteriorly and posteriorly bilaterally without rales, rhonchi, wheezing or stridor. No retractions or accessory muscle usage. Cardio: RRR with no MRGs.   Abdomen: Soft, + BS.  Non tender, no guarding, rebound, hernias, masses.  Musculoskeletal: Full ROM, 5/5 strength, normal gait.  Skin: Warm, dry without rashes  Neuro: Awake and oriented X 3, Cranial nerves intact.  Normal muscle tone, no cerebellar symptoms. Sensation intact.  Psych: normal affect, Insight and Judgment appropriate.     Starlyn Skeans, PA-C 4:16 PM [facility]

## 2016-04-12 ENCOUNTER — Ambulatory Visit (INDEPENDENT_AMBULATORY_CARE_PROVIDER_SITE_OTHER): Payer: Medicare HMO | Admitting: Internal Medicine

## 2016-04-12 ENCOUNTER — Encounter: Payer: Self-pay | Admitting: Internal Medicine

## 2016-04-12 VITALS — BP 129/68 | HR 56 | Temp 98.2°F | Resp 16 | Ht 68.0 in

## 2016-04-12 DIAGNOSIS — J069 Acute upper respiratory infection, unspecified: Secondary | ICD-10-CM | POA: Diagnosis not present

## 2016-04-12 MED ORDER — FLUTICASONE PROPIONATE 50 MCG/ACT NA SUSP: 2.0000 | Freq: Every day | NASAL | 0 refills | Status: DC

## 2016-04-12 MED ORDER — PROMETHAZINE-DM 6.25-15 MG/5ML PO SYRP: 5.0000 mL | ORAL_SOLUTION | Freq: Four times a day (QID) | ORAL | 0 refills | Status: DC | PRN

## 2016-04-12 MED ORDER — DEXAMETHASONE SODIUM PHOSPHATE 10 MG/ML IJ SOLN
10.0000 mg | Freq: Once | INTRAMUSCULAR | Status: AC
  Administered 2016-04-12: 10 mg via INTRAMUSCULAR

## 2016-04-12 MED ORDER — AZITHROMYCIN 250 MG PO TABS: ORAL_TABLET | ORAL | 0 refills | Status: DC

## 2016-04-12 NOTE — Progress Notes (Signed)
HPI  Patient presents to the office for evaluation of cough.  It has been going on for 1 weeks.  Patient reports night > day, dry, barky, worse with lying down.  They also endorse change in voice, chills, postnasal drip, shortness of breath, wheezing and chest tightness, clear and yellow rhinorrhea, sore throat, left ear pain and congestion.  .  They have tried antitussives mucinex and tylenol.  They report that nothing has worked.  They admits to other sick contacts.  Her husband has also been sick with similar symptoms.    Review of Systems  Constitutional: Positive for malaise/fatigue. Negative for chills and fever.  HENT: Positive for congestion, ear pain, hearing loss and sore throat.   Respiratory: Positive for cough. Negative for sputum production, shortness of breath and wheezing.   Cardiovascular: Negative for chest pain, palpitations and leg swelling.  Neurological: Positive for headaches.    PE:  Vitals:   04/12/16 1117  BP: 129/68  Pulse: (!) 56  Resp: 16  Temp: 98.2 F (36.8 C)    General:  Alert and non-toxic, WDWN, NAD HEENT: NCAT, PERLA, EOM normal, no occular discharge or erythema.  Nasal mucosal edema with sinus tenderness to palpation.  Oropharynx clear with minimal oropharyngeal edema and erythema.  Mucous membranes moist and pink. Neck:  Cervical adenopathy Chest:  RRR no MRGs.  Lungs clear to auscultation A&P with no wheezes rhonchi or rales.   Abdomen: +BS x 4 quadrants, soft, non-tender, no guarding, rigidity, or rebound. Skin: warm and dry no rash Neuro: A&Ox4, CN II-XII grossly intact  Assessment and Plan:  1. Acute URI -given recent GI bleed will avoid oral administration of steroids and NSAIDs -cont nasal saline -start allegra once daily -cont phenergan dm - dexamethasone (DECADRON) injection 10 mg; Inject 1 mL (10 mg total) into the muscle once. - azithromycin (ZITHROMAX Z-PAK) 250 MG tablet; 2 po day one, then 1 daily x 4 days  Dispense: 6 tablet;  Refill: 0 - fluticasone (FLONASE) 50 MCG/ACT nasal spray; Place 2 sprays into both nostrils daily.  Dispense: 16 g; Refill: 0 -breo sample x 2 weeks, rinse mouth after use.  She is to call if she develops white film or pain in mouth -follow-up as needed or if symptoms worsen

## 2016-04-14 NOTE — Addendum Note (Signed)
Addended by: Ellisa Devivo A on: 04/14/2016 11:38 AM   Modules accepted: Orders

## 2016-05-14 ENCOUNTER — Other Ambulatory Visit: Payer: Self-pay

## 2016-05-14 MED ORDER — PROPRANOLOL HCL 20 MG PO TABS: 20.0000 mg | ORAL_TABLET | Freq: Every day | ORAL | 0 refills | Status: DC

## 2016-05-26 DIAGNOSIS — R69 Illness, unspecified: Secondary | ICD-10-CM | POA: Diagnosis not present

## 2016-06-01 ENCOUNTER — Encounter: Payer: Self-pay | Admitting: *Deleted

## 2016-06-18 NOTE — Addendum Note (Signed)
Addendum  created 06/18/16 1009 by Rica Koyanagi, MD   Sign clinical note

## 2016-06-24 ENCOUNTER — Other Ambulatory Visit: Payer: Self-pay | Admitting: Physician Assistant

## 2016-07-08 ENCOUNTER — Ambulatory Visit: Payer: Self-pay | Admitting: Internal Medicine

## 2016-07-18 NOTE — Progress Notes (Signed)
6 MONTH FOLLOW UP  Assessment:    Essential hypertension - continue medications, DASH diet, exercise and monitor at home. Call if greater than 130/80.  -     CBC with Differential/Platelet -     BASIC METABOLIC PANEL WITH GFR -     Hepatic function panel -     TSH  Hyperlipidemia, unspecified hyperlipidemia type -continue medications, check lipids, decrease fatty foods, increase activity.  -     Lipid panel  Vitamin D deficiency  Prediabetes Discussed general issues about diabetes pathophysiology and management., Educational material distributed., Suggested low cholesterol diet., Encouraged aerobic exercise., Discussed foot care., Reminded to get yearly retinal exam. -     Hemoglobin A1c  Medication management -     Magnesium    Over 30 minutes of exam, counseling, chart review and critical decision making was performed Future Appointments Date Time Provider [place]  11/08/2016 10:00 AM Unk Pinto, MD [facility] None  12/01/2016 9:30 AM Regina Eck, CNM [place] None        Subjective:  [name] [name] is a 66 y.o. female who presents for  6 month follow up Her blood pressure has been controlled at home, today their BP is BP: 138/74  She does not workout, watches 37 year old grand daughter.  She denies chest pain, shortness of breath, dizziness. Patient had lower GI bleed in Jan after routine colonoscopy with a polpectomy doing better at this time.   She is not on cholesterol medication and denies myalgias. Her cholesterol is at goal. The cholesterol last visit was:   Lab Results  Component Value Date   CHOL 177 10/08/2015   HDL 62 10/08/2015   LDLCALC 92 10/08/2015   TRIG 116 10/08/2015   CHOLHDL 2.9 10/08/2015    She has been working on diet and exercise for prediabetes, and denies paresthesia of the feet, polydipsia, polyuria and visual disturbances. Last A1C in the office was:  Lab Results  Component Value Date   HGBA1C 5.6 10/08/2015    Patient is on Vitamin D supplement.   Lab Results  Component Value Date   VD25OH 63 10/08/2015     BMI is Body mass index is 22.35 kg/m., she is working on diet and exercise. Wt Readings from Last 3 Encounters:  07/20/16 147 lb (66.7 kg)  03/18/16 142 lb (64.4 kg)  03/04/16 142 lb (64.4 kg)    Medication Review: Current Outpatient Prescriptions on File Prior to Visit  Medication Sig Dispense Refill  . acetaminophen (TYLENOL) 325 MG tablet Take 2 tablets (650 mg total) by mouth every 6 (six) hours as needed for mild pain (or Fever >/= 101). 30 tablet 0  . azithromycin (ZITHROMAX Z-PAK) 250 MG tablet 2 po day one, then 1 daily x 4 days 6 tablet 0  . calcium carbonate (OS-CAL) 600 MG TABS tablet Take 600 mg by mouth daily with breakfast.    . Cholecalciferol (VITAMIN D PO) Take 5,000 Units by mouth every morning.     . fish oil-omega-3 fatty acids 1000 MG capsule Take 1 g by mouth 3 (three) times daily with meals.     . Flaxseed, Linseed, (FLAXSEED OIL PO) Take 1 tablet by mouth 3 (three) times daily with meals.     . fluticasone (FLONASE) 50 MCG/ACT nasal spray Place 2 sprays into both nostrils daily. 16 g 0  . guaiFENesin (MUCINEX) 600 MG 12 hr tablet Take 600 mg by mouth 2 (two) times daily.    Marland Kitchen  hydrochlorothiazide (HYDRODIURIL) 25 MG tablet Take 25 mg by mouth daily as needed.    Marland Kitchen MAGNESIUM PO Take 400 mg by mouth every morning.     . Multiple Vitamin (MULTIVITAMIN) tablet Take 1 tablet by mouth every morning.     . promethazine-dextromethorphan (PROMETHAZINE-DM) 6.25-15 MG/5ML syrup Take 5 mLs by mouth 4 (four) times daily as needed for cough. 360 mL 0  . propranolol (INDERAL) 20 MG tablet TAKE 1 TABLET BY MOUTH AT BEDTIME 90 tablet 1   No current facility-administered medications on file prior to visit.     Allergies  Allergen Reactions  . Amoxil [Amoxicillin] Rash  . Ampicillin Rash    Current Problems (verified) Patient Active Problem List   Diagnosis Date Noted   . Acute blood loss anemia   . Acute lower GI bleeding 02/16/2016  . Anemia, iron deficiency 10/16/2015  . Medication management 07/26/2014  . Hyperlipidemia   . Seasonal allergies   . Vitamin D deficiency   . Prediabetes   . Hypertension   . History of colonic polyps 03/23/2011    Review of Systems  Constitutional: Negative.   HENT: Negative.   Eyes: Negative.   Respiratory: Negative.   Cardiovascular: Negative.   Gastrointestinal: Negative.   Genitourinary: Negative.   Musculoskeletal: Negative.   Skin: Negative.      Objective:     Today's Vitals   07/20/16 0949  BP: 138/74  Pulse: 76  Temp: 98.4 F (36.9 C)  Weight: 147 lb (66.7 kg)  Height: 5\' 8"  (1.727 m)   Body mass index is 22.35 kg/m.  General appearance: alert, no distress, WD/WN, female HEENT: normocephalic, sclerae anicteric, TMs pearly, nares patent, no discharge or erythema, pharynx normal Oral cavity: MMM, no lesions Neck: supple, no lymphadenopathy, no thyromegaly, no masses Heart: RRR, normal S1, S2, no murmurs Lungs: CTA bilaterally, no wheezes, rhonchi, or rales Abdomen: +bs, soft, non tender, non distended, no masses, no hepatomegaly, no splenomegaly Musculoskeletal: nontender, no swelling, no obvious deformity Extremities: no edema, no cyanosis, no clubbing Pulses: 2+ symmetric, upper and lower extremities, normal cap refill Neurological: alert, oriented x 3, CN2-12 intact, strength normal upper extremities and lower extremities, sensation normal throughout, DTRs 2+ throughout, no cerebellar signs, gait normal Psychiatric: normal affect, behavior normal, pleasant   Vicie Mutters, PA-C   07/20/2016

## 2016-07-20 ENCOUNTER — Ambulatory Visit (INDEPENDENT_AMBULATORY_CARE_PROVIDER_SITE_OTHER): Payer: Medicare HMO | Admitting: Physician Assistant

## 2016-07-20 ENCOUNTER — Encounter: Payer: Self-pay | Admitting: Physician Assistant

## 2016-07-20 VITALS — BP 138/74 | HR 76 | Temp 98.4°F | Ht 68.0 in | Wt 147.0 lb

## 2016-07-20 DIAGNOSIS — I10 Essential (primary) hypertension: Secondary | ICD-10-CM | POA: Diagnosis not present

## 2016-07-20 DIAGNOSIS — R7303 Prediabetes: Secondary | ICD-10-CM

## 2016-07-20 DIAGNOSIS — Z79899 Other long term (current) drug therapy: Secondary | ICD-10-CM | POA: Diagnosis not present

## 2016-07-20 DIAGNOSIS — E559 Vitamin D deficiency, unspecified: Secondary | ICD-10-CM | POA: Diagnosis not present

## 2016-07-20 DIAGNOSIS — E785 Hyperlipidemia, unspecified: Secondary | ICD-10-CM

## 2016-07-20 LAB — CBC WITH DIFFERENTIAL/PLATELET
Basophils Absolute: 50 cells/uL (ref 0–200)
Basophils Relative: 1 %
Eosinophils Absolute: 100 cells/uL (ref 15–500)
Eosinophils Relative: 2 %
HCT: 37.1 % (ref 35.0–45.0)
Hemoglobin: 12.2 g/dL (ref 11.7–15.5)
Lymphocytes Relative: 35 %
Lymphs Abs: 1750 cells/uL (ref 850–3900)
MCH: 30.4 pg (ref 27.0–33.0)
MCHC: 32.9 g/dL (ref 32.0–36.0)
MCV: 92.5 fL (ref 80.0–100.0)
MPV: 10.2 fL (ref 7.5–12.5)
Monocytes Absolute: 300 cells/uL (ref 200–950)
Monocytes Relative: 6 %
Neutro Abs: 2800 cells/uL (ref 1500–7800)
Neutrophils Relative %: 56 %
Platelets: 246 10*3/uL (ref 140–400)
RBC: 4.01 MIL/uL (ref 3.80–5.10)
RDW: 13.6 % (ref 11.0–15.0)
WBC: 5 10*3/uL (ref 3.8–10.8)

## 2016-07-20 LAB — BASIC METABOLIC PANEL WITH GFR
BUN: 16 mg/dL (ref 7–25)
CO2: 27 mmol/L (ref 20–31)
Calcium: 9.4 mg/dL (ref 8.6–10.4)
Chloride: 103 mmol/L (ref 98–110)
Creat: 0.6 mg/dL (ref 0.50–0.99)
GFR, Est African American: >89 mL/min (ref >=60)
GFR, Est Non African American: >89 mL/min (ref >=60)
Glucose, Bld: 83 mg/dL (ref 65–99)
Potassium: 4.4 mmol/L (ref 3.5–5.3)
Sodium: 140 mmol/L (ref 135–146)

## 2016-07-20 LAB — HEPATIC FUNCTION PANEL
ALT: 16 U/L (ref 6–29)
AST: 20 U/L (ref 10–35)
Albumin: 4.4 g/dL (ref 3.6–5.1)
Alkaline Phosphatase: 46 U/L (ref 33–130)
Bilirubin, Direct: 0.1 mg/dL (ref <=0.2)
Indirect Bilirubin: 0.6 mg/dL (ref 0.2–1.2)
Total Bilirubin: 0.7 mg/dL (ref 0.2–1.2)
Total Protein: 6.8 g/dL (ref 6.1–8.1)

## 2016-07-20 LAB — TSH: TSH: 2.84 mIU/L

## 2016-07-20 LAB — LIPID PANEL
Cholesterol: 166 mg/dL (ref <200)
HDL: 59 mg/dL (ref >50)
LDL Cholesterol: 78 mg/dL (ref <100)
Total CHOL/HDL Ratio: 2.8 Ratio (ref <5.0)
Triglycerides: 147 mg/dL (ref <150)
VLDL: 29 mg/dL (ref <30)

## 2016-07-21 LAB — MAGNESIUM: Magnesium: 2 mg/dL (ref 1.5–2.5)

## 2016-07-21 LAB — HEMOGLOBIN A1C
Hgb A1c MFr Bld: 5.6 % (ref <5.7)
Mean Plasma Glucose: 114 mg/dL

## 2016-10-20 DIAGNOSIS — H10013 Acute follicular conjunctivitis, bilateral: Secondary | ICD-10-CM | POA: Diagnosis not present

## 2016-10-21 ENCOUNTER — Other Ambulatory Visit: Payer: Self-pay | Admitting: *Deleted

## 2016-10-21 ENCOUNTER — Telehealth: Payer: Self-pay

## 2016-10-21 MED ORDER — BISOPROLOL-HYDROCHLOROTHIAZIDE 5-6.25 MG PO TABS: 1.0000 | ORAL_TABLET | Freq: Every day | ORAL | 1 refills | Status: DC

## 2016-10-21 NOTE — Telephone Encounter (Signed)
PT REPORTS HIGH BP AT HOME TIMES 1 WK. 137/53, 144/55, & 161/57  SAID THAT SHE WAS TOLD TO CUT TABLET IN HALF BUT WANTS TO KNOW THAT DUE TO HER B/P AT HOME SHOULD SHE GO BACK TO 1 PILL A DAY.   PER PROVIDER YES CAN GO BACK UP TO 1/DAY

## 2016-11-07 NOTE — Patient Instructions (Signed)

## 2016-11-07 NOTE — Progress Notes (Signed)
[facility] Unk Pinto, M.D.     Uvaldo Bristle. Silverio Lay, P.A.-C Liane Comber, [place] [address] [place], [place] [address] Telephone [phone] Telefax [phone] Annual Screening/Preventative Visit & Comprehensive Evaluation &  Examination     This very nice 66 y.o. MWF presents for a Screening/Preventative Visit & comprehensive evaluation and management of multiple medical co-morbidities.  Patient has been followed for HTN, Prediabetes, Hyperlipidemia and Vitamin D Deficiency.      HTN predates since 2001. Patient's BP has been controlled at home and patient denies any cardiac symptoms as chest pain, palpitations, shortness of breath, dizziness or ankle swelling. Today's BP is at goal - 134/76.      Patient's hyperlipidemia is controlled with diet and supplements. Last lipids were at goal: Lab Results  Component Value Date   CHOL 166 07/20/2016   HDL 59 07/20/2016   LDLCALC 78 07/20/2016   TRIG 147 07/20/2016   CHOLHDL 2.8 07/20/2016      Patient has prediabetes predating since 2012 with A1c 6.0% and 5.7% in 2014, A1c 6.1% in 2016 and  5.9% in 2017.  She denies reactive hypoglycemic symptoms, visual blurring, diabetic polys, or paresthesias. Last A1c was normal and at goal: Lab Results  Component Value Date   HGBA1C 5.6 07/20/2016      Finally, patient has history of Vitamin D Deficiency of "24" in 2008 and last Vitamin D was at goal: Lab Results  Component Value Date   VD25OH 63 10/08/2015   Current Outpatient Prescriptions on File Prior to Visit  Medication Sig  . acetaminophen (TYLENOL) 325 MG tablet Take 2 tablets (650 mg total) by mouth every 6 (six) hours as needed for mild pain (or Fever >/= 101).  . bisoprolol-hydrochlorothiazide (ZIAC) 5-6.25 MG tablet Take 1 tablet by mouth daily.  . calcium carbonate (OS-CAL) 600 MG TABS tablet Take 600 mg by mouth daily with breakfast.  .  Cholecalciferol (VITAMIN D PO) Take 5,000 Units by mouth every morning.   . fish oil-omega-3 fatty acids 1000 MG capsule Take 1 g by mouth 3 (three) times daily with meals.   . Flaxseed, Linseed, (FLAXSEED OIL PO) Take 1 tablet by mouth 3 (three) times daily with meals.   Marland Kitchen MAGNESIUM PO Take 400 mg by mouth every morning.   . Multiple Vitamin (MULTIVITAMIN) tablet Take 1 tablet by mouth every morning.   . propranolol (INDERAL) 20 MG tablet TAKE 1 TABLET BY MOUTH AT BEDTIME  . hydrochlorothiazide (HYDRODIURIL) 25 MG tablet Take 25 mg by mouth daily as needed.   No current facility-administered medications on file prior to visit.    Allergies  Allergen Reactions  . Amoxil [Amoxicillin] Rash  . Ampicillin Rash   Past Medical History:  Diagnosis Date  . Heart murmur   . Hyperlipidemia   . Hypertension   . Prediabetes   . Seasonal allergies   . Vitamin D deficiency    Health Maintenance  Topic Date Due  . INFLUENZA VACCINE  08/18/2016  . PNA vac Low Risk Adult (2 of 2 - PPSV23) 01/07/2017  . MAMMOGRAM  01/20/2018  . COLONOSCOPY  02/15/2021  . TETANUS/TDAP  07/06/2022  . DEXA SCAN  Completed  . Hepatitis C Screening  Completed   Immunization History  Administered Date(s) Administered  . Influenza Split 01/03/2013  . Influenza, High Dose Seasonal PF 10/23/2015  . PPD Test 07/13/2013  . Pneumococcal Conjugate-13 01/08/2016  . Pneumococcal-Unspecified 01/18/2002  . Tdap  01/19/2003, 07/05/2012  . Zoster 07/05/2012   Past Surgical History:  Procedure Laterality Date  . CARDIAC SURGERY  1958   at age 57 for hole in heart  . CERVIX LESION DESTRUCTION N/A ? years ago   with abnormal pap  . COLONOSCOPY     polyps  . COLONOSCOPY N/A 02/16/2016   Procedure: COLONOSCOPY;  Surgeon: Ladene Artist, MD;  Location: [facility];  Service: Endoscopy;  Laterality: N/A;  . TUBAL LIGATION     Family History  Problem Relation Age of Onset  . Colon cancer Sister 43  . Diabetes Sister    . Diabetes Father   . Diabetes Brother   . Diabetes Sister   . Stomach cancer Neg Hx    Social History  Substance Use Topics  . Smoking status: Never Smoker  . Smokeless tobacco: Never Used  . Alcohol use 1.8 oz/week    3 Standard drinks or equivalent per week     Comment: occasional wine    ROS Constitutional: Denies fever, chills, weight loss/gain, headaches, insomnia,  night sweats, and change in appetite. Does c/o fatigue. Eyes: Denies redness, blurred vision, diplopia, discharge, itchy, watery eyes.  ENT: Denies discharge, congestion, post nasal drip, epistaxis, sore throat, earache, hearing loss, dental pain, Tinnitus, Vertigo, Sinus pain, snoring.  Cardio: Denies chest pain, palpitations, irregular heartbeat, syncope, dyspnea, diaphoresis, orthopnea, PND, claudication, edema Respiratory: denies cough, dyspnea, DOE, pleurisy, hoarseness, laryngitis, wheezing.  Gastrointestinal: Denies dysphagia, heartburn, reflux, water brash, pain, cramps, nausea, vomiting, bloating, diarrhea, constipation, hematemesis, melena, hematochezia, jaundice, hemorrhoids Genitourinary: Denies dysuria, frequency, urgency, nocturia, hesitancy, discharge, hematuria, flank pain Breast: Breast lumps, nipple discharge, bleeding.  Musculoskeletal: Denies arthralgia, myalgia, stiffness, Jt. Swelling, pain, limp, and strain/sprain. Denies falls. Skin: Denies puritis, rash, hives, warts, acne, eczema, changing in skin lesion Neuro: No weakness, tremor, incoordination, spasms, paresthesia, pain Psychiatric: Denies confusion, memory loss, sensory loss. Denies Depression. Endocrine: Denies change in weight, skin, hair change, nocturia, and paresthesia, diabetic polys, visual blurring, hyper / hypo glycemic episodes.  Heme/Lymph: No excessive bleeding, bruising, enlarged lymph nodes.  Physical Exam  BP 134/76   Pulse 72   Temp (!) 97.2 F (36.2 C)   Resp 18   Ht 5\' 8"  (1.727 m)   Wt 146 lb 6.4 oz (66.4 kg)    LMP 11/17/2002   BMI 22.26 kg/m   General Appearance: Well nourished, well groomed and in no apparent distress.  Eyes: PERRLA, EOMs, conjunctiva no swelling or erythema, normal fundi and vessels. Sinuses: No frontal/maxillary tenderness ENT/Mouth: EACs patent / TMs  nl. Nares clear without erythema, swelling, mucoid exudates. Oral hygiene is good. No erythema, swelling, or exudate. Tongue normal, non-obstructing. Tonsils not swollen or erythematous. Hearing normal.  Neck: Supple, thyroid normal. No bruits, nodes or JVD. Respiratory: Respiratory effort normal.  BS equal and clear bilateral without rales, rhonci, wheezing or stridor. Cardio: Heart sounds are normal with regular rate and rhythm and no murmurs, rubs or gallops. Peripheral pulses are normal and equal bilaterally without edema. No aortic or femoral bruits. Chest: symmetric with normal excursions and percussion. Breasts: Symmetric, without lumps, nipple discharge, retractions, or fibrocystic changes.  Abdomen: Flat, soft with bowel sounds active. Nontender, no guarding, rebound, hernias, masses, or organomegaly.  Lymphatics: Non tender without lymphadenopathy.  Musculoskeletal: Full ROM all peripheral extremities, joint stability, 5/5 strength, and normal gait. Skin: Warm and dry without rashes, lesions, cyanosis, clubbing or  ecchymosis.  Neuro: Cranial nerves intact, reflexes equal bilaterally. Normal muscle tone, no  cerebellar symptoms. Sensation intact.  Pysch: Alert and oriented X 3, normal affect, Insight and Judgment appropriate.   Assessment and Plan  1. Annual Preventative Screening Examination  2. Essential hypertension  - EKG 12-Lead - Urinalysis, Routine w reflex microscopic - Microalbumin / creatinine urine ratio - CBC with Differential/Platelet - BASIC METABOLIC PANEL WITH GFR - Magnesium - TSH  3. Hyperlipidemia, mixed  - EKG 12-Lead - Hepatic function panel - Lipid panel - TSH  4. Prediabetes  -  EKG 12-Lead - Hemoglobin A1c - Insulin, random  5. Vitamin D deficiency  - VITAMIN D 25 Hydroxy s)  6. Screening for rectal cancer  - POC Hemoccult Bld/Stl (  7. Screening for ischemic heart disease  - EKG 12-Lead  8. Medication management  - Urinalysis, Routine w reflex microscopic - Microalbumin / creatinine urine ratio - CBC with Differential/Platelet - BASIC METABOLIC PANEL WITH GFR - Hepatic function panel - Magnesium - Lipid panel - TSH - Hemoglobin A1c - Insulin, random - VITAMIN D 25 Hydroxy         Patient was counseled in prudent diet to achieve/maintain BMI less than 25 for weight control, BP monitoring, regular exercise and medications. Discussed med's effects and SE's. Screening labs and tests as requested with regular follow-up as recommended. Over 40 minutes of exam, counseling, chart review and high complex critical decision making was performed.

## 2016-11-08 ENCOUNTER — Ambulatory Visit (INDEPENDENT_AMBULATORY_CARE_PROVIDER_SITE_OTHER): Payer: Medicare HMO | Admitting: Internal Medicine

## 2016-11-08 ENCOUNTER — Encounter: Payer: Self-pay | Admitting: Internal Medicine

## 2016-11-08 VITALS — BP 134/76 | HR 72 | Temp 97.2°F | Resp 18 | Ht 68.0 in | Wt 146.4 lb

## 2016-11-08 DIAGNOSIS — I10 Essential (primary) hypertension: Secondary | ICD-10-CM

## 2016-11-08 DIAGNOSIS — R6889 Other general symptoms and signs: Secondary | ICD-10-CM | POA: Diagnosis not present

## 2016-11-08 DIAGNOSIS — Z23 Encounter for immunization: Secondary | ICD-10-CM | POA: Diagnosis not present

## 2016-11-08 DIAGNOSIS — Z79899 Other long term (current) drug therapy: Secondary | ICD-10-CM | POA: Diagnosis not present

## 2016-11-08 DIAGNOSIS — Z1212 Encounter for screening for malignant neoplasm of rectum: Secondary | ICD-10-CM

## 2016-11-08 DIAGNOSIS — Z136 Encounter for screening for cardiovascular disorders: Secondary | ICD-10-CM

## 2016-11-08 DIAGNOSIS — E782 Mixed hyperlipidemia: Secondary | ICD-10-CM | POA: Diagnosis not present

## 2016-11-08 DIAGNOSIS — R7303 Prediabetes: Secondary | ICD-10-CM

## 2016-11-08 DIAGNOSIS — E559 Vitamin D deficiency, unspecified: Secondary | ICD-10-CM | POA: Diagnosis not present

## 2016-11-08 DIAGNOSIS — Z0001 Encounter for general adult medical examination with abnormal findings: Secondary | ICD-10-CM | POA: Diagnosis not present

## 2016-11-09 LAB — CBC WITH DIFFERENTIAL/PLATELET
Basophils Absolute: 18 cells/uL (ref 0–200)
Basophils Relative: 0.4 %
Eosinophils Absolute: 59 cells/uL (ref 15–500)
Eosinophils Relative: 1.3 %
HCT: 38.9 % (ref 35.0–45.0)
Hemoglobin: 13.2 g/dL (ref 11.7–15.5)
Lymphs Abs: 1463 cells/uL (ref 850–3900)
MCH: 30.7 pg (ref 27.0–33.0)
MCHC: 33.9 g/dL (ref 32.0–36.0)
MCV: 90.5 fL (ref 80.0–100.0)
MPV: 11.5 fL (ref 7.5–12.5)
Monocytes Relative: 7.3 %
Neutro Abs: 2633 cells/uL (ref 1500–7800)
Neutrophils Relative %: 58.5 %
Platelets: 244 10*3/uL (ref 140–400)
RBC: 4.3 10*6/uL (ref 3.80–5.10)
RDW: 11.8 % (ref 11.0–15.0)
Total Lymphocyte: 32.5 %
WBC mixed population: 329 cells/uL (ref 200–950)
WBC: 4.5 10*3/uL (ref 3.8–10.8)

## 2016-11-09 LAB — VITAMIN D 25 HYDROXY (VIT D DEFICIENCY, FRACTURES): Vit D, 25-Hydroxy: 76 ng/mL (ref 30–100)

## 2016-11-09 LAB — BASIC METABOLIC PANEL WITH GFR
BUN: 16 mg/dL (ref 7–25)
CO2: 31 mmol/L (ref 20–32)
Calcium: 10.2 mg/dL (ref 8.6–10.4)
Chloride: 102 mmol/L (ref 98–110)
Creat: 0.72 mg/dL (ref 0.50–0.99)
GFR, Est African American: 101 mL/min/{1.73_m2} (ref >=60)
GFR, Est Non African American: 87 mL/min/{1.73_m2} (ref >=60)
Glucose, Bld: 103 mg/dL — ABNORMAL HIGH (ref 65–99)
Potassium: 4.3 mmol/L (ref 3.5–5.3)
Sodium: 140 mmol/L (ref 135–146)

## 2016-11-09 LAB — URINALYSIS, ROUTINE W REFLEX MICROSCOPIC
Bilirubin Urine: NEGATIVE
Glucose, UA: NEGATIVE
Hgb urine dipstick: NEGATIVE
Ketones, ur: NEGATIVE
Leukocytes, UA: NEGATIVE
Nitrite: NEGATIVE
Protein, ur: NEGATIVE
Specific Gravity, Urine: 1.007 (ref 1.001–1.03)
pH: 6.5 (ref 5.0–8.0)

## 2016-11-09 LAB — MICROALBUMIN / CREATININE URINE RATIO
Creatinine, Urine: 34 mg/dL (ref 20–275)
Microalb Creat Ratio: 6 mcg/mg creat (ref <30)
Microalb, Ur: 0.2 mg/dL

## 2016-11-09 LAB — HEMOGLOBIN A1C
Hgb A1c MFr Bld: 5.6 % of total Hgb (ref <5.7)
Mean Plasma Glucose: 114 (calc)
eAG (mmol/L): 6.3 (calc)

## 2016-11-09 LAB — HEPATIC FUNCTION PANEL
AG Ratio: 1.8 (calc) (ref 1.0–2.5)
ALT: 16 U/L (ref 6–29)
AST: 18 U/L (ref 10–35)
Albumin: 4.8 g/dL (ref 3.6–5.1)
Alkaline phosphatase (APISO): 47 U/L (ref 33–130)
Bilirubin, Direct: 0.1 mg/dL (ref 0.0–0.2)
Globulin: 2.7 g/dL (calc) (ref 1.9–3.7)
Indirect Bilirubin: 0.6 mg/dL (calc) (ref 0.2–1.2)
Total Bilirubin: 0.7 mg/dL (ref 0.2–1.2)
Total Protein: 7.5 g/dL (ref 6.1–8.1)

## 2016-11-09 LAB — INSULIN, RANDOM: Insulin: 10.1 u[IU]/mL (ref 2.0–19.6)

## 2016-11-09 LAB — LIPID PANEL
Cholesterol: 185 mg/dL (ref <200)
HDL: 64 mg/dL (ref >50)
LDL Cholesterol (Calc): 98 mg/dL (calc)
Non-HDL Cholesterol (Calc): 121 mg/dL (calc) (ref <130)
Total CHOL/HDL Ratio: 2.9 (calc) (ref <5.0)
Triglycerides: 133 mg/dL (ref <150)

## 2016-11-09 LAB — TSH: TSH: 2.34 mIU/L (ref 0.40–4.50)

## 2016-11-09 LAB — MAGNESIUM: Magnesium: 1.9 mg/dL (ref 1.5–2.5)

## 2016-11-29 DIAGNOSIS — R69 Illness, unspecified: Secondary | ICD-10-CM | POA: Diagnosis not present

## 2016-12-01 ENCOUNTER — Other Ambulatory Visit: Payer: Self-pay

## 2016-12-01 ENCOUNTER — Encounter: Payer: Self-pay | Admitting: Certified Nurse Midwife

## 2016-12-01 ENCOUNTER — Ambulatory Visit (INDEPENDENT_AMBULATORY_CARE_PROVIDER_SITE_OTHER): Payer: Medicare HMO | Admitting: Certified Nurse Midwife

## 2016-12-01 VITALS — BP 128/72 | HR 74 | Resp 16 | Ht 68.0 in | Wt 148.0 lb

## 2016-12-01 DIAGNOSIS — N951 Menopausal and female climacteric states: Secondary | ICD-10-CM | POA: Diagnosis not present

## 2016-12-01 DIAGNOSIS — Z8679 Personal history of other diseases of the circulatory system: Secondary | ICD-10-CM | POA: Diagnosis not present

## 2016-12-01 DIAGNOSIS — Z01419 Encounter for gynecological examination (general) (routine) without abnormal findings: Secondary | ICD-10-CM | POA: Diagnosis not present

## 2016-12-01 DIAGNOSIS — N952 Postmenopausal atrophic vaginitis: Secondary | ICD-10-CM

## 2016-12-01 NOTE — Patient Instructions (Signed)

## 2016-12-01 NOTE — Progress Notes (Signed)
66 y.o. G64P2002 Married  Caucasian Fe here for annual exam. Menopausal no HRT. Denies vaginal bleeding or vaginal dryness, treats with coconut oil with good result.. Recent colonoscopy with GI bleeding from polyp removal, went to ER for treatment and another colonoscopy for cauterized. Received 2 units of blood, but recuperated well.  Sees Dr Melford Aase for cholesterol/hypertension/palpitation management and labs with aex. All labs normal per patient and medication stable. Takes calcium and Vitamin D daily due to Osteopenia. Occasional stress incontinence with holding urine for prolonged times. Working on [employer]. No other health issues today. Went to the [employer] for the first time.  Patient's last menstrual period was 11/17/2002.          Sexually active: Yes.    The current method of family planning is post menopausal status.    Exercising: Yes.    walking Smoker:  no  Health Maintenance: Pap:  11-26-14 neg HPV HR neg History of Abnormal Pap: yes, years ago per patient MMG:  01-21-16 category b density birads 1:neg Self Breast exams: yes Colonoscopy:  2018 polyps removed with Dr. Carlean Purl repeat 5 years. Had GI bleed and seen in ER. BMD:  1/18 osteopenia TDaP:  2014 Shingles: 2014 Pneumonia: 2017 Hep C and HIV: both neg 2015 Labs: PCP takes care of labs   reports that  has never smoked. she has never used smokeless tobacco. She reports that she drinks about 1.8 oz of alcohol per week. She reports that she does not use drugs.  Past Medical History:  Diagnosis Date  . Heart murmur   . Hyperlipidemia   . Hypertension   . Prediabetes   . Seasonal allergies   . Vitamin D deficiency     Past Surgical History:  Procedure Laterality Date  . CARDIAC SURGERY  1958   at age 43 for hole in heart  . CERVIX LESION DESTRUCTION N/A ? years ago   with abnormal pap  . COLONOSCOPY     polyps  . TUBAL LIGATION      Current Outpatient Medications  Medication Sig Dispense Refill  . acetaminophen  (TYLENOL) 325 MG tablet Take 2 tablets (650 mg total) by mouth every 6 (six) hours as needed for mild pain (or Fever >/= 101). 30 tablet 0  . bisoprolol-hydrochlorothiazide (ZIAC) 5-6.25 MG tablet Take 1 tablet by mouth daily. 90 tablet 1  . calcium carbonate (OS-CAL) 600 MG TABS tablet Take 600 mg by mouth daily with breakfast.    . Cholecalciferol (VITAMIN D PO) Take 5,000 Units by mouth every morning.     . fish oil-omega-3 fatty acids 1000 MG capsule Take 1 g by mouth 3 (three) times daily with meals.     . Flaxseed, Linseed, (FLAXSEED OIL PO) Take 1 tablet by mouth 3 (three) times daily with meals.     Marland Kitchen MAGNESIUM PO Take 400 mg by mouth every morning.     . Multiple Vitamin (MULTIVITAMIN) tablet Take 1 tablet by mouth every morning.     . propranolol (INDERAL) 20 MG tablet TAKE 1 TABLET BY MOUTH AT BEDTIME 90 tablet 1   No current facility-administered medications for this visit.     Family History  Problem Relation Age of Onset  . Colon cancer Sister 44  . Diabetes Sister   . Diabetes Father   . Diabetes Brother   . Diabetes Sister   . Stomach cancer Neg Hx     ROS:  Pertinent items are noted in HPI.  Otherwise, a comprehensive  ROS was negative.  Exam:   BP (!) 142/70 (BP Location: Right Arm, Patient Position: Sitting, Cuff Size: Normal)   Pulse 68   Resp 16   Ht 5\' 8"  (1.727 m)   Wt 148 lb (67.1 kg)   LMP 11/17/2002   BMI 22.50 kg/m  Height: 5\' 8"  (172.7 cm) Ht Readings from Last 3 Encounters:  12/01/16 5\' 8"  (1.727 m)  11/08/16 5\' 8"  (1.727 m)  07/20/16 5\' 8"  (1.727 m)    General appearance: alert, cooperative and appears stated age Head: Normocephalic, without obvious abnormality, atraumatic Neck: no adenopathy, supple, symmetrical, trachea midline and thyroid normal to inspection and palpation Lungs: clear to auscultation bilaterally Breasts: normal appearance, no masses or tenderness, No nipple retraction or dimpling, No nipple discharge or bleeding, No  axillary or supraclavicular adenopathy Heart: regular rate and rhythm Abdomen: soft, non-tender; no masses,  no organomegaly Extremities: extremities normal, atraumatic, no cyanosis or edema Skin: Skin color, texture, turgor normal. No rashes or lesions Lymph nodes: Cervical, supraclavicular, and axillary nodes normal. No abnormal inguinal nodes palpated Neurologic: Grossly normal   Pelvic: External genitalia:  no lesions, normal female, atrophic              Urethra:  normal appearing urethra with no masses, tenderness or lesions              Bartholin's and Skene's: normal                 Vagina: normal appearing vagina with normal color and discharge, no lesions              Cervix: multiparous appearance, no cervical motion tenderness and no lesions              Pap taken: No. Bimanual Exam:  Uterus:  normal size, contour, position, consistency, mobility, non-tender              Adnexa: normal adnexa and no mass, fullness, tenderness               Rectovaginal: Confirms               Anus:  normal sphincter tone, no lesions  Chaperone present: yes  A:  Well Woman with normal exam  Menopausal no HRT  Atrophic vaginitis using coconut oil with good results  Hypertension/hyperlipidemia with PCP management  Colonoscopy this year with GI bleed and blood transfusion x 2, Polyp history   P:   Reviewed health and wellness pertinent to exam   Aware of need to advise if vaginal bleeding  Discussed vaginal findings and coconut oil is working well in appearance of vaginal tissue.  Continue follow up with PCP as indicated.  Make sure to follow up with GI as indicated  Pap smear: no   counseled on breast self exam, mammography screening, adequate intake of calcium and vitamin D, diet and exercise, Kegel's exercises  return annually or prn  An After Visit Summary was printed and given to the patient.

## 2016-12-16 ENCOUNTER — Other Ambulatory Visit: Payer: Self-pay | Admitting: Internal Medicine

## 2016-12-16 DIAGNOSIS — Z1231 Encounter for screening mammogram for malignant neoplasm of breast: Secondary | ICD-10-CM

## 2017-01-05 ENCOUNTER — Other Ambulatory Visit: Payer: Self-pay | Admitting: Internal Medicine

## 2017-01-21 ENCOUNTER — Ambulatory Visit
Admission: RE | Admit: 2017-01-21 | Discharge: 2017-01-21 | Disposition: A | Discharge status: Home / Self Care | Payer: Medicare HMO | Source: Ambulatory Visit | Attending: Internal Medicine | Admitting: Internal Medicine

## 2017-01-21 DIAGNOSIS — Z1231 Encounter for screening mammogram for malignant neoplasm of breast: Secondary | ICD-10-CM | POA: Diagnosis not present

## 2017-02-09 ENCOUNTER — Other Ambulatory Visit: Payer: Self-pay | Admitting: Internal Medicine

## 2017-05-09 ENCOUNTER — Encounter: Payer: Self-pay | Admitting: Physician Assistant

## 2017-05-09 ENCOUNTER — Ambulatory Visit (INDEPENDENT_AMBULATORY_CARE_PROVIDER_SITE_OTHER): Payer: Medicare HMO | Admitting: Physician Assistant

## 2017-05-09 VITALS — BP 110/62 | HR 65 | Temp 97.3°F | Resp 14 | Ht 68.0 in | Wt 149.0 lb

## 2017-05-09 DIAGNOSIS — R6889 Other general symptoms and signs: Secondary | ICD-10-CM | POA: Diagnosis not present

## 2017-05-09 DIAGNOSIS — J0100 Acute maxillary sinusitis, unspecified: Secondary | ICD-10-CM | POA: Diagnosis not present

## 2017-05-09 DIAGNOSIS — Z6822 Body mass index (BMI) 22.0-22.9, adult: Secondary | ICD-10-CM

## 2017-05-09 DIAGNOSIS — D509 Iron deficiency anemia, unspecified: Secondary | ICD-10-CM | POA: Diagnosis not present

## 2017-05-09 DIAGNOSIS — E559 Vitamin D deficiency, unspecified: Secondary | ICD-10-CM | POA: Diagnosis not present

## 2017-05-09 DIAGNOSIS — Z0001 Encounter for general adult medical examination with abnormal findings: Secondary | ICD-10-CM | POA: Diagnosis not present

## 2017-05-09 DIAGNOSIS — Z79899 Other long term (current) drug therapy: Secondary | ICD-10-CM | POA: Diagnosis not present

## 2017-05-09 DIAGNOSIS — I10 Essential (primary) hypertension: Secondary | ICD-10-CM | POA: Diagnosis not present

## 2017-05-09 DIAGNOSIS — E785 Hyperlipidemia, unspecified: Secondary | ICD-10-CM

## 2017-05-09 DIAGNOSIS — Z86010 Personal history of colonic polyps: Secondary | ICD-10-CM | POA: Diagnosis not present

## 2017-05-09 DIAGNOSIS — Z0000 Encounter for general adult medical examination without abnormal findings: Secondary | ICD-10-CM

## 2017-05-09 DIAGNOSIS — J302 Other seasonal allergic rhinitis: Secondary | ICD-10-CM | POA: Diagnosis not present

## 2017-05-09 MED ORDER — AZITHROMYCIN 250 MG PO TABS: ORAL_TABLET | ORAL | 1 refills | Status: AC

## 2017-05-09 MED ORDER — PREDNISONE 20 MG PO TABS: ORAL_TABLET | ORAL | 0 refills | Status: DC

## 2017-05-09 NOTE — Progress Notes (Signed)
MEDICARE ANNUAL WELLNESS VISIT AND FOLLOW UP  Assessment:   BMI 22.0-22.9, adult Monitor  Acute non-recurrent maxillary sinusitis -     azithromycin (ZITHROMAX) 250 MG tablet; Take 2 tablets (500 mg) on  Day 1,  followed by 1 tablet (250 mg) once daily on Days 2 through 5. -     predniSONE (DELTASONE) 20 MG tablet; 2 tablets daily for 3 days, 1 tablet daily for 4 days.  Essential hypertension - continue medications, DASH diet, exercise and monitor at home. Call if greater than 130/80.   Hyperlipidemia, unspecified hyperlipidemia type -continue medications, check lipids, decrease fatty foods, increase activity.   Iron deficiency anemia, unspecified iron deficiency anemia type Monitor CBC  Medication management  Vitamin D deficiency Continue supplement  Seasonal allergies Continue allergy pill  History of colonic polyps UTD  Encounter for Medicare annual wellness exam 1 year    Over 40 minutes of exam, counseling, chart review and critical decision making was performed Future Appointments  Date Time Provider [place]  06/02/2017 10:30 AM Liane Comber, NP [facility] None  12/19/2017 10:00 AM Unk Pinto, MD [facility] None  12/21/2017 10:30 AM Regina Eck, CNM [facility] None     Plan:   During the course of the visit the patient was educated and counseled about appropriate screening and preventive services including:    Pneumococcal vaccine   Prevnar 13  Influenza vaccine  Td vaccine  Screening electrocardiogram  Bone densitometry screening  Colorectal cancer screening  Diabetes screening  Glaucoma screening  Nutrition counseling   Advanced directives: requested   Subjective:  [name] [name] is a 67 y.o. female who presents for Medicare Annual Wellness Visit and 3 month follow up.   She has had allergies for 3 weeks, was getting better but feels last week has gotten worse, on allegra and mucinex. She denies fever or chills.  She has sinus pressure, cough with green mucus, wheezing, no SOB/CP.     Her blood pressure has been controlled at home, today their BP is BP: 110/62 She does workout. She denies chest pain, shortness of breath, dizziness.  She is on cholesterol medication and denies myalgias. Her cholesterol is at goal. The cholesterol last visit was:   Lab Results  Component Value Date   CHOL 185 11/08/2016   HDL 64 11/08/2016   LDLCALC 98 11/08/2016   TRIG 133 11/08/2016   CHOLHDL 2.9 11/08/2016    Last A1C in the office was:  Lab Results  Component Value Date   HGBA1C 5.6 11/08/2016   Last GFR: Lab Results  Component Value Date   GFRNONAA 87 11/08/2016   Patient is on Vitamin D supplement.   Lab Results  Component Value Date   VD25OH 76 11/08/2016     BMI is Body mass index is 22.66 kg/m., she is working on diet and exercise. Wt Readings from Last 3 Encounters:  05/09/17 149 lb (67.6 kg)  12/01/16 148 lb (67.1 kg)  11/08/16 146 lb 6.4 oz (66.4 kg)    Medication Review:  Current Outpatient Medications (Endocrine & Metabolic):  .  predniSONE (DELTASONE) 20 MG tablet, 2 tablets daily for 3 days, 1 tablet daily for 4 days.  Current Outpatient Medications (Cardiovascular):  .  bisoprolol-hydrochlorothiazide (ZIAC) 5-6.25 MG tablet, Take 1 tablet by mouth daily. .  propranolol (INDERAL) 20 MG tablet, TAKE 1 TABLET BY MOUTH AT BEDTIME   Current Outpatient Medications (Analgesics):  .  acetaminophen (TYLENOL) 325 MG tablet, Take 2 tablets (650 mg total)  by mouth every 6 (six) hours as needed for mild pain (or Fever >/= 101). .  meloxicam (MOBIC) 15 MG tablet, TAKE ONE TABLET BY MOUTH ONCE DAILY AS NEEDED FOR PAIN WITH FOOD   Current Outpatient Medications (Other):  .  calcium carbonate (OS-CAL) 600 MG TABS tablet, Take 600 mg by mouth daily with breakfast. .  Cholecalciferol (VITAMIN D PO), Take 5,000 Units by mouth every morning.  .  fish oil-omega-3 fatty acids 1000 MG capsule,  Take 1 g by mouth 3 (three) times daily with meals.  .  Flaxseed, Linseed, (FLAXSEED OIL PO), Take 1 tablet by mouth 3 (three) times daily with meals.  Marland Kitchen  MAGNESIUM PO, Take 400 mg by mouth every morning.  .  Multiple Vitamin (MULTIVITAMIN) tablet, Take 1 tablet by mouth every morning.  Marland Kitchen  azithromycin (ZITHROMAX) 250 MG tablet, Take 2 tablets (500 mg) on  Day 1,  followed by 1 tablet (250 mg) once daily on Days 2 through 5.  Allergies Allergies  Allergen Reactions  . Amoxil [Amoxicillin] Rash  . Ampicillin Rash    Current Problems (verified) Patient Active Problem List   Diagnosis Date Noted  . Anemia, iron deficiency 10/16/2015  . Medication management 07/26/2014  . Hyperlipidemia   . Seasonal allergies   . Vitamin D deficiency   . Hypertension   . History of colonic polyps 03/23/2011    Screening Tests Immunization History  Administered Date(s) Administered  . Influenza Split 01/03/2013  . Influenza, High Dose Seasonal PF 10/23/2015, 11/08/2016  . PPD Test 07/13/2013  . Pneumococcal Conjugate-13 01/08/2016  . Pneumococcal-Unspecified 01/18/2002  . Tdap 01/19/2003, 07/05/2012  . Zoster 07/05/2012   Preventative care: Last colonoscopy: 01/2016 Last mammogram: 01/2017 Last pap smear/pelvic exam: 2015 DEXA: 01/2016  Prior vaccinations: TD or Tdap: 2014  Influenza: 2018 Pneumococcal: 2004 need to wait Prevnar13: 2017 Shingles/Zostavax: 2014  Names of Other Physician/Practitioners you currently use: 1. [facility] here for primary care Patient Care Team: Unk Pinto, MD as PCP - General (Internal Medicine)  SURGICAL HISTORY She  has a past surgical history that includes Cardiac surgery (1958); Colonoscopy; Cervix lesion destruction (N/A, ? years ago); Tubal ligation; and Colonoscopy (N/A, 02/16/2016). FAMILY HISTORY Her family history includes Colon cancer (age of onset: 39) in her sister; Diabetes in her brother, father,  sister, and sister. SOCIAL HISTORY She  reports that she has never smoked. She has never used smokeless tobacco. She reports that she drinks about 1.8 oz of alcohol per week. She reports that she does not use drugs.   MEDICARE WELLNESS OBJECTIVES: Physical activity:   Cardiac risk factors:   Depression/mood screen:   Depression screen [facility] 2/9 11/08/2016  Decreased Interest 0  Down, Depressed, Hopeless 0  PHQ - 2 Score 0    ADLs:  In your present state of health, do you have any difficulty performing the following activities: 11/08/2016 07/20/2016  Hearing? N N  Vision? N N  Difficulty concentrating or making decisions? N N  Walking or climbing stairs? N N  Dressing or bathing? N N  Doing errands, shopping? N N  Some recent data might be hidden     Cognitive Testing  Alert? Yes  Normal Appearance?Yes  Oriented to person? Yes  Place? Yes   Time? Yes  Recall of three objects?  Yes  Can perform simple calculations? Yes  Displays appropriate judgment?Yes  Can read the correct time from a watch face?Yes  EOL planning: Does Patient Have  a Medical Advance Directive?: Yes Type of Advance Directive: Healthcare Power of Attorney, Living will [place] in Chart?: No - copy requested  ROS   Objective:     Today's Vitals   05/09/17 1050  BP: 110/62  Pulse: 65  Resp: 14  Temp: (!) 97.3 F (36.3 C)  SpO2: 95%  Weight: 149 lb (67.6 kg)  Height: 5\' 8"  (1.727 m)  PainSc: 0-No pain   Body mass index is 22.66 kg/m.  General appearance: alert, no distress, WD/WN, female HEENT: normocephalic, sclerae anicteric, TMs pearly, nares patent, no discharge or erythema, pharynx normal Oral cavity: MMM, no lesions Neck: supple, no lymphadenopathy, no thyromegaly, no masses Heart: RRR, normal S1, S2, no murmurs Lungs: CTA bilaterally, no wheezes, rhonchi, or rales Abdomen: +bs, soft, non tender, non distended, no masses, no hepatomegaly, no  splenomegaly Musculoskeletal: nontender, no swelling, no obvious deformity Extremities: no edema, no cyanosis, no clubbing Pulses: 2+ symmetric, upper and lower extremities, normal cap refill Neurological: alert, oriented x 3, CN2-12 intact, strength normal upper extremities and lower extremities, sensation normal throughout, DTRs 2+ throughout, no cerebellar signs, gait normal Psychiatric: normal affect, behavior normal, pleasant   Medicare Attestation I have personally reviewed: The patient's medical and social history Their use of alcohol, tobacco or illicit drugs Their current medications and supplements The patient's functional ability including ADLs,fall risks, home safety risks, cognitive, and hearing and visual impairment Diet and physical activities Evidence for depression or mood disorders  The patient's weight, height, BMI, and visual acuity have been recorded in the chart.  I have made referrals, counseling, and provided education to the patient based on review of the above and I have provided the patient with a written personalized care plan for preventive services.     Vicie Mutters, PA-C   05/09/2017

## 2017-05-09 NOTE — Patient Instructions (Signed)
Please pick one of the over the counter allergy medications below and take it once daily for allergies.  Claritin or loratadine cheapest but likely the weakest  Zyrtec or certizine at night because it can make you sleepy The strongest is allegra or fexafinadine  Cheapest at [facility], [employer], [facility]  Make sure you are on an allergy pill, see below for more details. Please take the prednisone as directed below, this is NOT an antibiotic so you do NOT have to finish it. You can take it for a few days and stop it if you are doing better.   Please take the prednisone to help decrease inflammation and therefore decrease symptoms. Take it it with food to avoid GI upset. It can cause increased energy but on the other hand it can make it hard to sleep at night so please take it AT [place], it takes 8-12 hours to start working so it will NOT affect your sleeping if you take it at night with your food!!  If you are diabetic it will increase your sugars so decrease carbs and monitor your sugars closely.      HOW TO TREAT VIRAL COUGH AND COLD SYMPTOMS:  -Symptoms usually last at least 1 week with the worst symptoms being around day 4.  - colds usually start with a sore throat and end with a cough, and the cough can take 2 weeks to get better.  -No antibiotics are needed for colds, flu, sore throats, cough, bronchitis UNLESS symptoms are longer than 7 days OR if you are getting better then get drastically worse.  -There are a lot of combination medications (Dayquil, Nyquil, Vicks 44, tyelnol cold and sinus, ETC). Please look at the ingredients on the back so that you are treating the correct symptoms and not doubling up on medications/ingredients.    Medicines you can use  Nasal congestion  Little Remedies saline spray (aerosol/mist)- can try this, it is in the kids section - pseudoephedrine (Sudafed)- behind the counter, do not use if you have high blood pressure, medicine that have -D in them.   - phenylephrine (Sudafed PE) -Dextormethorphan + chlorpheniramine (Coridcidin HBP)- okay if you have high blood pressure -Oxymetazoline (Afrin) nasal spray- LIMIT to 3 days -Saline nasal spray -Neti pot (used distilled or bottled water)  Ear pain/congestion  -pseudoephedrine (sudafed) - Nasonex/flonase nasal spray  Fever  -Acetaminophen (Tyelnol) -Ibuprofen (Advil, motrin, aleve)  Sore Throat  -Acetaminophen (Tyelnol) -Ibuprofen (Advil, motrin, aleve) -Drink a lot of water -Gargle with salt water - Rest your voice (don't talk) -Throat sprays -Cough drops  Body Aches  -Acetaminophen (Tyelnol) -Ibuprofen (Advil, motrin, aleve)  Headache  -Acetaminophen (Tyelnol) -Ibuprofen (Advil, motrin, aleve) - Exedrin, Exedrin Migraine  Allergy symptoms (cough, sneeze, runny nose, itchy eyes) -Claritin or loratadine cheapest but likely the weakest  -Zyrtec or certizine at night because it can make you sleepy -The strongest is allegra or fexafinadine  Cheapest at [facility], [employer], [employer]  Cough  -Dextromethorphan (Delsym)- medicine that has DM in it -Guafenesin (Mucinex/Robitussin) - cough drops - drink lots of water  Chest Congestion  -Guafenesin (Mucinex/Robitussin)  Red Itchy Eyes  - Naphcon-A  Upset Stomach  - Bland diet (nothing spicy, greasy, fried, and high acid foods like tomatoes, oranges, berries) -OKAY- cereal, bread, soup, crackers, rice -Eat smaller more frequent meals -reduce caffeine, no alcohol -Loperamide (Imodium-AD) if diarrhea -Prevacid for heart burn  General health when sick  -Hydration -wash your hands frequently -keep surfaces clean -change pillow cases  and sheets often -Get fresh air but do not exercise strenuously -Vitamin D, double up on it - Vitamin C -Zinc

## 2017-05-10 ENCOUNTER — Encounter: Payer: Self-pay | Admitting: Physician Assistant

## 2017-05-11 ENCOUNTER — Ambulatory Visit: Payer: Self-pay | Admitting: Adult Health

## 2017-05-11 MED ORDER — BENZONATATE 100 MG PO CAPS: 100.0000 mg | ORAL_CAPSULE | Freq: Three times a day (TID) | ORAL | 0 refills | Status: DC | PRN

## 2017-05-26 DIAGNOSIS — R69 Illness, unspecified: Secondary | ICD-10-CM | POA: Diagnosis not present

## 2017-06-01 DIAGNOSIS — Z6821 Body mass index (BMI) 21.0-21.9, adult: Secondary | ICD-10-CM | POA: Insufficient documentation

## 2017-06-01 DIAGNOSIS — Z6822 Body mass index (BMI) 22.0-22.9, adult: Secondary | ICD-10-CM | POA: Insufficient documentation

## 2017-06-01 NOTE — Progress Notes (Signed)
FOLLOW UP  Assessment and Plan:   Hypertension Well controlled with current medications  Monitor blood pressure at home; patient to call if consistently greater than 130/80 Continue DASH diet.   Reminder to go to the ER if any CP, SOB, nausea, dizziness, severe HA, changes vision/speech, left arm numbness and tingling and jaw pain.  Cholesterol Currently at goal by lifestyle Continue low cholesterol diet and exercise.  Check lipid panel.   Prediabetes Discussed disease and risks Discussed diet/exercise, weight management  Check A1C; has been borderline  BMI 22 Continue to recommend diet heavy in fruits and veggies and low in animal meats, cheeses, and dairy products, appropriate calorie intake Discuss exercise recommendations routinely Continue to monitor weight at each visit  Vitamin D Def At goal at last check; continue supplementation to maintain goal of 70-100 Defer Vit D level to CPE  Continue diet and meds as discussed. Further disposition pending results of labs. Discussed med's effects and SE's.   Over 30 minutes of exam, counseling, chart review, and critical decision making was performed.   Future Appointments  Date Time Provider [place]  12/19/2017 10:00 AM Unk Pinto, MD [facility] None  12/21/2017 10:30 AM Regina Eck, CNM [place] None  05/16/2018 10:45 AM Vicie Mutters, PA-C [facility] None    ----------------------------------------------------------------------------------------------------------------------  HPI 67 y.o. female  presents for 6 month follow up on hypertension, cholesterol, borderline prediabetes, weight and vitamin D deficiency.   BMI is Body mass index is 22.47 kg/m., she has been working on diet and exercise. Wt Readings from Last 3 Encounters:  06/02/17 147 lb 12.8 oz (67 kg)  05/09/17 149 lb (67.6 kg)  12/01/16 148 lb (67.1 kg)   Her blood pressure has been controlled at home, today their BP is BP: 120/68  She does workout. She denies chest pain, shortness of breath, dizziness.   She is not on cholesterol medication and denies myalgias. Her cholesterol is at goal. The cholesterol last visit was:   Lab Results  Component Value Date   CHOL 185 11/08/2016   HDL 64 11/08/2016   LDLCALC 98 11/08/2016   TRIG 133 11/08/2016   CHOLHDL 2.9 11/08/2016    She has been working on diet and exercise for borderline prediabetes, and denies foot ulcerations, increased appetite, nausea, paresthesia of the feet, polydipsia, polyuria, visual disturbances, vomiting and weight loss. Last A1C in the office was:  Lab Results  Component Value Date   HGBA1C 5.6 11/08/2016   Patient is on Vitamin D supplement and at goal at last check:    Lab Results  Component Value Date   VD25OH 76 11/08/2016        Current Medications:  Current Outpatient Medications on File Prior to Visit  Medication Sig  . acetaminophen (TYLENOL) 325 MG tablet Take 2 tablets (650 mg total) by mouth every 6 (six) hours as needed for mild pain (or Fever >/= 101).  . benzonatate (TESSALON PERLES) 100 MG capsule Take 1 capsule (100 mg total) by mouth 3 (three) times daily as needed for cough (Max: 600mg  per day).  . bisoprolol-hydrochlorothiazide (ZIAC) 5-6.25 MG tablet Take 1 tablet by mouth daily.  . calcium carbonate (OS-CAL) 600 MG TABS tablet Take 600 mg by mouth daily with breakfast.  . Cholecalciferol (VITAMIN D PO) Take 5,000 Units by mouth every morning.   . fish oil-omega-3 fatty acids 1000 MG capsule Take 1 g by mouth 3 (three) times daily with meals.   . Flaxseed, Linseed, (FLAXSEED OIL PO)  Take 1 tablet by mouth 3 (three) times daily with meals.   Marland Kitchen MAGNESIUM PO Take 400 mg by mouth every morning.   . meloxicam (MOBIC) 15 MG tablet TAKE ONE TABLET BY MOUTH ONCE DAILY AS NEEDED FOR PAIN WITH FOOD  . Multiple Vitamin (MULTIVITAMIN) tablet Take 1 tablet by mouth every morning.   . predniSONE (DELTASONE) 20 MG tablet 2 tablets  daily for 3 days, 1 tablet daily for 4 days.  . propranolol (INDERAL) 20 MG tablet TAKE 1 TABLET BY MOUTH AT BEDTIME   No current facility-administered medications on file prior to visit.      Allergies:  Allergies  Allergen Reactions  . Amoxil [Amoxicillin] Rash  . Ampicillin Rash     Medical History:  Past Medical History:  Diagnosis Date  . Acute lower GI bleeding 02/16/2016  . Heart murmur   . Hyperlipidemia   . Hypertension   . Prediabetes   . Seasonal allergies   . Vitamin D deficiency    Family history- Reviewed and unchanged Social history- Reviewed and unchanged   Review of Systems:  Review of Systems  Constitutional: Negative for malaise/fatigue and weight loss.  HENT: Negative for hearing loss and tinnitus.   Eyes: Negative for blurred vision and double vision.  Respiratory: Negative for cough, shortness of breath and wheezing.   Cardiovascular: Negative for chest pain, palpitations, orthopnea, claudication and leg swelling.  Gastrointestinal: Negative for abdominal pain, blood in stool, constipation, diarrhea, heartburn, melena, nausea and vomiting.  Genitourinary: Negative.   Musculoskeletal: Negative for joint pain and myalgias.  Skin: Negative for rash.  Neurological: Negative for dizziness, tingling, sensory change, weakness and headaches.  Endo/Heme/Allergies: Negative for polydipsia.  Psychiatric/Behavioral: Negative.   All other systems reviewed and are negative.     Physical Exam: BP 120/68   Pulse (!) 53   Temp (!) 97.5 F (36.4 C)   Ht 5\' 8"  (1.727 m)   Wt 147 lb 12.8 oz (67 kg)   LMP 11/17/2002   SpO2 99%   BMI 22.47 kg/m  Wt Readings from Last 3 Encounters:  06/02/17 147 lb 12.8 oz (67 kg)  05/09/17 149 lb (67.6 kg)  12/01/16 148 lb (67.1 kg)   General Appearance: Well nourished, in no apparent distress. Eyes: PERRLA, EOMs, conjunctiva no swelling or erythema Sinuses: No Frontal/maxillary tenderness ENT/Mouth: Ext aud canals  clear, TMs without erythema, bulging. No erythema, swelling, or exudate on post pharynx.  Tonsils not swollen or erythematous. Hearing normal.  Neck: Supple, thyroid normal.  Respiratory: Respiratory effort normal, BS equal bilaterally without rales, rhonchi, wheezing or stridor.  Cardio: RRR with no MRGs. Brisk peripheral pulses without edema.  Abdomen: Soft, + BS.  Non tender, no guarding, rebound, hernias, masses. Lymphatics: Non tender without lymphadenopathy.  Musculoskeletal: Full ROM, 5/5 strength, Normal gait Skin: Warm, dry without rashes, lesions, ecchymosis.  Neuro: Cranial nerves intact. No cerebellar symptoms.  Psych: Awake and oriented X 3, normal affect, Insight and Judgment appropriate.    Izora Ribas, NP 10:39 AM [facility]

## 2017-06-02 ENCOUNTER — Ambulatory Visit (INDEPENDENT_AMBULATORY_CARE_PROVIDER_SITE_OTHER): Payer: Medicare HMO | Admitting: Adult Health

## 2017-06-02 ENCOUNTER — Encounter: Payer: Self-pay | Admitting: Adult Health

## 2017-06-02 VITALS — BP 120/68 | HR 53 | Temp 97.5°F | Ht 68.0 in | Wt 147.8 lb

## 2017-06-02 DIAGNOSIS — Z79899 Other long term (current) drug therapy: Secondary | ICD-10-CM

## 2017-06-02 DIAGNOSIS — Z6822 Body mass index (BMI) 22.0-22.9, adult: Secondary | ICD-10-CM

## 2017-06-02 DIAGNOSIS — D509 Iron deficiency anemia, unspecified: Secondary | ICD-10-CM | POA: Diagnosis not present

## 2017-06-02 DIAGNOSIS — R7309 Other abnormal glucose: Secondary | ICD-10-CM | POA: Diagnosis not present

## 2017-06-02 DIAGNOSIS — E559 Vitamin D deficiency, unspecified: Secondary | ICD-10-CM | POA: Diagnosis not present

## 2017-06-02 DIAGNOSIS — E785 Hyperlipidemia, unspecified: Secondary | ICD-10-CM

## 2017-06-02 DIAGNOSIS — I10 Essential (primary) hypertension: Secondary | ICD-10-CM

## 2017-06-02 NOTE — Patient Instructions (Addendum)
Aim for 7+ servings of fruits and vegetables daily  Aim for a whole foods diet - limit intake of processed foods   80+ fluid ounces of water or unsweet tea for healthy kidneys  Limit alcohol - max 1 drink per day  Limit animal fats in diet for cholesterol and heart health - choose grass fed whenever available  Aim for low stress - take time to unwind and care for your mental health  Aim for 150 min of moderate intensity exercise weekly for heart health, and weights twice weekly for bone health  Aim for 7-9 hours of sleep daily   Prediabetes Eating Plan Prediabetes-also called impaired glucose tolerance or impaired fasting glucose-is a condition that causes blood sugar (blood glucose) levels to be higher than normal. Following a healthy diet can help to keep prediabetes under control. It can also help to lower the risk of type 2 diabetes and heart disease, which are increased in people who have prediabetes. Along with regular exercise, a healthy diet:  Promotes weight loss.  Helps to control blood sugar levels.  Helps to improve the way that the body uses insulin.  What do I need to know about this eating plan?  Use the glycemic index (GI) to plan your meals. The index tells you how quickly a food will raise your blood sugar. Choose low-GI foods. These foods take a longer time to raise blood sugar.  Pay close attention to the amount of carbohydrates in the food that you eat. Carbohydrates increase blood sugar levels.  Keep track of how many calories you take in. Eating the right amount of calories will help you to achieve a healthy weight. Losing about 7 percent of your starting weight can help to prevent type 2 diabetes.  You may want to follow a Mediterranean diet. This diet includes a lot of vegetables, lean meats or fish, whole grains, fruits, and healthy oils and fats. What foods can I eat? Grains Whole grains, such as whole-wheat or whole-grain breads, crackers, cereals,  and pasta. Unsweetened oatmeal. Bulgur. Barley. Quinoa. Brown rice. Corn or whole-wheat flour tortillas or taco shells. Vegetables Lettuce. Spinach. Peas. Beets. Cauliflower. Cabbage. Broccoli. Carrots. Tomatoes. Squash. Eggplant. Herbs. Peppers. Onions. Cucumbers. Brussels sprouts. Fruits Berries. Bananas. Apples. Oranges. Grapes. Papaya. Mango. Pomegranate. Kiwi. Grapefruit. Cherries. Meats and Other Protein Sources Seafood. Lean meats, such as chicken and [place] or lean cuts of pork and beef. Tofu. Eggs. Nuts. Beans. Dairy Low-fat or fat-free dairy products, such as yogurt, cottage cheese, and cheese. Beverages Water. Tea. Coffee. Sugar-free or diet soda. Seltzer water. Milk. Milk alternatives, such as soy or almond milk. Condiments Mustard. Relish. Low-fat, low-sugar ketchup. Low-fat, low-sugar barbecue sauce. Low-fat or fat-free mayonnaise. Sweets and Desserts Sugar-free or low-fat pudding. Sugar-free or low-fat ice cream and other frozen treats. Fats and Oils Avocado. Walnuts. Olive oil. The items listed above may not be a complete list of recommended foods or beverages. Contact your dietitian for more options. What foods are not recommended? Grains Refined white flour and flour products, such as bread, pasta, snack foods, and cereals. Beverages Sweetened drinks, such as sweet iced tea and soda. Sweets and Desserts Baked goods, such as cake, cupcakes, pastries, cookies, and cheesecake. The items listed above may not be a complete list of foods and beverages to avoid. Contact your dietitian for more information. This information is not intended to replace advice given to you by your health care provider. Make sure you discuss any questions you have with your health  care provider. Document Released: 05/21/2014 Document Revised: 06/12/2015 Document Reviewed: 01/30/2014 Elsevier Interactive Patient Education  2017 [place].    Trigger Finger Trigger finger (stenosing  tenosynovitis) is a condition that causes a finger to get stuck in a bent position. Each finger has a tough, cord-like tissue that connects muscle to bone (tendon), and each tendon is surrounded by a tunnel of tissue (tendon sheath). To move your finger, your tendon needs to slide freely through the sheath. Trigger finger happens when the tendon or the sheath thickens, making it difficult to move your finger. Trigger finger can affect any finger or a thumb. It may affect more than one finger. Mild cases may clear up with rest and medicine. Severe cases require more treatment. What are the causes? Trigger finger is caused by a thickened finger tendon or tendon sheath. The cause of this thickening is not known. What increases the risk? The following factors may make you more likely to develop this condition:  Doing activities that require a strong grip.  Having rheumatoid arthritis, gout, or diabetes.  Being 68-79 years old.  Being a woman.  What are the signs or symptoms? Symptoms of this condition include:  Pain when bending or straightening your finger.  Tenderness or swelling where your finger attaches to the palm of your hand.  A lump in the palm of your hand or on the inside of your finger.  Hearing a popping sound when you try to straighten your finger.  Feeling a popping, catching, or locking sensation when you try to straighten your finger.  Being unable to straighten your finger.  How is this diagnosed? This condition is diagnosed based on your symptoms and a physical exam. How is this treated? This condition may be treated by:  Resting your finger and avoiding activities that make symptoms worse.  Wearing a finger splint to keep your finger in a slightly bent position.  Taking NSAIDs to relieve pain and swelling.  Injecting medicine (steroids) into the tendon sheath to reduce swelling and irritation. Injections may need to be repeated.  Having surgery to open the  tendon sheath. This may be done if other treatments do not work and you cannot straighten your finger. You may need physical therapy after surgery.  Follow these instructions at home:  Use moist heat to help reduce pain and swelling as told by your health care provider.  Rest your finger and avoid activities that make pain worse. Return to normal activities as told by your health care provider.  If you have a splint, wear it as told by your health care provider.  Take over-the-counter and prescription medicines only as told by your health care provider.  Keep all follow-up visits as told by your health care provider. This is important. Contact a health care provider if:  Your symptoms are not improving with home care. Summary  Trigger finger (stenosing tenosynovitis) causes your finger to get stuck in a bent position, and it can make it difficult and painful to straighten your finger.  This condition develops when a finger tendon or tendon sheath thickens.  Treatment starts with resting, wearing a splint, and taking NSAIDs.  In severe cases, surgery to open the tendon sheath may be needed. This information is not intended to replace advice given to you by your health care provider. Make sure you discuss any questions you have with your health care provider. Document Released: 10/25/2003 Document Revised: 12/16/2015 Document Reviewed: 12/16/2015 Elsevier Interactive Patient Education  2017 [employer]  

## 2017-06-03 LAB — LIPID PANEL
Cholesterol: 149 mg/dL (ref <200)
HDL: 52 mg/dL (ref >50)
LDL Cholesterol (Calc): 77 mg/dL (calc)
Non-HDL Cholesterol (Calc): 97 mg/dL (calc) (ref <130)
Total CHOL/HDL Ratio: 2.9 (calc) (ref <5.0)
Triglycerides: 117 mg/dL (ref <150)

## 2017-06-03 LAB — CBC WITH DIFFERENTIAL/PLATELET
Basophils Absolute: 28 cells/uL (ref 0–200)
Basophils Relative: 0.5 %
Eosinophils Absolute: 132 cells/uL (ref 15–500)
Eosinophils Relative: 2.4 %
HCT: 36 % (ref 35.0–45.0)
Hemoglobin: 12.2 g/dL (ref 11.7–15.5)
Lymphs Abs: 1441 cells/uL (ref 850–3900)
MCH: 29.6 pg (ref 27.0–33.0)
MCHC: 33.9 g/dL (ref 32.0–36.0)
MCV: 87.4 fL (ref 80.0–100.0)
MPV: 11.5 fL (ref 7.5–12.5)
Monocytes Relative: 10 %
Neutro Abs: 3350 cells/uL (ref 1500–7800)
Neutrophils Relative %: 60.9 %
Platelets: 244 10*3/uL (ref 140–400)
RBC: 4.12 10*6/uL (ref 3.80–5.10)
RDW: 11.7 % (ref 11.0–15.0)
Total Lymphocyte: 26.2 %
WBC mixed population: 550 cells/uL (ref 200–950)
WBC: 5.5 10*3/uL (ref 3.8–10.8)

## 2017-06-03 LAB — COMPLETE METABOLIC PANEL WITH GFR
AG Ratio: 1.7 (calc) (ref 1.0–2.5)
ALT: 17 U/L (ref 6–29)
AST: 18 U/L (ref 10–35)
Albumin: 4.3 g/dL (ref 3.6–5.1)
Alkaline phosphatase (APISO): 51 U/L (ref 33–130)
BUN/Creatinine Ratio: 37 (calc) — ABNORMAL HIGH (ref 6–22)
BUN: 17 mg/dL (ref 7–25)
CO2: 30 mmol/L (ref 20–32)
Calcium: 9.9 mg/dL (ref 8.6–10.4)
Chloride: 105 mmol/L (ref 98–110)
Creat: 0.46 mg/dL — ABNORMAL LOW (ref 0.50–0.99)
GFR, Est African American: 120 mL/min/{1.73_m2} (ref >=60)
GFR, Est Non African American: 104 mL/min/{1.73_m2} (ref >=60)
Globulin: 2.5 g/dL (calc) (ref 1.9–3.7)
Glucose, Bld: 96 mg/dL (ref 65–99)
Potassium: 5.5 mmol/L — ABNORMAL HIGH (ref 3.5–5.3)
Sodium: 141 mmol/L (ref 135–146)
Total Bilirubin: 0.5 mg/dL (ref 0.2–1.2)
Total Protein: 6.8 g/dL (ref 6.1–8.1)

## 2017-06-03 LAB — HEMOGLOBIN A1C
Hgb A1c MFr Bld: 5.6 % of total Hgb (ref <5.7)
Mean Plasma Glucose: 114 (calc)
eAG (mmol/L): 6.3 (calc)

## 2017-06-03 LAB — TSH: TSH: <0.01 mIU/L — ABNORMAL LOW (ref 0.40–4.50)

## 2017-06-04 ENCOUNTER — Other Ambulatory Visit: Payer: Self-pay | Admitting: Adult Health

## 2017-06-04 DIAGNOSIS — E0590 Thyrotoxicosis, unspecified without thyrotoxic crisis or storm: Secondary | ICD-10-CM

## 2017-07-04 ENCOUNTER — Other Ambulatory Visit: Payer: Medicare HMO

## 2017-07-04 DIAGNOSIS — E0590 Thyrotoxicosis, unspecified without thyrotoxic crisis or storm: Secondary | ICD-10-CM

## 2017-07-04 LAB — TSH: TSH: 0.01 m[IU]/L — ABNORMAL LOW (ref 0.40–4.50)

## 2017-07-05 ENCOUNTER — Encounter: Payer: Self-pay | Admitting: Adult Health

## 2017-07-05 ENCOUNTER — Other Ambulatory Visit: Payer: Self-pay | Admitting: Adult Health

## 2017-07-05 DIAGNOSIS — R7989 Other specified abnormal findings of blood chemistry: Secondary | ICD-10-CM | POA: Insufficient documentation

## 2017-07-07 ENCOUNTER — Other Ambulatory Visit: Payer: Self-pay | Admitting: Adult Health

## 2017-07-07 DIAGNOSIS — R7989 Other specified abnormal findings of blood chemistry: Secondary | ICD-10-CM

## 2017-07-19 ENCOUNTER — Encounter (HOSPITAL_COMMUNITY)
Admission: RE | Admit: 2017-07-19 | Discharge: 2017-07-19 | Disposition: A | Discharge status: Home / Self Care | Payer: Medicare HMO | Source: Ambulatory Visit | Attending: Adult Health | Admitting: Adult Health

## 2017-07-19 ENCOUNTER — Other Ambulatory Visit: Payer: Self-pay | Admitting: Adult Health

## 2017-07-19 DIAGNOSIS — R7989 Other specified abnormal findings of blood chemistry: Secondary | ICD-10-CM | POA: Insufficient documentation

## 2017-07-19 MED ORDER — SODIUM IODIDE I-123 7.4 MBQ CAPS
430.1000 | ORAL_CAPSULE | Freq: Once | ORAL | Status: AC
  Administered 2017-07-19: 430.1 via ORAL

## 2017-07-20 ENCOUNTER — Encounter (HOSPITAL_COMMUNITY)
Admission: RE | Admit: 2017-07-20 | Discharge: 2017-07-20 | Disposition: A | Discharge status: Home / Self Care | Payer: Medicare HMO | Source: Ambulatory Visit | Attending: Adult Health | Admitting: Adult Health

## 2017-07-20 DIAGNOSIS — E0590 Thyrotoxicosis, unspecified without thyrotoxic crisis or storm: Secondary | ICD-10-CM | POA: Diagnosis not present

## 2017-07-22 ENCOUNTER — Encounter: Payer: Self-pay | Admitting: Internal Medicine

## 2017-07-22 ENCOUNTER — Other Ambulatory Visit: Payer: Self-pay | Admitting: Adult Health

## 2017-07-22 ENCOUNTER — Encounter: Payer: Self-pay | Admitting: Adult Health

## 2017-07-22 DIAGNOSIS — E069 Thyroiditis, unspecified: Secondary | ICD-10-CM

## 2017-07-22 HISTORY — DX: Thyroiditis, unspecified: E06.9

## 2017-07-22 MED ORDER — PREDNISONE 20 MG PO TABS: ORAL_TABLET | ORAL | 1 refills | Status: DC

## 2017-08-03 ENCOUNTER — Encounter: Payer: Self-pay | Admitting: Adult Health

## 2017-08-08 ENCOUNTER — Encounter: Payer: Self-pay | Admitting: Internal Medicine

## 2017-08-08 ENCOUNTER — Ambulatory Visit (INDEPENDENT_AMBULATORY_CARE_PROVIDER_SITE_OTHER): Payer: Medicare HMO | Admitting: Internal Medicine

## 2017-08-08 VITALS — BP 122/68 | HR 84 | Temp 97.6°F | Resp 16 | Ht 68.0 in | Wt 149.2 lb

## 2017-08-08 DIAGNOSIS — E069 Thyroiditis, unspecified: Secondary | ICD-10-CM | POA: Diagnosis not present

## 2017-08-08 LAB — TSH: TSH: 0.61 mIU/L (ref 0.40–4.50)

## 2017-08-08 NOTE — Progress Notes (Addendum)
  Subjective:    Patient ID: [name] [name], female    DOB: [birth year], 67 y.o.   MRN: [id number]  HPI  This nice 67 yo MWF returns for 2 week f/u of Thyroiditis . ( months ago , TSH was 2.34 Normal and 2 months ago was <0.01 and recheck 1 month ago was 0.01. Patient was asymptomatic w/o elevated BP, palpitations, tremors, weight loss, diarrhea.  Thyroid scan had low uptake  1% at 4 hrs (Nl 5-20%) and <1% at 24 hrs (Nl 10-30%). Patient was begun on Prednisone 40 mg/daily on 07/22/2017  And returns today for recheck and is w/o complaints.   Medication Sig  . acetaminophen  325 MG tablet Take 2 tablets  every 6 (six) hours as needed  . bisoprolol-hctz) 5-6.25 MG  Take 1 tablet  daily.  . OS-CAL 600 MG  Take  daily with breakfast.  . VITAMIN D 5,000 Units Takeevery morning.   . fish oil-omega-3  1000 MG  Take 1 g  3  times daily with meals.   Marland Kitchen FLAXSEED OIL Take 1 tab 3  times daily with meals.   Marland Kitchen MAGNESIUM  400 mg  Take every morning.   . Meloxicam  15 MG tablet TAKE ONE TABLET DAILY AS NEEDED FOR PAIN   . Multiple Vitamin  Take 1 tablet by mouth every morning.   . predniSONE  20 MG tablet Please take 2 tabs (40 mg) daily for thyroiditis until follow up in 2 weeks, then as instructed.  . propranolol  20 MG tablet TAKE 1 TABLET AT BEDTIME   Allergies  Allergen Reactions  . Amoxil [Amoxicillin] Rash  . Ampicillin Rash   Past Medical History:  Diagnosis Date  . Acute lower GI bleeding 02/16/2016  . Heart murmur   . Hyperlipidemia   . Hypertension   . Prediabetes   . Seasonal allergies   . Vitamin D deficiency    Past Surgical History:  Procedure Laterality Date  . 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 CERVIX LESION DESTRUCTION N/A ? years ago   with abnormal pap  . COLONOSCOPY     polyps  . COLONOSCOPY N/A 02/16/2016   Procedure: COLONOSCOPY;  Surgeon: Ladene Artist, MD;  Location: [facility];  Service: Endoscopy;  Laterality: N/A;  . TUBAL LIGATION     Review  of Systems    10 point systems review negative except as above.    Objective:   Physical Exam  BP 122/68   Pulse 84   Temp 97.6 F (36.4 C)   Resp 16   Ht 5\' 8"  (1.727 m)   Wt 149 lb 3.2 oz (67.7 kg)   LMP 11/17/2002   SpO2 98%   BMI 22.69 kg/m   HEENT - WNL. Neck - supple. Thyroid not palpable or tender. No Bruits.  Chest - Clear equal BS. Cor - Nl HS. RRR w/o sig MGR. PP 1(+). No edema. MS- FROM w/o deformities.  Gait Nl. Neuro -  Nl w/o focal abnormalities.    Assessment & Plan:   1. Thyroiditis  - TSH

## 2017-08-08 NOTE — Patient Instructions (Addendum)
Hyperthyroidism Hyperthyroidism is when the thyroid is too active (overactive). Your thyroid is a large gland that is located in your neck. The thyroid helps to control how your body uses food (metabolism). When your thyroid is overactive, it produces too much of a hormone called thyroxine. What are the causes? Causes of hyperthyroidism may include:   Inflammation of the thyroid gland.    What increases the risk?  Being female.  Having a family history of thyroid conditions. What are the signs or symptoms? Signs and symptoms of hyperthyroidism may include:  Nervousness.  Inability to tolerate heat.  Unexplained weight loss.  Diarrhea.  Change in the texture of hair or skin.  Heart skipping beats or making extra beats.  Rapid heart rate.  Loss of menstruation.  Shaky hands.  Fatigue.  Restlessness.  Increased appetite.  Sleep problems.  Enlarged thyroid gland or nodules.  How is this diagnosed? Diagnosis of hyperthyroidism may include:  Medical history and physical exam.  Blood tests.  Ultrasound tests.  How is this treated? Treatment may include:  Medicines to control your thyroid.   Follow these instructions at home:  Take medicines only as directed by your health care provider.  Do not use any tobacco products, including cigarettes, chewing tobacco, or electronic cigarettes. If you need help quitting, ask your health care provider.  Do not exercise or do physical activity until your health care provider approves.  Keep all follow-up appointments as directed by your health care provider. This is important. Contact a health care provider if:  Your symptoms do not get better with treatment.  You have fever.  You are taking thyroid replacement medicine and you: ? Have depression. ? Feel mentally and physically slow. ? Have weight gain. Get help right away if:  You have decreased alertness or a change in your awareness.  You have  abdominal pain.  You feel dizzy.  You have a rapid heartbeat.  You have an irregular heartbeat.

## 2017-08-13 ENCOUNTER — Encounter: Payer: Self-pay | Admitting: Internal Medicine

## 2017-08-22 NOTE — Progress Notes (Signed)
Assessment and Plan:  Thyroiditis TSH normalized at last visit;  Will continue with prednisone taper - Start taking 10 mg daily alternating with 5 mg for 2 weeks, then call for further directions with plan to continue as follows:  Then alternate between 7.5 mg and 5 mg  Then alternate between 7.5 mg and 2.5 mg Then alternate between 5 mg and 2.5 mg  Then 5 mg every other day Then 2.5 every other day  Will follow up in person in 2 months, discussed possible symptoms with tapering off of prednisone, patient will reach out if any questions or concerns in the interim  Further disposition pending results of labs. Discussed med's effects and SE's.   Over 15 minutes of exam, counseling, chart review, and critical decision making was performed.   Future Appointments  Date Time Provider [place]  12/19/2017 10:00 AM Unk Pinto, MD [facility] None  12/21/2017 10:30 AM Regina Eck, CNM [place] None  05/16/2018 10:45 AM Vicie Mutters, PA-C [facility] None    ------------------------------------------------------------------------------------------------------------------  HPI 67 y.o.female presents for 2 week f/u of Thyroiditis. 9 months ago , TSH was normal at 2.34, but 2 months ago was noted to be <0.01 and recheck was 0.01. Patient was asymptomatic w/o elevated BP, palpitations, tremors, weight loss, diarrhea.  Thyroid scan had low uptake  1% at 4 hrs (Nl 5-20%) and <1% at 24 hrs (Nl 10-30%). Patient was begun on Prednisone 40 mg/daily on 07/22/2017. She returned for recheck on 08/08/2017 and noted normalized TSH of 0.61 and prednisone was decreased to 10 mg daily. She is having some difficulty sleeping at night but otherwise doing well.   Past Medical History:  Diagnosis Date  . Acute lower GI bleeding 02/16/2016  . Heart murmur   . Hyperlipidemia   . Hypertension   . Prediabetes   . Seasonal allergies   . Vitamin D deficiency      Allergies  Allergen Reactions  .  Amoxil [Amoxicillin] Rash  . Ampicillin Rash    Current Outpatient Medications on File Prior to Visit  Medication Sig  . acetaminophen (TYLENOL) 325 MG tablet Take 2 tablets (650 mg total) by mouth every 6 (six) hours as needed for mild pain (or Fever >/= 101).  . bisoprolol-hydrochlorothiazide (ZIAC) 5-6.25 MG tablet Take 1 tablet by mouth daily.  . calcium carbonate (OS-CAL) 600 MG TABS tablet Take 600 mg by mouth daily with breakfast.  . Cholecalciferol (VITAMIN D PO) Take 5,000 Units by mouth every morning.   . fish oil-omega-3 fatty acids 1000 MG capsule Take 1 g by mouth 3 (three) times daily with meals.   . Flaxseed, Linseed, (FLAXSEED OIL PO) Take 1 tablet by mouth 3 (three) times daily with meals.   . meloxicam (MOBIC) 15 MG tablet TAKE ONE TABLET BY MOUTH ONCE DAILY AS NEEDED FOR PAIN WITH FOOD  . Multiple Vitamin (MULTIVITAMIN) tablet Take 1 tablet by mouth every morning.   . propranolol (INDERAL) 20 MG tablet TAKE 1 TABLET BY MOUTH AT BEDTIME  . MAGNESIUM PO Take 400 mg by mouth every morning.    No current facility-administered medications on file prior to visit.     ROS: all negative except above.   Physical Exam:  BP 118/72   Pulse 60   Temp 97.8 F (36.6 C)   Resp 14   Ht 5\' 8"  (1.727 m)   Wt 149 lb 3.2 oz (67.7 kg)   LMP 11/17/2002   SpO2 99%   BMI 22.69 kg/m  General Appearance: Well nourished, in no apparent distress. Eyes: conjunctiva no swelling or erythema, Hearing normal.  Neck: Supple, thyroid normal.  Respiratory: Respiratory effort normal, BS equal bilaterally without rales, rhonchi, wheezing or stridor.  Cardio: RRR with no MRGs. Brisk peripheral pulses without edema.  Abdomen: Soft, + BS.   Lymphatics: Non tender without lymphadenopathy.  Musculoskeletal: normal gait.  Skin: Warm, dry without rashes, lesions, ecchymosis.  Psych: Awake and oriented X 3, normal affect, Insight and Judgment appropriate.     Izora Ribas, NP 10:55  AM [facility]

## 2017-08-24 ENCOUNTER — Encounter: Payer: Self-pay | Admitting: Adult Health

## 2017-08-24 ENCOUNTER — Ambulatory Visit (INDEPENDENT_AMBULATORY_CARE_PROVIDER_SITE_OTHER): Payer: Medicare HMO | Admitting: Adult Health

## 2017-08-24 VITALS — BP 118/72 | HR 60 | Temp 97.8°F | Resp 14 | Ht 68.0 in | Wt 149.2 lb

## 2017-08-24 DIAGNOSIS — E069 Thyroiditis, unspecified: Secondary | ICD-10-CM | POA: Diagnosis not present

## 2017-08-24 MED ORDER — PREDNISONE 5 MG PO TABS: ORAL_TABLET | ORAL | 1 refills | Status: DC

## 2017-08-24 NOTE — Patient Instructions (Addendum)
Start taking 10 mg daily alternating with 5 mg for 2-3 weeks, then call for further directions  Then alternate between 7.5 mg and 5 mg   Then alternate between 7.5 mg and 2.5 mg  Then alternate between 5 mg and 2.5 mg   Then 5 mg every other day  Then 2.5 every other day    What are the signs or symptoms? Common symptoms of this condition include:  Severe fatigue.  Muscle weakness.  Loss of appetite.

## 2017-09-01 ENCOUNTER — Other Ambulatory Visit: Payer: Self-pay | Admitting: Adult Health

## 2017-09-01 MED ORDER — PREDNISONE 5 MG PO TABS: ORAL_TABLET | ORAL | 1 refills | Status: DC

## 2017-09-15 ENCOUNTER — Other Ambulatory Visit: Payer: Self-pay | Admitting: Internal Medicine

## 2017-11-04 ENCOUNTER — Ambulatory Visit: Payer: Self-pay | Admitting: Adult Health

## 2017-11-06 ENCOUNTER — Other Ambulatory Visit: Payer: Self-pay | Admitting: Internal Medicine

## 2017-11-11 DIAGNOSIS — H40013 Open angle with borderline findings, low risk, bilateral: Secondary | ICD-10-CM | POA: Diagnosis not present

## 2017-11-25 ENCOUNTER — Ambulatory Visit (INDEPENDENT_AMBULATORY_CARE_PROVIDER_SITE_OTHER): Payer: Medicare HMO

## 2017-11-25 VITALS — Temp 97.5°F

## 2017-11-25 DIAGNOSIS — Z23 Encounter for immunization: Secondary | ICD-10-CM

## 2017-12-09 ENCOUNTER — Other Ambulatory Visit: Payer: Self-pay | Admitting: Internal Medicine

## 2017-12-09 DIAGNOSIS — Z1231 Encounter for screening mammogram for malignant neoplasm of breast: Secondary | ICD-10-CM

## 2017-12-18 ENCOUNTER — Encounter: Payer: Self-pay | Admitting: Internal Medicine

## 2017-12-18 NOTE — Patient Instructions (Signed)

## 2017-12-18 NOTE — Progress Notes (Signed)
[facility] Unk Pinto, M.D.     Uvaldo Bristle. Silverio Lay, P.A.-C Liane Comber, [place] [address] [place], [place] [address] Telephone [phone] Telefax [phone] Annual Screening/Preventative Visit & Comprehensive Evaluation &  Examination     This very nice 67 y.o. MWF presents for a Screening /Preventative Visit & comprehensive evaluation and management of multiple medical co-morbidities.  Patient has been followed for HTN, HLD, Prediabetes  and Vitamin D Deficiency.     Patient was dx'd in May / July 2019  with a Thyroiditis and treated with Prednisone pulse - then tapered off since September.       HTN predates circa 2001. Patient's BP has been controlled at home and patient denies any cardiac symptoms as chest pain, palpitations, shortness of breath, dizziness or ankle swelling. Today's BP is at goal - 126/64.      Patient's hyperlipidemia is controlled with diet and medications. Patient denies myalgias or other medication SE's. Last lipids were at goal: Lab Results  Component Value Date   CHOL 149 06/02/2017   HDL 52 06/02/2017   LDLCALC 77 06/02/2017   TRIG 117 06/02/2017   CHOLHDL 2.9 06/02/2017      Patient has hx/o prediabetes (A1c 6.0% / 2014 ,  6.1% / 2016 and 5.9% / 2017)  and patient denies reactive hypoglycemic symptoms, visual blurring, diabetic polys or paresthesias. Last A1c was back to Normal & at goal: Lab Results  Component Value Date   HGBA1C 5.6 06/02/2017      Finally, patient has history of Vitamin D Deficiency ("24" / 2008)  and last Vitamin D was at goal: Lab Results  Component Value Date   VD25OH 76 11/08/2016   Current Outpatient Medications on File Prior to Visit  Medication Sig  . acetaminophen (TYLENOL) 325 MG tablet Take 2 tablets (650 mg total) by mouth every 6 (six) hours as needed for mild pain (or Fever >/= 101).  . bisoprolol-hydrochlorothiazide (ZIAC)  5-6.25 MG tablet TAKE 1 TABLET BY MOUTH ONCE DAILY  . calcium carbonate (OS-CAL) 600 MG TABS tablet Take 600 mg by mouth daily with breakfast.  . Cholecalciferol (VITAMIN D PO) Take 5,000 Units by mouth every morning.   . fish oil-omega-3 fatty acids 1000 MG capsule Take 1 g by mouth 3 (three) times daily with meals.   . Flaxseed, Linseed, (FLAXSEED OIL PO) Take 1 tablet by mouth 3 (three) times daily with meals.   . meloxicam (MOBIC) 15 MG tablet TAKE ONE TABLET BY MOUTH ONCE DAILY AS NEEDED FOR PAIN WITH FOOD  . Multiple Vitamin (MULTIVITAMIN) tablet Take 1 tablet by mouth every morning.    No current facility-administered medications on file prior to visit.    Allergies  Allergen Reactions  . Amoxil [Amoxicillin] Rash  . Ampicillin Rash   Past Medical History:  Diagnosis Date  . Acute lower GI bleeding 02/16/2016  . Heart murmur   . Hyperlipidemia   . Hypertension   . Prediabetes   . Seasonal allergies   . Vitamin D deficiency    Health Maintenance  Topic Date Due  . PNA vac Low Risk Adult (2 of 2 - PPSV23) 01/07/2017  . MAMMOGRAM  01/22/2019  . COLONOSCOPY  02/15/2021  . TETANUS/TDAP  07/06/2022  . INFLUENZA VACCINE  Completed  . DEXA SCAN  Completed  . Hepatitis C Screening  Completed   Immunization History  Administered Date(s) Administered  . Influenza Split 01/03/2013  . Influenza,  High Dose Seasonal PF 10/23/2015, 11/08/2016, 11/25/2017  . PPD Test 07/13/2013  . Pneumococcal Conjugate-13 01/08/2016  . Pneumococcal-Unspecified 01/18/2002  . Tdap 01/19/2003, 07/05/2012  . Zoster 07/05/2012   Last Colon - 02/16/2016 - Dr Carlean Purl - recc 5 yr f/u due Jan/Feb 2023)   MGM - 01/21/2017  & next scheduled  01/25/2018  Past Surgical History:  Procedure Laterality Date  . CARDIAC SURGERY  1958   at age 55 for hole in heart  . CERVIX LESION DESTRUCTION N/A ? years ago   with abnormal pap  . COLONOSCOPY     polyps  . COLONOSCOPY N/A 02/16/2016   Procedure: COLONOSCOPY;   Surgeon: Ladene Artist, MD;  Location: [facility];  Service: Endoscopy;  Laterality: N/A;  . TUBAL LIGATION     Family History  Problem Relation Age of Onset  . Colon cancer Sister 18  . Diabetes Sister   . Diabetes Father   . Diabetes Brother   . Diabetes Sister   . Stomach cancer Neg Hx   . Breast cancer Neg Hx    Social History   Tobacco Use  . Smoking status: Never Smoker  . Smokeless tobacco: Never Used  Substance Use Topics  . Alcohol use: Yes    Alcohol/week: 3.0 standard drinks    Types: 3 Standard drinks or equivalent per week    Comment: occasional wine  . Drug use: No    ROS Constitutional: Denies fever, chills, weight loss/gain, headaches, insomnia,  night sweats, and change in appetite. Does c/o fatigue. Eyes: Denies redness, blurred vision, diplopia, discharge, itchy, watery eyes.  ENT: Denies discharge, congestion, post nasal drip, epistaxis, sore throat, earache, hearing loss, dental pain, Tinnitus, Vertigo, Sinus pain, snoring.  Cardio: Denies chest pain, palpitations, irregular heartbeat, syncope, dyspnea, diaphoresis, orthopnea, PND, claudication, edema Respiratory: denies cough, dyspnea, DOE, pleurisy, hoarseness, laryngitis, wheezing.  Gastrointestinal: Denies dysphagia, heartburn, reflux, water brash, pain, cramps, nausea, vomiting, bloating, diarrhea, constipation, hematemesis, melena, hematochezia, jaundice, hemorrhoids Genitourinary: Denies dysuria, frequency, urgency, nocturia, hesitancy, discharge, hematuria, flank pain Breast: Breast lumps, nipple discharge, bleeding.  Musculoskeletal: Denies arthralgia, myalgia, stiffness, Jt. Swelling, pain, limp, and strain/sprain. Denies falls. Skin: Denies puritis, rash, hives, warts, acne, eczema, changing in skin lesion Neuro: No weakness, tremor, incoordination, spasms, paresthesia, pain Psychiatric: Denies confusion, memory loss, sensory loss. Denies Depression. Endocrine: Denies change in weight, skin,  hair change, nocturia, and paresthesia, diabetic polys, visual blurring, hyper / hypo glycemic episodes.  Heme/Lymph: No excessive bleeding, bruising, enlarged lymph nodes.  Physical Exam  BP 126/64   Pulse 60   Temp (!) 97.5 F (36.4 C)   Resp 16   Ht 5\' 8"  (1.727 m)   Wt 150 lb 3.2 oz (68.1 kg)   LMP 11/17/2002   BMI 22.84 kg/m   General Appearance: Well nourished, well groomed and in no apparent distress.  Eyes: PERRLA, EOMs, conjunctiva no swelling or erythema, normal fundi and vessels. Sinuses: No frontal/maxillary tenderness ENT/Mouth: EACs patent / TMs  nl. Nares clear without erythema, swelling, mucoid exudates. Oral hygiene is good. No erythema, swelling, or exudate. Tongue normal, non-obstructing. Tonsils not swollen or erythematous. Hearing normal.  Neck: Supple, thyroid not palpable. No bruits, nodes or JVD. Respiratory: Respiratory effort normal.  BS equal and clear bilateral without rales, rhonci, wheezing or stridor. Cardio: Heart sounds are normal with regular rate and rhythm and no murmurs, rubs or gallops. Peripheral pulses are normal and equal bilaterally without edema. No aortic or femoral bruits. Chest: symmetric with  normal excursions and percussion. Breasts: Deferred to [place], CNM - due in 2 days Abdomen: Flat, soft with bowel sounds active. Nontender, no guarding, rebound, hernias, masses, or organomegaly.  Lymphatics: Non tender without lymphadenopathy.  Genitourinary: Deferred to [place], CNM - due in 2 days Musculoskeletal: Full ROM all peripheral extremities, joint stability, 5/5 strength, and normal gait. Skin: Warm and dry without rashes, lesions, cyanosis, clubbing or  ecchymosis.  Neuro: Cranial nerves intact, reflexes equal bilaterally. Normal muscle tone, no cerebellar symptoms. Sensation intact.  Pysch: Alert and oriented X 3, normal affect, Insight and Judgment appropriate.   Assessment and Plan  1. Annual  Preventative Screening Examination  2. Essential hypertension  - EKG 12-Lead - Urinalysis, Routine w reflex microscopic - Microalbumin / creatinine urine ratio - CBC with Differential/Platelet - COMPLETE METABOLIC PANEL WITH GFR - Magnesium - TSH  3. Hyperlipidemia, mixed  - EKG 12-Lead - Lipid panel - TSH  4. Abnormal glucose  - Hemoglobin A1c - Insulin, random  5. Vitamin D deficiency  - VITAMIN D 25 Hydroxyl  6. Prediabetes  - Hemoglobin A1c - Insulin, random  7. Thyroiditis  - TSH  8. Iron deficiency anemial  - Iron,Total/Total Iron Binding Cap - CBC with Differential/Platelet  9. Screening for colorectal cancer  - POC Hemoccult Bld/Stll  10. Screening for ischemic heart disease  - EKG 12-Lead  11. Medication management  - Urinalysis, Routine w reflex microscopic - Microalbumin / creatinine urine ratio - CBC with Differential/Platelet - COMPLETE METABOLIC PANEL WITH GFR - Magnesium - Lipid panel - TSH - Hemoglobin A1c - Insulin, random - VITAMIN D 25 Hydroxyl        Patient was counseled in prudent diet to achieve/maintain BMI less than 25 for weight control, BP monitoring, regular exercise and medications. Discussed med's effects and SE's. Screening labs and tests as requested with regular follow-up as recommended. Over 40 minutes of exam, counseling, chart review and high complex critical decision making was performed.

## 2017-12-19 ENCOUNTER — Encounter: Payer: Self-pay | Admitting: Internal Medicine

## 2017-12-19 ENCOUNTER — Ambulatory Visit (INDEPENDENT_AMBULATORY_CARE_PROVIDER_SITE_OTHER): Payer: Medicare HMO | Admitting: Internal Medicine

## 2017-12-19 VITALS — BP 126/64 | HR 60 | Temp 97.5°F | Resp 16 | Ht 68.0 in | Wt 150.2 lb

## 2017-12-19 DIAGNOSIS — R7309 Other abnormal glucose: Secondary | ICD-10-CM | POA: Diagnosis not present

## 2017-12-19 DIAGNOSIS — Z23 Encounter for immunization: Secondary | ICD-10-CM | POA: Diagnosis not present

## 2017-12-19 DIAGNOSIS — D509 Iron deficiency anemia, unspecified: Secondary | ICD-10-CM | POA: Diagnosis not present

## 2017-12-19 DIAGNOSIS — Z136 Encounter for screening for cardiovascular disorders: Secondary | ICD-10-CM

## 2017-12-19 DIAGNOSIS — Z1212 Encounter for screening for malignant neoplasm of rectum: Secondary | ICD-10-CM

## 2017-12-19 DIAGNOSIS — E782 Mixed hyperlipidemia: Secondary | ICD-10-CM

## 2017-12-19 DIAGNOSIS — Z0001 Encounter for general adult medical examination with abnormal findings: Secondary | ICD-10-CM

## 2017-12-19 DIAGNOSIS — E069 Thyroiditis, unspecified: Secondary | ICD-10-CM | POA: Diagnosis not present

## 2017-12-19 DIAGNOSIS — I10 Essential (primary) hypertension: Secondary | ICD-10-CM | POA: Diagnosis not present

## 2017-12-19 DIAGNOSIS — Z0000 Encounter for general adult medical examination without abnormal findings: Secondary | ICD-10-CM | POA: Diagnosis not present

## 2017-12-19 DIAGNOSIS — E559 Vitamin D deficiency, unspecified: Secondary | ICD-10-CM | POA: Diagnosis not present

## 2017-12-19 DIAGNOSIS — R7303 Prediabetes: Secondary | ICD-10-CM

## 2017-12-19 DIAGNOSIS — Z1211 Encounter for screening for malignant neoplasm of colon: Secondary | ICD-10-CM

## 2017-12-19 DIAGNOSIS — Z79899 Other long term (current) drug therapy: Secondary | ICD-10-CM | POA: Diagnosis not present

## 2017-12-20 LAB — COMPLETE METABOLIC PANEL WITH GFR
AG Ratio: 1.9 (calc) (ref 1.0–2.5)
ALT: 19 U/L (ref 6–29)
AST: 20 U/L (ref 10–35)
Albumin: 4.7 g/dL (ref 3.6–5.1)
Alkaline phosphatase (APISO): 47 U/L (ref 33–130)
BUN: 17 mg/dL (ref 7–25)
CO2: 28 mmol/L (ref 20–32)
Calcium: 10.2 mg/dL (ref 8.6–10.4)
Chloride: 102 mmol/L (ref 98–110)
Creat: 0.66 mg/dL (ref 0.50–0.99)
GFR, Est African American: 106 mL/min/{1.73_m2} (ref >=60)
GFR, Est Non African American: 91 mL/min/{1.73_m2} (ref >=60)
Globulin: 2.5 g/dL (calc) (ref 1.9–3.7)
Glucose, Bld: 98 mg/dL (ref 65–99)
Potassium: 4.1 mmol/L (ref 3.5–5.3)
Sodium: 142 mmol/L (ref 135–146)
Total Bilirubin: 0.7 mg/dL (ref 0.2–1.2)
Total Protein: 7.2 g/dL (ref 6.1–8.1)

## 2017-12-20 LAB — CBC WITH DIFFERENTIAL/PLATELET
Basophils Absolute: 29 cells/uL (ref 0–200)
Basophils Relative: 0.7 %
Eosinophils Absolute: 42 cells/uL (ref 15–500)
Eosinophils Relative: 1 %
HCT: 38.7 % (ref 35.0–45.0)
Hemoglobin: 12.9 g/dL (ref 11.7–15.5)
Lymphs Abs: 1428 cells/uL (ref 850–3900)
MCH: 30.6 pg (ref 27.0–33.0)
MCHC: 33.3 g/dL (ref 32.0–36.0)
MCV: 91.7 fL (ref 80.0–100.0)
MPV: 11.7 fL (ref 7.5–12.5)
Monocytes Relative: 6.4 %
Neutro Abs: 2432 cells/uL (ref 1500–7800)
Neutrophils Relative %: 57.9 %
Platelets: 217 10*3/uL (ref 140–400)
RBC: 4.22 10*6/uL (ref 3.80–5.10)
RDW: 11.4 % (ref 11.0–15.0)
Total Lymphocyte: 34 %
WBC mixed population: 269 cells/uL (ref 200–950)
WBC: 4.2 10*3/uL (ref 3.8–10.8)

## 2017-12-20 LAB — LIPID PANEL
Cholesterol: 182 mg/dL (ref <200)
HDL: 64 mg/dL (ref >50)
LDL Cholesterol (Calc): 98 mg/dL (calc)
Non-HDL Cholesterol (Calc): 118 mg/dL (calc) (ref <130)
Total CHOL/HDL Ratio: 2.8 (calc) (ref <5.0)
Triglycerides: 107 mg/dL (ref <150)

## 2017-12-20 LAB — URINALYSIS, ROUTINE W REFLEX MICROSCOPIC
Bilirubin Urine: NEGATIVE
Glucose, UA: NEGATIVE
Hgb urine dipstick: NEGATIVE
Ketones, ur: NEGATIVE
Leukocytes, UA: NEGATIVE
Nitrite: NEGATIVE
Protein, ur: NEGATIVE
Specific Gravity, Urine: 1.007 (ref 1.001–1.03)
pH: 5.5 (ref 5.0–8.0)

## 2017-12-20 LAB — VITAMIN D 25 HYDROXY (VIT D DEFICIENCY, FRACTURES): Vit D, 25-Hydroxy: 86 ng/mL (ref 30–100)

## 2017-12-20 LAB — MICROALBUMIN / CREATININE URINE RATIO
Creatinine, Urine: 22 mg/dL (ref 20–275)
Microalb, Ur: <0.2 mg/dL

## 2017-12-20 LAB — IRON, TOTAL/TOTAL IRON BINDING CAP
%SAT: 34 % (calc) (ref 16–45)
Iron: 132 ug/dL (ref 45–160)
TIBC: 387 mcg/dL (calc) (ref 250–450)

## 2017-12-20 LAB — HEMOGLOBIN A1C
Hgb A1c MFr Bld: 5.6 % of total Hgb (ref <5.7)
Mean Plasma Glucose: 114 (calc)
eAG (mmol/L): 6.3 (calc)

## 2017-12-20 LAB — MAGNESIUM: Magnesium: 1.8 mg/dL (ref 1.5–2.5)

## 2017-12-20 LAB — TSH: TSH: 3.87 mIU/L (ref 0.40–4.50)

## 2017-12-20 LAB — INSULIN, RANDOM: Insulin: 5.9 u[IU]/mL (ref 2.0–19.6)

## 2017-12-21 ENCOUNTER — Ambulatory Visit (INDEPENDENT_AMBULATORY_CARE_PROVIDER_SITE_OTHER): Payer: Medicare HMO | Admitting: Certified Nurse Midwife

## 2017-12-21 ENCOUNTER — Other Ambulatory Visit (HOSPITAL_COMMUNITY)
Admission: RE | Admit: 2017-12-21 | Discharge: 2017-12-21 | Disposition: A | Discharge status: Home / Self Care | Payer: Medicare HMO | Source: Ambulatory Visit | Attending: Certified Nurse Midwife | Admitting: Certified Nurse Midwife

## 2017-12-21 ENCOUNTER — Encounter: Payer: Self-pay | Admitting: Certified Nurse Midwife

## 2017-12-21 ENCOUNTER — Other Ambulatory Visit: Payer: Self-pay

## 2017-12-21 VITALS — BP 118/68 | HR 68 | Resp 16 | Ht 67.75 in | Wt 148.0 lb

## 2017-12-21 DIAGNOSIS — Z01419 Encounter for gynecological examination (general) (routine) without abnormal findings: Secondary | ICD-10-CM

## 2017-12-21 DIAGNOSIS — Z124 Encounter for screening for malignant neoplasm of cervix: Secondary | ICD-10-CM | POA: Insufficient documentation

## 2017-12-21 DIAGNOSIS — N8189 Other female genital prolapse: Secondary | ICD-10-CM

## 2017-12-21 DIAGNOSIS — N393 Stress incontinence (female) (male): Secondary | ICD-10-CM

## 2017-12-21 DIAGNOSIS — N952 Postmenopausal atrophic vaginitis: Secondary | ICD-10-CM

## 2017-12-21 NOTE — Patient Instructions (Signed)
Urinary Incontinence Urinary incontinence is the involuntary loss of urine from your bladder. What are the causes? There are many causes of urinary incontinence. They include:  Medicines.  Infections.  Prostatic enlargement, leading to overflow of urine from your bladder.  Surgery.  Neurological diseases.  Emotional factors.  What are the signs or symptoms? Urinary Incontinence can be divided into four types: 1. Urge incontinence. Urge incontinence is the involuntary loss of urine before you have the opportunity to go to the bathroom. There is a sudden urge to void but not enough time to reach a bathroom. 2. Stress incontinence. Stress incontinence is the sudden loss of urine with any activity that forces urine to pass. It is commonly caused by anatomical changes to the pelvis and sphincter areas of your body. 3. Overflow incontinence. Overflow incontinence is the loss of urine from an obstructed opening to your bladder. This results in a backup of urine and a resultant buildup of pressure within the bladder. When the pressure within the bladder exceeds the closing pressure of the sphincter, the urine overflows, which causes incontinence, similar to water overflowing a dam. 4. Total incontinence. Total incontinence is the loss of urine as a result of the inability to store urine within your bladder.  How is this diagnosed? Evaluating the cause of incontinence may require:  A thorough and complete medical and obstetric history.  A complete physical exam.  Laboratory tests such as a urine culture and sensitivities.  When additional tests are indicated, they can include:  An ultrasound exam.  Kidney and bladder X-rays.  Cystoscopy. This is an exam of the bladder using a narrow scope.  Urodynamic testing to test the nerve function to the bladder and sphincter areas.  How is this treated? Treatment for urinary incontinence depends on the cause:  For urge incontinence caused  by a bacterial infection, antibiotics will be prescribed. If the urge incontinence is related to medicines you take, your health care provider may have you change the medicine.  For stress incontinence, surgery to re-establish anatomical support to the bladder or sphincter, or both, will often correct the condition.  For overflow incontinence caused by an enlarged prostate, an operation to open the channel through the enlarged prostate will allow the flow of urine out of the bladder. In women with fibroids, a hysterectomy may be recommended.  For total incontinence, surgery on your urinary sphincter may help. An artificial urinary sphincter (an inflatable cuff placed around the urethra) may be required. In women who have developed a hole-like passage between their bladder and vagina (vesicovaginal fistula), surgery to close the fistula often is required.  Follow these instructions at home:  Normal daily hygiene and the use of pads or adult diapers that are changed regularly will help prevent odors and skin damage.  Avoid caffeine. It can overstimulate your bladder.  Use the bathroom regularly. Try about every 2-3 hours to go to the bathroom, even if you do not feel the need to do so. Take time to empty your bladder completely. After urinating, wait a minute. Then try to urinate again.  For causes involving nerve dysfunction, keep a log of the medicines you take and a journal of the times you go to the bathroom. Contact a health care provider if:  You experience worsening of pain instead of improvement in pain after your procedure.  Your incontinence becomes worse instead of better. Get help right away if:  You experience fever or shaking chills.  You are unable to   pass your urine.  You have redness spreading into your groin or down into your thighs. This information is not intended to replace advice given to you by your health care provider. Make sure you discuss any questions you have  with your health care provider. Document Released: 02/12/2004 Document Revised: 08/15/2015 Document Reviewed: 06/13/2012 Elsevier Interactive Patient Education  2018 [employer] Inc[employer]  

## 2017-12-21 NOTE — Progress Notes (Signed)
67 y.o. G19P2002 Married  Caucasian Fe here for annual exam. Post menopausal no HRT. Uses coconut oil for vaginal dryness with good results. Denies vaginal bleeding. Has recovered from blood loss with colonoscopy. Patient has continued with occasional urinary leakage when holding urine, working on this and seems to be help. Has noted some stool leakage with loose stools, every other week? No blood in stools. Not eating fiber but occasional. Sees PCP for aex, labs, hypertension/cholesterol management, all stable per patient. No other health issues today.  Patient's last menstrual period was 11/17/2002.          Sexually active: Yes.    The current method of family planning is post menopausal status & BTL.    Exercising: Yes.    walking Smoker:  no  Review of Systems  Constitutional: Negative.   HENT: Negative.   Eyes: Negative.   Respiratory: Negative.   Cardiovascular: Negative.   Gastrointestinal: Negative.   Genitourinary:       Loss of urine  Musculoskeletal: Negative.   Skin: Negative.   Neurological: Negative.   Endo/Heme/Allergies: Negative.   Psychiatric/Behavioral: Negative.     Health Maintenance: Pap:  11-26-14 neg HPV HR neg History of Abnormal Pap: cryo years ao MMG:  01-21-17 category b density birads 1:neg Self Breast exams: occ Colonoscopy:  2018 polyps f/u 54yrs with Dr Carlean Purl, Had GI bleed & was seen in ER BMD:  2018 osteopenia TDaP:  2014 Shingles: 2014 Pneumonia: 2019 Hep C and HIV: both neg 2015 Labs: if needed   reports that she has never smoked. She has never used smokeless tobacco. She reports that she drinks about 7.0 standard drinks of alcohol per week. She reports that she does not use drugs.  Past Medical History:  Diagnosis Date  . Acute lower GI bleeding 02/16/2016  . Heart murmur   . Hyperlipidemia   . Hypertension   . Prediabetes   . Seasonal allergies   . Vitamin D deficiency     Past Surgical History:  Procedure Laterality Date  .  CARDIAC SURGERY  1958   at age 58 for hole in heart  . CERVIX LESION DESTRUCTION N/A ? years ago   with abnormal pap  . COLONOSCOPY     polyps  . COLONOSCOPY N/A 02/16/2016   Procedure: COLONOSCOPY;  Surgeon: Ladene Artist, MD;  Location: [facility];  Service: Endoscopy;  Laterality: N/A;  . TUBAL LIGATION      Current Outpatient Medications  Medication Sig Dispense Refill  . acetaminophen (TYLENOL) 325 MG tablet Take 2 tablets (650 mg total) by mouth every 6 (six) hours as needed for mild pain (or Fever >/= 101). 30 tablet 0  . bisoprolol-hydrochlorothiazide (ZIAC) 5-6.25 MG tablet TAKE 1 TABLET BY MOUTH ONCE DAILY 90 tablet 1  . calcium carbonate (OS-CAL) 600 MG TABS tablet Take 600 mg by mouth daily with breakfast.    . Cholecalciferol (VITAMIN D PO) Take 5,000 Units by mouth every morning.     . fish oil-omega-3 fatty acids 1000 MG capsule Take 1 g by mouth 3 (three) times daily with meals.     . Flaxseed, Linseed, (FLAXSEED OIL PO) Take 1 tablet by mouth 3 (three) times daily with meals.     . meloxicam (MOBIC) 15 MG tablet TAKE ONE TABLET BY MOUTH ONCE DAILY AS NEEDED FOR PAIN WITH FOOD 90 tablet 1  . Multiple Vitamin (MULTIVITAMIN) tablet Take 1 tablet by mouth every morning.      No current facility-administered  medications for this visit.     Family History  Problem Relation Age of Onset  . Colon cancer Sister 86  . Diabetes Sister   . Diabetes Father   . Diabetes Brother   . Diabetes Sister   . Stomach cancer Neg Hx   . Breast cancer Neg Hx     ROS:  Pertinent items are noted in HPI.  Otherwise, a comprehensive ROS was negative.  Exam:   BP 118/68   Pulse 68   Resp 16   Ht 5' 7.75" (1.721 m)   Wt 148 lb (67.1 kg)   LMP 11/17/2002   BMI 22.67 kg/m  Height: 5' 7.75" (172.1 cm) Ht Readings from Last 3 Encounters:  12/21/17 5' 7.75" (1.721 m)  12/19/17 5\' 8"  (1.727 m)  08/24/17 5\' 8"  (1.727 m)    General appearance: alert, cooperative and appears stated  age Head: Normocephalic, without obvious abnormality, atraumatic Neck: no adenopathy, supple, symmetrical, trachea midline and thyroid normal to inspection and palpation Lungs: clear to auscultation bilaterally Breasts: normal appearance, no masses or tenderness, No nipple retraction or dimpling, No nipple discharge or bleeding, No axillary or supraclavicular adenopathy Heart: regular rate and rhythm Abdomen: soft, non-tender; no masses,  no organomegaly Extremities: extremities normal, atraumatic, no cyanosis or edema Skin: Skin color, texture, turgor normal. No rashes or lesions Lymph nodes: Cervical, supraclavicular, and axillary nodes normal. No abnormal inguinal nodes palpated Neurologic: Grossly normal   Pelvic: External genitalia:  no lesions              Urethra:  normal appearing urethra with no masses, tenderness or lesions              Bartholin's and Skene's: normal                 Vagina: normal appearing vagina with normal color and discharge, no lesions, mild cystocele grade 1-2 noted              Cervix: multiparous appearance, no cervical motion tenderness and no lesions              Pap taken: Yes.   Bimanual Exam:  Uterus:  normal size, contour, position, consistency, mobility, non-tender              Adnexa: normal adnexa and no mass, fullness, tenderness               Rectovaginal: Confirms               Anus:  normal sphincter tone, no lesions, no hemorrhoids noted  Chaperone present: yes  A:  Well Woman with normal exam  Post menopausal dryness using coconut oil as needed  Stress incontinence with occasional stool incontinence, no rectocele noted. Poor vaginal tone noted  Hypertension/cholesterol medication management with PCP.  P:   Reviewed health and wellness pertinent to exam  Aware of need to advise if vaginal bleeding.  Discussed more frequent use of coconut oil to see if this would help vaginally for comfort.  Discussed vaginal finding and recommended  PT for pelvic floor evaluation and treatment. Patient agreeable to referral. Aware she will be called with information.  Continue follow up with PCP as indicated.  Pap smear: yes   counseled on breast self exam, mammography screening, feminine hygiene, adequate intake of calcium and vitamin D, diet and exercise, Kegel's exercises  return annually or prn  An After Visit Summary was printed and given to the patient.

## 2017-12-23 LAB — CYTOLOGY - PAP
Diagnosis: NEGATIVE
HPV: NOT DETECTED

## 2018-01-06 DIAGNOSIS — M62838 Other muscle spasm: Secondary | ICD-10-CM | POA: Diagnosis not present

## 2018-01-06 DIAGNOSIS — N3946 Mixed incontinence: Secondary | ICD-10-CM | POA: Diagnosis not present

## 2018-01-06 DIAGNOSIS — M6289 Other specified disorders of muscle: Secondary | ICD-10-CM | POA: Diagnosis not present

## 2018-01-06 DIAGNOSIS — M6281 Muscle weakness (generalized): Secondary | ICD-10-CM | POA: Diagnosis not present

## 2018-01-25 ENCOUNTER — Ambulatory Visit
Admission: RE | Admit: 2018-01-25 | Discharge: 2018-01-25 | Disposition: A | Discharge status: Home / Self Care | Payer: Medicare HMO | Source: Ambulatory Visit | Attending: Internal Medicine | Admitting: Internal Medicine

## 2018-01-25 DIAGNOSIS — Z1231 Encounter for screening mammogram for malignant neoplasm of breast: Secondary | ICD-10-CM | POA: Diagnosis not present

## 2018-01-26 DIAGNOSIS — M62838 Other muscle spasm: Secondary | ICD-10-CM | POA: Diagnosis not present

## 2018-01-26 DIAGNOSIS — M6281 Muscle weakness (generalized): Secondary | ICD-10-CM | POA: Diagnosis not present

## 2018-01-26 DIAGNOSIS — N3946 Mixed incontinence: Secondary | ICD-10-CM | POA: Diagnosis not present

## 2018-01-26 DIAGNOSIS — M6289 Other specified disorders of muscle: Secondary | ICD-10-CM | POA: Diagnosis not present

## 2018-02-03 DIAGNOSIS — M62838 Other muscle spasm: Secondary | ICD-10-CM | POA: Diagnosis not present

## 2018-02-03 DIAGNOSIS — N3946 Mixed incontinence: Secondary | ICD-10-CM | POA: Diagnosis not present

## 2018-02-03 DIAGNOSIS — M6289 Other specified disorders of muscle: Secondary | ICD-10-CM | POA: Diagnosis not present

## 2018-02-03 DIAGNOSIS — M6281 Muscle weakness (generalized): Secondary | ICD-10-CM | POA: Diagnosis not present

## 2018-02-09 DIAGNOSIS — R69 Illness, unspecified: Secondary | ICD-10-CM | POA: Diagnosis not present

## 2018-02-17 ENCOUNTER — Other Ambulatory Visit: Payer: Self-pay | Admitting: Internal Medicine

## 2018-02-23 DIAGNOSIS — M6281 Muscle weakness (generalized): Secondary | ICD-10-CM | POA: Diagnosis not present

## 2018-02-23 DIAGNOSIS — M62838 Other muscle spasm: Secondary | ICD-10-CM | POA: Diagnosis not present

## 2018-02-23 DIAGNOSIS — M6289 Other specified disorders of muscle: Secondary | ICD-10-CM | POA: Diagnosis not present

## 2018-02-23 DIAGNOSIS — N3946 Mixed incontinence: Secondary | ICD-10-CM | POA: Diagnosis not present

## 2018-03-08 DIAGNOSIS — H1013 Acute atopic conjunctivitis, bilateral: Secondary | ICD-10-CM | POA: Diagnosis not present

## 2018-03-23 DIAGNOSIS — N814 Uterovaginal prolapse, unspecified: Secondary | ICD-10-CM | POA: Insufficient documentation

## 2018-03-23 DIAGNOSIS — M85852 Other specified disorders of bone density and structure, left thigh: Secondary | ICD-10-CM | POA: Insufficient documentation

## 2018-03-23 NOTE — Patient Instructions (Addendum)
We will send your lab results to you in the mail.  If we need to discuss the results we will contact your via phone.  Your next appointment is for your Annual Wellness Visit.  At this appointment all of your health maintenance items will be reviewed.     We Do NOT Approve of  [facility], [employer]  Our Patients  To Do Home Visits & We Do NOT Approve of [employer] > > > > > > > > > > > > > > > > > > > > > > > > > > > > > > > > > > > > > > >  Preventive Care for Adults  A healthy lifestyle and preventive care can promote health and wellness. Preventive health guidelines for women include the following key practices.  A routine yearly physical is a good way to check with your health care provider about your health and preventive screening. It is a chance to share any concerns and updates on your health and to receive a thorough exam.  Visit your dentist for a routine exam and preventive care every 6 months. Brush your teeth twice a day and floss once a day. Good oral hygiene prevents tooth decay and gum disease.  The frequency of eye exams is based on your age, health, family medical history, use of contact lenses, and other factors. Follow your health care provider's recommendations for frequency of eye exams.  Eat a healthy diet. Foods like vegetables, fruits, whole grains, low-fat dairy products, and lean protein foods contain the nutrients you need without too many calories. Decrease your intake of foods high in solid fats, added sugars, and salt. Eat the right amount of calories for you. Get information about a proper diet from your health care provider, if necessary.  Regular physical exercise is one of the most important things you can do for your health. Most adults should get at least 150 minutes of moderate-intensity exercise (any activity that increases your heart rate and causes you to sweat) each week. In addition, most adults need  muscle-strengthening exercises on 2 or more days a week.  Maintain a healthy weight. The body mass index (BMI) is a screening tool to identify possible weight problems. It provides an estimate of body fat based on height and weight. Your health care provider can find your BMI and can help you achieve or maintain a healthy weight. For adults 20 years and older:  A BMI below 18.5 is considered underweight.  A BMI of 18.5 to 24.9 is normal.  A BMI of 25 to 29.9 is considered overweight.  A BMI of 30 and above is considered obese.  Maintain normal blood lipids and cholesterol levels by exercising and minimizing your intake of saturated fat. Eat a balanced diet with plenty of fruit and vegetables. If your lipid or cholesterol levels are high, you are over 50, or you are at high risk for heart disease, you may need your cholesterol levels checked more frequently. Ongoing high lipid and cholesterol levels should be treated with medicines if diet and exercise are not working.  If you smoke, find out from your health care provider how to quit. If you do not use tobacco, do not start.  Lung cancer screening is recommended for adults aged 3-80 years who are at high risk for developing lung cancer because of a history of smoking. A yearly low-dose CT scan of the lungs is recommended for people who have at  least a 30-pack-year history of smoking and are a current smoker or have quit within the past 15 years. A pack year of smoking is smoking an average of 1 pack of cigarettes a day for 1 year (for example: 1 pack a day for 30 years or 2 packs a day for 15 years). Yearly screening should continue until the smoker has stopped smoking for at least 15 years. Yearly screening should be stopped for people who develop a health problem that would prevent them from having lung cancer treatment.  Avoid use of street drugs. Do not share needles with anyone. Ask for help if you need support or instructions about stopping  the use of drugs.  High blood pressure causes heart disease and increases the risk of stroke.  Ongoing high blood pressure should be treated with medicines if weight loss and exercise do not work.  If you are 84-45 years old, ask your health care provider if you should take aspirin to prevent strokes.  Diabetes screening involves taking a blood sample to check your fasting blood sugar level. This should be done once every 3 years, after age 45, if you are within normal weight and without risk factors for diabetes. Testing should be considered at a younger age or be carried out more frequently if you are overweight and have at least 1 risk factor for diabetes.  Breast cancer screening is essential preventive care for women. You should practice "breast self-awareness." This means understanding the normal appearance and feel of your breasts and may include breast self-examination. Any changes detected, no matter how small, should be reported to a health care provider. Women in their 61s and 30s should have a clinical breast exam (CBE) by a health care provider as part of a regular health exam every 1 to 3 years. After age 59, women should have a CBE every year. Starting at age 23, women should consider having a mammogram (breast X-ray test) every year. Women who have a family history of breast cancer should talk to their health care provider about genetic screening. Women at a high risk of breast cancer should talk to their health care providers about having an MRI and a mammogram every year.  Breast cancer gene (BRCA)-related cancer risk assessment is recommended for women who have family members with BRCA-related cancers. BRCA-related cancers include breast, ovarian, tubal, and peritoneal cancers. Having family members with these cancers may be associated with an increased risk for harmful changes (mutations) in the breast cancer genes BRCA1 and BRCA2. Results of the assessment will determine the need for  genetic counseling and BRCA1 and BRCA2 testing.  Routine pelvic exams to screen for cancer are no longer recommended for nonpregnant women who are considered low risk for cancer of the pelvic organs (ovaries, uterus, and vagina) and who do not have symptoms. Ask your health care provider if a screening pelvic exam is right for you.  If you have had past treatment for cervical cancer or a condition that could lead to cancer, you need Pap tests and screening for cancer for at least 20 years after your treatment. If Pap tests have been discontinued, your risk factors (such as having a new sexual partner) need to be reassessed to determine if screening should be resumed. Some women have medical problems that increase the chance of getting cervical cancer. In these cases, your health care provider may recommend more frequent screening and Pap tests.    Colorectal cancer can be detected and often prevented. Most  routine colorectal cancer screening begins at the age of 33 years and continues through age 49 years. However, your health care provider may recommend screening at an earlier age if you have risk factors for colon cancer. On a yearly basis, your health care provider may provide home test kits to check for hidden blood in the stool. Use of a small camera at the end of a tube, to directly examine the colon (sigmoidoscopy or colonoscopy), can detect the earliest forms of colorectal cancer. Talk to your health care provider about this at age 17, when routine screening begins.  Direct exam of the colon should be repeated every 5-10 years through age 67 years, unless early forms of pre-cancerous polyps or small growths are found.  Osteoporosis is a disease in which the bones lose minerals and strength with aging. This can result in serious bone fractures or breaks. The risk of osteoporosis can be identified using a bone density scan. Women ages 76 years and over and women at risk for fractures or osteoporosis  should discuss screening with their health care providers. Ask your health care provider whether you should take a calcium supplement or vitamin D to reduce the rate of osteoporosis.  Menopause can be associated with physical symptoms and risks. Hormone replacement therapy is available to decrease symptoms and risks. You should talk to your health care provider about whether hormone replacement therapy is right for you.  Use sunscreen. Apply sunscreen liberally and repeatedly throughout the day. You should seek shade when your shadow is shorter than you. Protect yourself by wearing long sleeves, pants, a wide-brimmed hat, and sunglasses year round, whenever you are outdoors.  Once a month, do a whole body skin exam, using a mirror to look at the skin on your back. Tell your health care provider of new moles, moles that have irregular borders, moles that are larger than a pencil eraser, or moles that have changed in shape or color.  Stay current with required vaccines (immunizations).  Influenza vaccine. All adults should be immunized every year.  Tetanus, diphtheria, and acellular pertussis (Td, Tdap) vaccine. Pregnant women should receive 1 dose of Tdap vaccine during each pregnancy. The dose should be obtained regardless of the length of time since the last dose. Immunization is preferred during the 27th-36th week of gestation. An adult who has not previously received Tdap or who does not know her vaccine status should receive 1 dose of Tdap. This initial dose should be followed by tetanus and diphtheria toxoids (Td) booster doses every 10 years. Adults with an unknown or incomplete history of completing a 3-dose immunization series with Td-containing vaccines should begin or complete a primary immunization series including a Tdap dose. Adults should receive a Td booster every 10 years.    Zoster vaccine. One dose is recommended for adults aged 72 years or older unless certain conditions are  present.    Pneumococcal 13-valent conjugate (PCV13) vaccine. When indicated, a person who is uncertain of her immunization history and has no record of immunization should receive the PCV13 vaccine. An adult aged 10 years or older who has certain medical conditions and has not been previously immunized should receive 1 dose of PCV13 vaccine. This PCV13 should be followed with a dose of pneumococcal polysaccharide (PPSV23) vaccine. The PPSV23 vaccine dose should be obtained at least 1 or more year(s) after the dose of PCV13 vaccine. An adult aged 71 years or older who has certain medical conditions and previously received 1 or more  doses of PPSV23 vaccine should receive 1 dose of PCV13. The PCV13 vaccine dose should be obtained 1 or more years after the last PPSV23 vaccine dose.    Pneumococcal polysaccharide (PPSV23) vaccine. When PCV13 is also indicated, PCV13 should be obtained first. All adults aged 34 years and older should be immunized. An adult younger than age 33 years who has certain medical conditions should be immunized. Any person who resides in a nursing home or long-term care facility should be immunized. An adult smoker should be immunized. People with an immunocompromised condition and certain other conditions should receive both PCV13 and PPSV23 vaccines. People with human immunodeficiency virus (HIV) infection should be immunized as soon as possible after diagnosis. Immunization during chemotherapy or radiation therapy should be avoided. Routine use of PPSV23 vaccine is not recommended for American Indians, [place] Natives, or people younger than 65 years unless there are medical conditions that require PPSV23 vaccine. When indicated, people who have unknown immunization and have no record of immunization should receive PPSV23 vaccine. One-time revaccination 5 years after the first dose of PPSV23 is recommended for people aged 19-64 years who have chronic kidney failure, nephrotic syndrome,  asplenia, or immunocompromised conditions. People who received 1-2 doses of PPSV23 before age 63 years should receive another dose of PPSV23 vaccine at age 71 years or later if at least 5 years have passed since the previous dose. Doses of PPSV23 are not needed for people immunized with PPSV23 at or after age 64 years.   Preventive Services / Frequency  Ages 73 years and over  Blood pressure check.  Lipid and cholesterol check.  Lung cancer screening. / Every year if you are aged 85-80 years and have a 30-pack-year history of smoking and currently smoke or have quit within the past 15 years. Yearly screening is stopped once you have quit smoking for at least 15 years or develop a health problem that would prevent you from having lung cancer treatment.  Clinical breast exam.** / Every year after age 1 years.   BRCA-related cancer risk assessment.** / For women who have family members with a BRCA-related cancer (breast, ovarian, tubal, or peritoneal cancers).  Mammogram.** / Every year beginning at age 44 years and continuing for as long as you are in good health. Consult with your health care provider.  Pap test.** / Every 3 years starting at age 67 years through age 42 or 16 years with 3 consecutive normal Pap tests. Testing can be stopped between 65 and 70 years with 3 consecutive normal Pap tests and no abnormal Pap or HPV tests in the past 10 years.  Fecal occult blood test (FOBT) of stool. / Every year beginning at age 79 years and continuing until age 57 years. You may not need to do this test if you get a colonoscopy every 10 years.  Flexible sigmoidoscopy or colonoscopy.** / Every 5 years for a flexible sigmoidoscopy or every 10 years for a colonoscopy beginning at age 52 years and continuing until age 64 years.  Hepatitis C blood test.** / For all people born from [birth year] through 1965 and any individual with known risks for hepatitis C.  Osteoporosis screening.** / A one-time  screening for women ages 6 years and over and women at risk for fractures or osteoporosis.  Skin self-exam. / Monthly.  Influenza vaccine. / Every year.  Tetanus, diphtheria, and acellular pertussis (Tdap/Td) vaccine.** / 1 dose of Td every 10 years.  Zoster vaccine.** / 1 dose for adults aged  57 years or older.  Pneumococcal 13-valent conjugate (PCV13) vaccine.** / Consult your health care provider.  Pneumococcal polysaccharide (PPSV23) vaccine.** / 1 dose for all adults aged 59 years and older. Screening for abdominal aortic aneurysm (AAA)  by ultrasound is recommended for people who have history of high blood pressure or who are current or former smokers. ++++++++++++++++++++ Recommend Adult Low Dose Aspirin or  coated  Aspirin 81 mg daily  To reduce risk of Colon Cancer 20 %,  Skin Cancer 26 % ,  Melanoma 46%  and  Pancreatic cancer 60% ++++++++++++++++++++ Vitamin D goal  is between 70-100.  Please make sure that you are taking your Vitamin D as directed.  It is very important as a natural anti-inflammatory  helping hair, skin, and nails, as well as reducing stroke and heart attack risk.  It helps your bones and helps with mood. It also decreases numerous cancer risks so please take it as directed.  Low Vit D is associated with a 200-300% higher risk for CANCER  and 200-300% higher risk for HEART   ATTACK  &  STROKE.   .....................................Marland Kitchen It is also associated with higher death rate at younger ages,  autoimmune diseases like Rheumatoid arthritis, Lupus, Multiple Sclerosis.    Also many other serious conditions, like depression, Alzheimer's Dementia, infertility, muscle aches, fatigue, fibromyalgia - just to name a few. ++++++++++++++++++ Recommend the book "The END of DIETING" by Dr Excell Seltzer  & the book "The END of DIABETES " by Dr Excell Seltzer At [email or web address] - get book & Audio CD's    Being diabetic has a  300% increased risk for heart attack,  stroke, cancer, and alzheimer- type vascular dementia. It is very important that you work harder with diet by avoiding all foods that are white. Avoid white rice (brown & wild rice is OK), white potatoes (sweetpotatoes in moderation is OK), White bread or wheat bread or anything made out of white flour like bagels, donuts, rolls, buns, biscuits, cakes, pastries, cookies, pizza crust, and pasta (made from white flour & egg whites) - vegetarian pasta or spinach or wheat pasta is OK. Multigrain breads like Arnold's or Pepper[employer], or multigrain sandwich thins or flatbreads.  Diet, exercise and weight loss can reverse and cure diabetes in the early stages.  Diet, exercise and weight loss is very important in the control and prevention of complications of diabetes which affects every system in your body, ie. Brain - dementia/stroke, eyes - glaucoma/blindness, heart - heart attack/heart failure, kidneys - dialysis, stomach - gastric paralysis, intestines - malabsorption, nerves - severe painful neuritis, circulation - gangrene & loss of a leg(s), and finally cancer and Alzheimers.    I recommend avoid fried & greasy foods,  sweets/candy, white rice (brown or wild rice or Quinoa is OK), white potatoes (sweet potatoes are OK) - anything made from white flour - bagels, doughnuts, rolls, buns, biscuits,white and wheat breads, pizza crust and traditional pasta made of white flour & egg white(vegetarian pasta or spinach or wheat pasta is OK).  Multi-grain bread is OK - like multi-grain flat bread or sandwich thins. Avoid alcohol in excess. Exercise is also important.    Eat all the vegetables you want - avoid meat, especially red meat and dairy - especially cheese.  Cheese is the most concentrated form of trans-fats which is the worst thing to clog up our arteries. Veggie cheese is OK which can be found in the fresh produce section at [facility] or [employer]  or [employer]  +++++++++++++++++++ DASH Eating  Plan  DASH stands for "Dietary Approaches to Stop Hypertension."   The DASH eating plan is a healthy eating plan that has been shown to reduce high blood pressure (hypertension). Additional health benefits may include reducing the risk of type 2 diabetes mellitus, heart disease, and stroke. The DASH eating plan may also help with weight loss. WHAT DO I NEED TO KNOW ABOUT THE DASH EATING PLAN? For the DASH eating plan, you will follow these general guidelines:  Choose foods with a percent daily value for sodium of less than 5% (as listed on the food label).  Use salt-free seasonings or herbs instead of table salt or sea salt.  Check with your health care provider or pharmacist before using salt substitutes.  Eat lower-sodium products, often labeled as "lower sodium" or "no salt added."  Eat fresh foods.  Eat more vegetables, fruits, and low-fat dairy products.  Choose whole grains. Look for the word "whole" as the first word in the ingredient list.  Choose fish   Limit sweets, desserts, sugars, and sugary drinks.  Choose heart-healthy fats.  Eat veggie cheese   Eat more home-cooked food and less restaurant, buffet, and fast food.  Limit fried foods.  Cook foods using methods other than frying.  Limit canned vegetables. If you do use them, rinse them well to decrease the sodium.  When eating at a restaurant, ask that your food be prepared with less salt, or no salt if possible.                      WHAT FOODS CAN I EAT? Read Dr Fara Olden Fuhrman's books on The End of Dieting & The End of Diabetes  Grains Whole grain or whole wheat bread. Brown rice. Whole grain or whole wheat pasta. Quinoa, bulgur, and whole grain cereals. Low-sodium cereals. Corn or whole wheat flour tortillas. Whole grain cornbread. Whole grain crackers. Low-sodium crackers.  Vegetables Fresh or frozen vegetables (raw, steamed, roasted, or grilled). Low-sodium or reduced-sodium tomato and vegetable juices.  Low-sodium or reduced-sodium tomato sauce and paste. Low-sodium or reduced-sodium canned vegetables.   Fruits All fresh, canned (in natural juice), or frozen fruits.  Protein Products  All fish and seafood.  Dried beans, peas, or lentils. Unsalted nuts and seeds. Unsalted canned beans.  Dairy Low-fat dairy products, such as skim or 1% milk, 2% or reduced-fat cheeses, low-fat ricotta or cottage cheese, or plain low-fat yogurt. Low-sodium or reduced-sodium cheeses.  Fats and Oils Tub margarines without trans fats. Light or reduced-fat mayonnaise and salad dressings (reduced sodium). Avocado. Safflower, olive, or canola oils. Natural peanut or almond butter.  Other Unsalted popcorn and pretzels. The items listed above may not be a complete list of recommended foods or beverages. Contact your dietitian for more options.  +++++++++++++++  WHAT FOODS ARE NOT RECOMMENDED? Grains/ White flour or wheat flour White bread. White pasta. White rice. Refined cornbread. Bagels and croissants. Crackers that contain trans fat.  Vegetables  Creamed or fried vegetables. Vegetables in a . Regular canned vegetables. Regular canned tomato sauce and paste. Regular tomato and vegetable juices.  Fruits Dried fruits. Canned fruit in light or heavy syrup. Fruit juice.  Meat and Other Protein Products Meat in general - RED meat & White meat.  Fatty cuts of meat. Ribs, chicken wings, all processed meats as bacon, sausage, bologna, salami, fatback, hot dogs, bratwurst and packaged luncheon meats.  Dairy Whole or 2% milk, cream, half-and-half, and  cream cheese. Whole-fat or sweetened yogurt. Full-fat cheeses or blue cheese. Non-dairy creamers and whipped toppings. Processed cheese, cheese spreads, or cheese curds.  Condiments Onion and garlic salt, seasoned salt, table salt, and sea salt. Canned and packaged gravies. Worcestershire sauce. Tartar sauce. Barbecue sauce. Teriyaki sauce. Soy sauce, including  reduced sodium. Steak sauce. Fish sauce. Oyster sauce. Cocktail sauce. Horseradish. Ketchup and mustard. Meat flavorings and tenderizers. Bouillon cubes. Hot sauce. Tabasco sauce. Marinades. Taco seasonings. Relishes.  Fats and Oils Butter, stick margarine, lard, shortening and bacon fat. Coconut, palm kernel, or palm oils. Regular salad dressings.  Pickles and olives. Salted popcorn and pretzels.  The items listed above may not be a complete list of foods and beverages to avoid.

## 2018-03-23 NOTE — Progress Notes (Signed)
3 Month Follow Up   Assessment and Plan:   Cholesterol Continue current medications Currently at goal;  Continue low cholesterol diet and exercise.  Check lipid panel.   [name] [name] was seen today for follow-up.  Diagnoses and all orders for this visit:  Essential hypertension Well controlled with current medications  Blood pressure soft 110/70 Reports in 110-120's at home, asymptomatic Monitor blood pressure at home; patient to call if consistently greater than 140/90 or 90/60 or less. Continue DASH diet.   Reminder to go to the ER if any CP, SOB, nausea, dizziness, severe HA, changes vision/speech, left arm numbness and tingling and jaw pain. -     CBC with Differential/Platelet -     COMPLETE METABOLIC PANEL WITH GFR -     Magnesium  Hyperlipidemia, unspecified hyperlipidemia type Discussed dietary and exercise modifications -     Lipid panel  Iron deficiency anemia, unspecified iron deficiency anemia type Not taking supplement at this time, well controlled. 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  Abnormal Glucose Discussed dietary and exercise modifications Will check A1c and insulin  BMI 22.0-22.9, adult Discussed dietary and exercise modifications -     Hemoglobin A1c -     Insulin, random  Cystocele with prolapse Currently going to pelivc floor therapy for this Continue willowing with GYN / Urology for this  Seasonal allergies Doing well at this time Uses cetirizine PRN, continue with benefit  Vitamin D deficiency Taking supplementation, continue -     VITAMIN D 25 Hydroxy (Vit-D Deficiency, Fractures)  Osteopenia of left femoral neck -     VITAMIN D 25 Hydroxy (Vit-D Deficiency) Taking calcium and vit D Due for DEXA next year 2021  Medication management -     CBC with Differential/Platelet -     COMPLETE METABOLIC PANEL WITH  GFR -     Magnesium -     Lipid panel -     TSH -     Hemoglobin A1c -     Insulin, random -     VITAMIN D 25 Hydroxy (Vit-D Deficiency)    Continue diet and meds as discussed. Further disposition pending results of labs. Discussed med's effects and SE's.   Over 30 minutes of exam, counseling, chart review, and critical decision making was performed.   Future Appointments  Date Time Provider [place]  05/16/2018 10:45 AM Vicie Mutters, PA-C [facility] None  08/21/2018 10:30 AM Unk Pinto, MD [facility] None  12/26/2018  9:30 AM Regina Eck, CNM [place] None  01/05/2019 10:00 AM Unk Pinto, MD [facility] None    ----------------------------------------------------------------------------------------------------------------------  HPI 68 y.o. female  presents for 3 month follow up on HTN, HLD, Hypothyroidism, history of pre-diabetes, weight and vitamin D deficiency.   BMI is Body mass index is 23.13 kg/m., she has been working on diet and exercise. Wt Readings from Last 3 Encounters:  03/24/18 151 lb (68.5 kg)  12/21/17 148 lb (67.1 kg)  12/19/17 150 lb 3.2 oz (68.1 kg)    Her blood pressure has been controlled at home, today their BP is BP: 110/70  She does workout. She denies any cardiac symptoms, chest pains, palpitations, shortness of breath, dizziness or lower extremity edema.     She is not on cholesterol medication and denies myalgias. Her cholesterol is at goal. The cholesterol last visit was:   Lab Results  Component Value Date   CHOL 173  03/24/2018   HDL 56 03/24/2018   LDLCALC 96 03/24/2018   TRIG 112 03/24/2018   CHOLHDL 3.1 03/24/2018    She has been working on diet and exercise for prediabetes, and denies foot ulcerations, hyperglycemia, hypoglycemia , nausea, polydipsia and polyuria. Last A1C in the office was:  Lab Results  Component Value Date   HGBA1C 5.8 (H) 03/24/2018   Patient is on Vitamin D supplement.   Lab Results   Component Value Date   VD25OH 89 03/24/2018       Current Medications:  Current Outpatient Medications on File Prior to Visit  Medication Sig  . acetaminophen (TYLENOL) 325 MG tablet Take 2 tablets (650 mg total) by mouth every 6 (six) hours as needed for mild pain (or Fever >/= 101).  . bisoprolol-hydrochlorothiazide (ZIAC) 5-6.25 MG tablet TAKE 1 TABLET BY MOUTH ONCE DAILY  . calcium carbonate (OS-CAL) 600 MG TABS tablet Take 600 mg by mouth daily with breakfast.  . Cholecalciferol (VITAMIN D PO) Take 5,000 Units by mouth every morning.   . fish oil-omega-3 fatty acids 1000 MG capsule Take 1 g by mouth 3 (three) times daily with meals.   . Flaxseed, Linseed, (FLAXSEED OIL PO) Take 1 tablet by mouth 3 (three) times daily with meals.   . meloxicam (MOBIC) 15 MG tablet TAKE 1 TABLET BY MOUTH ONCE DAILY AS NEEDED FOR PAIN WITH FOOD  . Multiple Vitamin (MULTIVITAMIN) tablet Take 1 tablet by mouth every morning.    No current facility-administered medications on file prior to visit.     Allergies:  Allergies  Allergen Reactions  . Amoxil [Amoxicillin] Rash  . Ampicillin Rash     Medical History:  Past Medical History:  Diagnosis Date  . Abnormal Pap smear of cervix   . Acute lower GI bleeding 02/16/2016  . Heart murmur   . Hyperlipidemia   . Hypertension   . Prediabetes   . Seasonal allergies   . Vitamin D deficiency     Family history- Reviewed and unchanged   Social history- Reviewed and unchanged   Names of Other Physician/Practitioners you currently use: 1. [facility] here for primary care 2. Eye Exam 2020 3.Dentist UTD 2020, Q75months  Patient Care Team: Unk Pinto, MD as PCP - General (Internal Medicine)   Screening Tests: Immunization History  Administered Date(s) Administered  . Influenza Split 01/03/2013  . Influenza, High Dose Seasonal PF 10/23/2015, 11/08/2016, 11/25/2017  . PPD Test 07/13/2013  .  Pneumococcal Conjugate-13 01/08/2016  . Pneumococcal Polysaccharide-23 12/19/2017  . Pneumococcal-Unspecified 01/18/2002  . Tdap 01/19/2003, 07/05/2012  . Zoster 07/05/2012     Vaccinations: TD or Tdap: 2014  Influenza: 2019  Pneumococcal: 2019 Prevnar13: 2017 Shingles/Zostavax: 2014   Preventative Care: Last colonoscopy: 2018 Last mammogram: 01/2018 Last pap smear/pelvic exam: 12/2017 DEXA: 01/2016 T -1.2 osteopenia L Femur, DUE 2021  Imaging: Chest X-ray: 2019 EKG: 2019 ECHO: N/A    Review of Systems:  Review of Systems  Constitutional: Negative for chills, diaphoresis, fever, malaise/fatigue and weight loss.  HENT: Negative for congestion, ear discharge, ear pain, hearing loss, nosebleeds, sinus pain, sore throat and tinnitus.   Eyes: Negative for blurred vision, double vision, photophobia, pain, discharge and redness.  Respiratory: Negative for cough, hemoptysis, sputum production, shortness of breath, wheezing and stridor.   Cardiovascular: Negative for chest pain, palpitations, orthopnea, claudication, leg swelling and PND.  Gastrointestinal: Negative for abdominal pain, blood in stool, constipation, diarrhea, heartburn, melena, nausea and vomiting.  Genitourinary:  Negative for dysuria, flank pain, frequency, hematuria and urgency.  Musculoskeletal: Negative for back pain, falls, joint pain, myalgias and neck pain.  Skin: Negative for itching and rash.  Neurological: Negative for dizziness, tingling, tremors, sensory change, speech change, focal weakness, seizures, loss of consciousness, weakness and headaches.  Endo/Heme/Allergies: Negative for environmental allergies and polydipsia. Does not bruise/bleed easily.  Psychiatric/Behavioral: Negative for depression, hallucinations, memory loss, substance abuse and suicidal ideas. The patient is not nervous/anxious and does not have insomnia.       Physical Exam: BP 110/70   Pulse (!) 48   Temp (!) 97.3 F (36.3 C)    Ht 5' 7.75" (1.721 m)   Wt 151 lb (68.5 kg)   LMP 11/17/2002   SpO2 98%   BMI 23.13 kg/m  Wt Readings from Last 3 Encounters:  03/24/18 151 lb (68.5 kg)  12/21/17 148 lb (67.1 kg)  12/19/17 150 lb 3.2 oz (68.1 kg)   General Appearance: Well nourished, in no apparent distress. Eyes: PERRLA, EOMs, conjunctiva no swelling or erythema Sinuses: No Frontal/maxillary tenderness ENT/Mouth: Ext aud canals clear, TMs without erythema, bulging. No erythema, swelling, or exudate on post pharynx.  Tonsils not swollen or erythematous. Hearing normal.  Neck: Supple, thyroid normal.  Respiratory: Respiratory effort normal, BS equal bilaterally without rales, rhonchi, wheezing or stridor.  Cardio: RRR with no MRGs. Brisk peripheral pulses without edema.  Abdomen: Soft, + BS.  Non tender, no guarding, rebound, hernias, masses. Lymphatics: Non tender without lymphadenopathy.  Musculoskeletal: Full ROM, 5/5 strength, Normal gait Skin: Warm, dry without rashes, lesions, ecchymosis.  Neuro: Cranial nerves intact. No cerebellar symptoms.  Psych: Awake and oriented X 3, normal affect, Insight and Judgment appropriate.    Garnet Sierras, NP [facility] 11:46 AM

## 2018-03-24 ENCOUNTER — Ambulatory Visit (INDEPENDENT_AMBULATORY_CARE_PROVIDER_SITE_OTHER): Payer: Medicare HMO | Admitting: Adult Health Nurse Practitioner

## 2018-03-24 ENCOUNTER — Encounter: Payer: Self-pay | Admitting: Adult Health Nurse Practitioner

## 2018-03-24 VITALS — BP 110/70 | HR 48 | Temp 97.3°F | Ht 67.75 in | Wt 151.0 lb

## 2018-03-24 DIAGNOSIS — N814 Uterovaginal prolapse, unspecified: Secondary | ICD-10-CM

## 2018-03-24 DIAGNOSIS — E785 Hyperlipidemia, unspecified: Secondary | ICD-10-CM | POA: Diagnosis not present

## 2018-03-24 DIAGNOSIS — J302 Other seasonal allergic rhinitis: Secondary | ICD-10-CM

## 2018-03-24 DIAGNOSIS — E069 Thyroiditis, unspecified: Secondary | ICD-10-CM | POA: Diagnosis not present

## 2018-03-24 DIAGNOSIS — D509 Iron deficiency anemia, unspecified: Secondary | ICD-10-CM

## 2018-03-24 DIAGNOSIS — Z79899 Other long term (current) drug therapy: Secondary | ICD-10-CM | POA: Diagnosis not present

## 2018-03-24 DIAGNOSIS — M85852 Other specified disorders of bone density and structure, left thigh: Secondary | ICD-10-CM | POA: Diagnosis not present

## 2018-03-24 DIAGNOSIS — R7309 Other abnormal glucose: Secondary | ICD-10-CM | POA: Diagnosis not present

## 2018-03-24 DIAGNOSIS — Z6822 Body mass index (BMI) 22.0-22.9, adult: Secondary | ICD-10-CM

## 2018-03-24 DIAGNOSIS — E559 Vitamin D deficiency, unspecified: Secondary | ICD-10-CM

## 2018-03-24 DIAGNOSIS — Z8639 Personal history of other endocrine, nutritional and metabolic disease: Secondary | ICD-10-CM

## 2018-03-24 DIAGNOSIS — R001 Bradycardia, unspecified: Secondary | ICD-10-CM

## 2018-03-24 DIAGNOSIS — I10 Essential (primary) hypertension: Secondary | ICD-10-CM | POA: Diagnosis not present

## 2018-03-26 ENCOUNTER — Encounter: Payer: Self-pay | Admitting: Adult Health Nurse Practitioner

## 2018-03-26 DIAGNOSIS — Z8639 Personal history of other endocrine, nutritional and metabolic disease: Secondary | ICD-10-CM | POA: Insufficient documentation

## 2018-03-26 DIAGNOSIS — R001 Bradycardia, unspecified: Secondary | ICD-10-CM | POA: Insufficient documentation

## 2018-03-27 LAB — COMPLETE METABOLIC PANEL WITH GFR
AG Ratio: 1.6 (calc) (ref 1.0–2.5)
ALT: 15 U/L (ref 6–29)
AST: 20 U/L (ref 10–35)
Albumin: 4.4 g/dL (ref 3.6–5.1)
Alkaline phosphatase (APISO): 43 U/L (ref 37–153)
BUN: 16 mg/dL (ref 7–25)
CO2: 31 mmol/L (ref 20–32)
Calcium: 10 mg/dL (ref 8.6–10.4)
Chloride: 101 mmol/L (ref 98–110)
Creat: 0.73 mg/dL (ref 0.50–0.99)
GFR, Est African American: 99 mL/min/{1.73_m2} (ref >=60)
GFR, Est Non African American: 85 mL/min/{1.73_m2} (ref >=60)
Globulin: 2.7 g/dL (calc) (ref 1.9–3.7)
Glucose, Bld: 91 mg/dL (ref 65–99)
Potassium: 4.3 mmol/L (ref 3.5–5.3)
Sodium: 140 mmol/L (ref 135–146)
Total Bilirubin: 0.5 mg/dL (ref 0.2–1.2)
Total Protein: 7.1 g/dL (ref 6.1–8.1)

## 2018-03-27 LAB — CBC WITH DIFFERENTIAL/PLATELET
Absolute Monocytes: 435 cells/uL (ref 200–950)
Basophils Absolute: 32 cells/uL (ref 0–200)
Basophils Relative: 0.6 %
Eosinophils Absolute: 90 cells/uL (ref 15–500)
Eosinophils Relative: 1.7 %
HCT: 38.6 % (ref 35.0–45.0)
Hemoglobin: 13 g/dL (ref 11.7–15.5)
Lymphs Abs: 1638 cells/uL (ref 850–3900)
MCH: 30.5 pg (ref 27.0–33.0)
MCHC: 33.7 g/dL (ref 32.0–36.0)
MCV: 90.6 fL (ref 80.0–100.0)
MPV: 11.7 fL (ref 7.5–12.5)
Monocytes Relative: 8.2 %
Neutro Abs: 3106 cells/uL (ref 1500–7800)
Neutrophils Relative %: 58.6 %
Platelets: 240 10*3/uL (ref 140–400)
RBC: 4.26 10*6/uL (ref 3.80–5.10)
RDW: 12.4 % (ref 11.0–15.0)
Total Lymphocyte: 30.9 %
WBC: 5.3 10*3/uL (ref 3.8–10.8)

## 2018-03-27 LAB — HEMOGLOBIN A1C
Hgb A1c MFr Bld: 5.8 % of total Hgb — ABNORMAL HIGH (ref <5.7)
Mean Plasma Glucose: 120 (calc)
eAG (mmol/L): 6.6 (calc)

## 2018-03-27 LAB — MAGNESIUM: Magnesium: 1.8 mg/dL (ref 1.5–2.5)

## 2018-03-27 LAB — LIPID PANEL
Cholesterol: 173 mg/dL (ref <200)
HDL: 56 mg/dL (ref >=50)
LDL Cholesterol (Calc): 96 mg/dL (calc)
Non-HDL Cholesterol (Calc): 117 mg/dL (calc) (ref <130)
Total CHOL/HDL Ratio: 3.1 (calc) (ref <5.0)
Triglycerides: 112 mg/dL (ref <150)

## 2018-03-27 LAB — TSH: TSH: 3.22 mIU/L (ref 0.40–4.50)

## 2018-03-27 LAB — INSULIN, RANDOM: Insulin: 5.8 u[IU]/mL

## 2018-03-27 LAB — VITAMIN D 25 HYDROXY (VIT D DEFICIENCY, FRACTURES): Vit D, 25-Hydroxy: 89 ng/mL (ref 30–100)

## 2018-04-06 ENCOUNTER — Other Ambulatory Visit: Payer: Self-pay | Admitting: Internal Medicine

## 2018-05-15 NOTE — Progress Notes (Addendum)
MEDICARE ANNUAL WELLNESS VISIT AND FOLLOW UP  THIS ENCOUNTER IS A VIRTUAL/TELEVIDEO VISIT DUE TO COVID-19 - PATIENT WAS NOT SEEN IN THE OFFICE.  PATIENT HAS CONSENTED TO VIRTUAL VISIT / TELEVIDEO VISIT  This provider placed a call to [name], her appointment was changed to a virtual office visit to reduce the risk of exposure to the COVID-19 virus and to help [place] remain healthy and safe. The virtual visit will also provide continuity of care. She verbalizes understanding.    Assessment:   BMI 22.0-22.9, adult Monitor  Essential hypertension - continue medications, DASH diet, exercise and monitor at home. Call if greater than 130/80.   Hyperlipidemia, unspecified hyperlipidemia type -continue medications, check lipids, decrease fatty foods, increase activity.   Iron deficiency anemia, unspecified iron deficiency anemia type Monitor CBC  Medication management  Vitamin D deficiency Continue supplement  Seasonal allergies Continue allergy pill  History of colonic polyps UTD  Encounter for Medicare annual wellness exam 1 year  Sinus bradycardia On BB however patient is not having any symptoms and wishes to remain on the medication at this time, discussed a long time about switching, she will monitor her heart rate and message or call if she has any symptoms.   Over 30 minutes of exam, counseling, chart review and critical decision making was performed Future Appointments  Date Time Provider [place]  08/21/2018 10:30 AM Unk Pinto, MD [facility] None  12/26/2018  9:30 AM Regina Eck, CNM [place] None  01/05/2019 10:00 AM Unk Pinto, MD [facility] None     Plan:   During the course of the visit the patient was educated and counseled about appropriate screening and preventive services including:    Pneumococcal vaccine   Prevnar 13  Influenza vaccine  Td vaccine  Screening electrocardiogram  Bone densitometry  screening  Colorectal cancer screening  Diabetes screening  Glaucoma screening  Nutrition counseling   Advanced directives: requested   Subjective:  Borghild Thaker is a 68 y.o. female who presents for Medicare Annual Wellness Visit and 3 month follow up.   She has a history of bradycardia, no symptoms, wishes to stay on the ziac for now, will monitor for symptoms and check her heart rate after she walks to see if it increases.    Her blood pressure has been controlled at home, today their BP is BP: 129/61 She does workout. She denies chest pain, shortness of breath, dizziness.   BMI is Body mass index is 22.52 kg/m., she is working on diet and exercise. Wt Readings from Last 3 Encounters:  05/16/18 147 lb (66.7 kg)  03/24/18 151 lb (68.5 kg)  12/21/17 148 lb (67.1 kg)   She is on cholesterol medication and denies myalgias. Her cholesterol is at goal. The cholesterol last visit was:   Lab Results  Component Value Date   CHOL 173 03/24/2018   HDL 56 03/24/2018   LDLCALC 96 03/24/2018   TRIG 112 03/24/2018   CHOLHDL 3.1 03/24/2018    Last A1C in the office was:  Lab Results  Component Value Date   HGBA1C 5.8 (H) 03/24/2018   Last GFR: Lab Results  Component Value Date   GFRNONAA 85 03/24/2018   Patient is on Vitamin D supplement.   Lab Results  Component Value Date   VD25OH 89 03/24/2018      Medication Review:   Current Outpatient Medications (Cardiovascular):  .  bisoprolol-hydrochlorothiazide (ZIAC) 5-6.25 MG tablet, Take 1 tablet by mouth once daily  Current Outpatient Medications (Analgesics):  .  acetaminophen (TYLENOL) 325 MG tablet, Take 2 tablets (650 mg total) by mouth every 6 (six) hours as needed for mild pain (or Fever >/= 101). .  meloxicam (MOBIC) 15 MG tablet, TAKE 1 TABLET BY MOUTH ONCE DAILY AS NEEDED FOR PAIN WITH FOOD   Current Outpatient Medications (Other):  .  calcium carbonate (OS-CAL) 600 MG TABS tablet, Take 600 mg by mouth  daily with breakfast. .  Cholecalciferol (VITAMIN D PO), Take 5,000 Units by mouth every morning.  .  fish oil-omega-3 fatty acids 1000 MG capsule, Take 1 g by mouth 3 (three) times daily with meals.  .  Flaxseed, Linseed, (FLAXSEED OIL PO), Take 1 tablet by mouth 3 (three) times daily with meals.  .  Multiple Vitamin (MULTIVITAMIN) tablet, Take 1 tablet by mouth every morning.   Allergies Allergies  Allergen Reactions  . Amoxil [Amoxicillin] Rash  . Ampicillin Rash    Current Problems (verified) Patient Active Problem List   Diagnosis Date Noted  . History of thyroiditis 03/26/2018  . Bradycardia with 41-50 beats per minute 03/26/2018  . Osteopenia of left femoral neck 03/23/2018  . Cystocele with prolapse 03/23/2018  . Thyroiditis 07/22/2017  . Low TSH level 07/05/2017  . BMI 22.0-22.9, adult 06/01/2017  . Anemia, iron deficiency 10/16/2015  . Medication management 07/26/2014  . Hyperlipidemia   . Seasonal allergies   . Vitamin D deficiency   . Hypertension   . History of colonic polyps 03/23/2011    Screening Tests Immunization History  Administered Date(s) Administered  . Influenza Split 01/03/2013  . Influenza, High Dose Seasonal PF 10/23/2015, 11/08/2016, 11/25/2017  . PPD Test 07/13/2013  . Pneumococcal Conjugate-13 01/08/2016  . Pneumococcal Polysaccharide-23 12/19/2017  . Pneumococcal-Unspecified 01/18/2002  . Tdap 01/19/2003, 07/05/2012  . Zoster 07/05/2012   Preventative care: Last colonoscopy: 01/2016 Last mammogram: 01/2018 Last pap smear/pelvic exam: 2015 DEXA: 01/2016  Prior vaccinations: TD or Tdap: 2014  Influenza: 2019 Pneumococcal: 2019 Prevnar13: 2017 Shingles/Zostavax: 2014 Shingrax- has not had yet- discussed  Names of Other Physician/Practitioners you currently use: 1. [facility] here for primary care Patient Care Team: Unk Pinto, MD as PCP - General (Internal Medicine)  SURGICAL  HISTORY She  has a past surgical history that includes Cardiac surgery (1958); Colonoscopy; Cervix lesion destruction (N/A, ? years ago); Tubal ligation; Colonoscopy (N/A, 02/16/2016); and Cryotherapy. FAMILY HISTORY Her family history includes Colon cancer (age of onset: 79) in her sister; Diabetes in her brother, father, sister, and sister. SOCIAL HISTORY She  reports that she has never smoked. She has never used smokeless tobacco. She reports current alcohol use of about 7.0 standard drinks of alcohol per week. She reports that she does not use drugs.  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 N N  Vision? N N  Difficulty concentrating or making decisions? N N  Walking or climbing stairs? N N  Dressing or bathing? N N  Doing errands, shopping? N N  Some recent data might be hidden  Cognitive Testing  Alert? Yes  Normal Appearance?Yes  Oriented to person? Yes  Place? Yes   Time? Yes  Recall of three objects?  Yes  Can perform simple calculations? Yes  Displays appropriate judgment?Yes  Can read the correct time from a watch face?Yes  EOL planning: Does Patient Have a Medical Advance Directive?: Yes Type of Advance Directive: Healthcare Power of Attorney, Living will Copy of [place] in Chart?: No - copy requested  Review of Systems  Constitutional: Negative.   HENT: Negative.   Eyes: Negative.   Respiratory: Negative.   Cardiovascular: Negative.    Gastrointestinal: Negative.   Genitourinary: Negative.   Musculoskeletal: Negative.   Skin: Negative.   Neurological: Negative.   Endo/Heme/Allergies: Negative.   Psychiatric/Behavioral: Negative.      Objective:     Today's Vitals   05/16/18 1027  BP: 129/61  Pulse: (!) 49  Weight: 147 lb (66.7 kg)  Height: 5' 7.75" (1.721 m)   Body mass index is 22.52 kg/m.  General Appearance:Well sounding, in no apparent distress.  ENT/Mouth: No hoarseness, No cough for duration of visit.  Respiratory: completing full sentences without distress, without audible wheeze Neuro: Awake and oriented X 3,  Psych:  Insight and Judgment appropriate.    Medicare Attestation I have personally reviewed: The patient's medical and social history Their use of alcohol, tobacco or illicit drugs Their current medications and supplements The patient's functional ability including ADLs,fall risks, home safety risks, cognitive, and hearing and visual impairment Diet and physical activities Evidence for depression or mood disorders  The patient's weight, height, BMI, and visual acuity have been recorded in the chart.  I have made referrals, counseling, and provided education to the patient based on review of the above and I have provided the patient with a written personalized care plan for preventive services.     Vicie Mutters, PA-C   05/16/2018

## 2018-05-16 ENCOUNTER — Encounter: Payer: Self-pay | Admitting: Physician Assistant

## 2018-05-16 ENCOUNTER — Other Ambulatory Visit: Payer: Self-pay

## 2018-05-16 ENCOUNTER — Ambulatory Visit: Payer: Medicare HMO | Admitting: Physician Assistant

## 2018-05-16 ENCOUNTER — Other Ambulatory Visit: Payer: Self-pay | Admitting: Physician Assistant

## 2018-05-16 VITALS — BP 129/61 | HR 49 | Ht 67.75 in | Wt 147.0 lb

## 2018-05-16 DIAGNOSIS — Z79899 Other long term (current) drug therapy: Secondary | ICD-10-CM

## 2018-05-16 DIAGNOSIS — Z860100 Personal history of colon polyps, unspecified: Secondary | ICD-10-CM

## 2018-05-16 DIAGNOSIS — E069 Thyroiditis, unspecified: Secondary | ICD-10-CM | POA: Diagnosis not present

## 2018-05-16 DIAGNOSIS — R7989 Other specified abnormal findings of blood chemistry: Secondary | ICD-10-CM | POA: Diagnosis not present

## 2018-05-16 DIAGNOSIS — N814 Uterovaginal prolapse, unspecified: Secondary | ICD-10-CM

## 2018-05-16 DIAGNOSIS — Z6822 Body mass index (BMI) 22.0-22.9, adult: Secondary | ICD-10-CM | POA: Diagnosis not present

## 2018-05-16 DIAGNOSIS — J302 Other seasonal allergic rhinitis: Secondary | ICD-10-CM

## 2018-05-16 DIAGNOSIS — E559 Vitamin D deficiency, unspecified: Secondary | ICD-10-CM | POA: Diagnosis not present

## 2018-05-16 DIAGNOSIS — Z8639 Personal history of other endocrine, nutritional and metabolic disease: Secondary | ICD-10-CM | POA: Diagnosis not present

## 2018-05-16 DIAGNOSIS — M85852 Other specified disorders of bone density and structure, left thigh: Secondary | ICD-10-CM | POA: Diagnosis not present

## 2018-05-16 DIAGNOSIS — R001 Bradycardia, unspecified: Secondary | ICD-10-CM

## 2018-05-16 DIAGNOSIS — Z0001 Encounter for general adult medical examination with abnormal findings: Secondary | ICD-10-CM

## 2018-05-16 DIAGNOSIS — D509 Iron deficiency anemia, unspecified: Secondary | ICD-10-CM

## 2018-05-16 DIAGNOSIS — R6889 Other general symptoms and signs: Secondary | ICD-10-CM

## 2018-05-16 DIAGNOSIS — E785 Hyperlipidemia, unspecified: Secondary | ICD-10-CM

## 2018-05-16 DIAGNOSIS — Z0000 Encounter for general adult medical examination without abnormal findings: Secondary | ICD-10-CM

## 2018-05-16 DIAGNOSIS — I10 Essential (primary) hypertension: Secondary | ICD-10-CM

## 2018-05-16 MED ORDER — AMLODIPINE BESYLATE 2.5 MG PO TABS: 2.5000 mg | ORAL_TABLET | Freq: Every day | ORAL | 2 refills | Status: DC

## 2018-06-09 DIAGNOSIS — H40053 Ocular hypertension, bilateral: Secondary | ICD-10-CM | POA: Diagnosis not present

## 2018-07-03 ENCOUNTER — Ambulatory Visit: Payer: Self-pay | Admitting: Internal Medicine

## 2018-08-17 DIAGNOSIS — R69 Illness, unspecified: Secondary | ICD-10-CM | POA: Diagnosis not present

## 2018-08-20 ENCOUNTER — Encounter: Payer: Self-pay | Admitting: Internal Medicine

## 2018-08-20 DIAGNOSIS — R7309 Other abnormal glucose: Secondary | ICD-10-CM | POA: Insufficient documentation

## 2018-08-20 NOTE — Patient Instructions (Signed)

## 2018-08-20 NOTE — Progress Notes (Signed)
History of Present Illness:      This very nice 68 y.o. MWF presents for 6 month follow up with HTN, HLD, Pre-Diabetes and Vitamin D Deficiency. Patient reports occasional sharp fleeting pains in the Rt groin . No particular aggravating factors and denies bulges in groin.       Patient is treated for HTN  (2001) & BP has been controlled at home. Today's BP is at goal - 126/72. Patient has had no complaints of any cardiac type chest pain, palpitations, dyspnea / orthopnea / PND, dizziness, claudication, or dependent edema.      Hyperlipidemia is controlled with diet & meds. Patient denies myalgias or other med SE's. Last Lipids were at goal: Lab Results  Component Value Date   CHOL 173 03/24/2018   HDL 56 03/24/2018   LDLCALC 96 03/24/2018   TRIG 112 03/24/2018   CHOLHDL 3.1 03/24/2018       Also, the patient has history of PreDiabetes  (A1c 6.0% / 2014  & 5.9% / 2017)  and has had no symptoms of reactive hypoglycemia, diabetic polys, paresthesias or visual blurring.  Last A1c was not at goal: Lab Results  Component Value Date   HGBA1C 5.8 (H) 03/24/2018       Further, the patient also has history of Vitamin D Deficiency ("24" / 2008)  and supplements vitamin D without any suspected side-effects. Last vitamin D was at goal: Lab Results  Component Value Date   VD25OH 89 03/24/2018   Current Outpatient Medications on File Prior to Visit  Medication Sig  . acetaminophen (TYLENOL) 325 MG tablet Take 2 tablets (650 mg total) by mouth every 6 (six) hours as needed for mild pain (or Fever >/= 101).  Marland Kitchen amLODipine (NORVASC) 2.5 MG tablet Take 1 tablet (2.5 mg total) by mouth daily.  . calcium carbonate (OS-CAL) 600 MG TABS tablet Take 600 mg by mouth daily with breakfast.  . Cholecalciferol (VITAMIN D PO) Take 5,000 Units by mouth every morning.   . fish oil-omega-3 fatty acids 1000 MG capsule Take 1 g by mouth 3 (three) times daily with meals.   . Flaxseed, Linseed, (FLAXSEED OIL PO)  Take 1 tablet by mouth 3 (three) times daily with meals.   . meloxicam (MOBIC) 15 MG tablet TAKE 1 TABLET BY MOUTH ONCE DAILY AS NEEDED FOR PAIN WITH FOOD  . Multiple Vitamin (MULTIVITAMIN) tablet Take 1 tablet by mouth every morning.    No current facility-administered medications on file prior to visit.    Allergies  Allergen Reactions  . Amoxil [Amoxicillin] Rash  . Ampicillin Rash   PMHx:   Past Medical History:  Diagnosis Date  . Abnormal Pap smear of cervix   . Acute lower GI bleeding 02/16/2016  . Heart murmur   . Hyperlipidemia   . Hypertension   . Prediabetes   . Seasonal allergies   . Vitamin D deficiency    Immunization History  Administered Date(s) Administered  . Influenza Split 01/03/2013  . Influenza, High Dose Seasonal PF 10/23/2015, 11/08/2016, 11/25/2017  . PPD Test 07/13/2013  . Pneumococcal Conjugate-13 01/08/2016  . Pneumococcal Polysaccharide-23 12/19/2017  . Pneumococcal-Unspecified 01/18/2002  . Tdap 01/19/2003, 07/05/2012  . Zoster 07/05/2012   Past Surgical History:  Procedure Laterality Date  . CARDIAC SURGERY  1958   at age 31 for hole in heart  . CERVIX LESION DESTRUCTION N/A ? years ago   with abnormal pap  . COLONOSCOPY  polyps  . COLONOSCOPY N/A 02/16/2016   Procedure: COLONOSCOPY;  Surgeon: Ladene Artist, MD;  Location: [facility];  Service: Endoscopy;  Laterality: N/A;  . CRYOTHERAPY     for abnormal pap smear  . TUBAL LIGATION     FHx:    Reviewed / unchanged  SHx:    Reviewed / unchanged   Systems Review:  Constitutional: Denies fever, chills, wt changes, headaches, insomnia, fatigue, night sweats, change in appetite. Eyes: Denies redness, blurred vision, diplopia, discharge, itchy, watery eyes.  ENT: Denies discharge, congestion, post nasal drip, epistaxis, sore throat, earache, hearing loss, dental pain, tinnitus, vertigo, sinus pain, snoring.  CV: Denies chest pain, palpitations, irregular heartbeat, syncope, dyspnea,  diaphoresis, orthopnea, PND, claudication or edema. Respiratory: denies cough, dyspnea, DOE, pleurisy, hoarseness, laryngitis, wheezing.  Gastrointestinal: Denies dysphagia, odynophagia, heartburn, reflux, water brash, abdominal pain or cramps, nausea, vomiting, bloating, diarrhea, constipation, hematemesis, melena, hematochezia  or hemorrhoids. Genitourinary: Denies dysuria, frequency, urgency, nocturia, hesitancy, discharge, hematuria or flank pain. Musculoskeletal: Denies arthralgias, myalgias, stiffness, jt. swelling, pain, limping or strain/sprain.  Skin: Denies pruritus, rash, hives, warts, acne, eczema or change in skin lesion(s). Neuro: No weakness, tremor, incoordination, spasms, paresthesia or pain. Psychiatric: Denies confusion, memory loss or sensory loss. Endo: Denies change in weight, skin or hair change.  Heme/Lymph: No excessive bleeding, bruising or enlarged lymph nodes.  Physical Exam  BP 126/72   Pulse 64   Temp 97.7 F (36.5 C)   Resp 16   Ht 5\' 8"  (1.727 m)   Wt 144 lb 9.6 oz (65.6 kg)   LMP 11/17/2002   BMI 21.99 kg/m   Appears  well nourished, well groomed  and in no distress.  Eyes: PERRLA, EOMs, conjunctiva no swelling or erythema. Sinuses: No frontal/maxillary tenderness ENT/Mouth: EAC's clear, TM's nl w/o erythema, bulging. Nares clear w/o erythema, swelling, exudates. Oropharynx clear without erythema or exudates. Oral hygiene is good. Tongue normal, non obstructing. Hearing intact.  Neck: Supple. Thyroid not palpable. Car 2+/2+ without bruits, nodes or JVD. Chest: Respirations nl with BS clear & equal w/o rales, rhonchi, wheezing or stridor.  Cor: Heart sounds normal w/ regular rate and rhythm without sig. murmurs, gallops, clicks or rubs. Peripheral pulses normal and equal  without edema.  Abdomen: Soft & bowel sounds normal. Non-tender w/o guarding, rebound, hernias, masses or organomegaly.  Lymphatics: Unremarkable.  Musculoskeletal: Full ROM all  peripheral extremities, joint stability, 5/5 strength and normal gait. Normal Rt hip ROM & figure - 4.  Skin: Warm, dry without exposed rashes, lesions or ecchymosis apparent.  Neuro: Cranial nerves intact, reflexes equal bilaterally. Sensory-motor testing grossly intact. Tendon reflexes grossly intact.  Pysch: Alert & oriented x 3.  Insight and judgement nl & appropriate. No ideations.  Assessment and Plan:  1. Essential hypertension  - Continue medication, monitor blood pressure at home.  - Continue DASH diet.  Reminder to go to the ER if any CP,  SOB, nausea, dizziness, severe HA, changes vision/speech.  - CBC with Differential/Platelet - COMPLETE METABOLIC PANEL WITH GFR - Magnesium - TSH  2. Hyperlipidemia, mixed  - Continue diet/meds, exercise,& lifestyle modifications.  - Continue monitor periodic cholesterol/liver & renal functions   - Lipid panel - TSH  3. Abnormal glucose  - Continue diet, exercise  - Lifestyle modifications.  - Monitor appropriate labs.  - Hemoglobin A1c - Insulin, random  4. Vitamin D deficiency  - Continue supplementation.  - VITAMIN D 25 Hydroxyl  5. Prediabetes  - Hemoglobin A1c -  Insulin, random  6. Medication management  - CBC with Differential/Platelet - COMPLETE METABOLIC PANEL WITH GFR - Magnesium - Lipid panel - TSH - Hemoglobin A1c - Insulin, random - VITAMIN D 25 Hydroxyl       Discussed  regular exercise, BP monitoring, weight control to achieve/maintain BMI less than 25 and discussed med and SE's. Recommended labs to assess and monitor clinical status with further disposition pending results of labs.  I discussed the assessment and treatment plan with the patient. The patient was provided an opportunity to ask questions and all were answered. The patient agreed with the plan and demonstrated an understanding of the instructions.  I provided over 30 minutes of exam, counseling, chart review and  complex critical decision  making.   Kirtland Bouchard, MD

## 2018-08-21 ENCOUNTER — Other Ambulatory Visit: Payer: Self-pay

## 2018-08-21 ENCOUNTER — Ambulatory Visit (INDEPENDENT_AMBULATORY_CARE_PROVIDER_SITE_OTHER): Payer: Medicare HMO | Admitting: Internal Medicine

## 2018-08-21 VITALS — BP 126/72 | HR 64 | Temp 97.7°F | Resp 16 | Ht 68.0 in | Wt 144.6 lb

## 2018-08-21 DIAGNOSIS — E559 Vitamin D deficiency, unspecified: Secondary | ICD-10-CM | POA: Diagnosis not present

## 2018-08-21 DIAGNOSIS — Z79899 Other long term (current) drug therapy: Secondary | ICD-10-CM

## 2018-08-21 DIAGNOSIS — R7309 Other abnormal glucose: Secondary | ICD-10-CM

## 2018-08-21 DIAGNOSIS — I10 Essential (primary) hypertension: Secondary | ICD-10-CM

## 2018-08-21 DIAGNOSIS — R7303 Prediabetes: Secondary | ICD-10-CM | POA: Diagnosis not present

## 2018-08-21 DIAGNOSIS — E782 Mixed hyperlipidemia: Secondary | ICD-10-CM | POA: Diagnosis not present

## 2018-08-22 LAB — MAGNESIUM: Magnesium: 2 mg/dL (ref 1.5–2.5)

## 2018-08-22 LAB — HEMOGLOBIN A1C
Hgb A1c MFr Bld: 5.6 % of total Hgb (ref <5.7)
Mean Plasma Glucose: 114 (calc)
eAG (mmol/L): 6.3 (calc)

## 2018-08-22 LAB — CBC WITH DIFFERENTIAL/PLATELET
Absolute Monocytes: 298 cells/uL (ref 200–950)
Basophils Absolute: 29 cells/uL (ref 0–200)
Basophils Relative: 0.7 %
Eosinophils Absolute: 71 cells/uL (ref 15–500)
Eosinophils Relative: 1.7 %
HCT: 38.8 % (ref 35.0–45.0)
Hemoglobin: 12.9 g/dL (ref 11.7–15.5)
Lymphs Abs: 1499 cells/uL (ref 850–3900)
MCH: 30.4 pg (ref 27.0–33.0)
MCHC: 33.2 g/dL (ref 32.0–36.0)
MCV: 91.5 fL (ref 80.0–100.0)
MPV: 11.3 fL (ref 7.5–12.5)
Monocytes Relative: 7.1 %
Neutro Abs: 2302 cells/uL (ref 1500–7800)
Neutrophils Relative %: 54.8 %
Platelets: 254 10*3/uL (ref 140–400)
RBC: 4.24 10*6/uL (ref 3.80–5.10)
RDW: 12 % (ref 11.0–15.0)
Total Lymphocyte: 35.7 %
WBC: 4.2 10*3/uL (ref 3.8–10.8)

## 2018-08-22 LAB — LIPID PANEL
Cholesterol: 184 mg/dL (ref <200)
HDL: 64 mg/dL (ref >=50)
LDL Cholesterol (Calc): 99 mg/dL (calc)
Non-HDL Cholesterol (Calc): 120 mg/dL (calc) (ref <130)
Total CHOL/HDL Ratio: 2.9 (calc) (ref <5.0)
Triglycerides: 111 mg/dL (ref <150)

## 2018-08-22 LAB — COMPLETE METABOLIC PANEL WITH GFR
AG Ratio: 1.7 (calc) (ref 1.0–2.5)
ALT: 17 U/L (ref 6–29)
AST: 21 U/L (ref 10–35)
Albumin: 4.7 g/dL (ref 3.6–5.1)
Alkaline phosphatase (APISO): 52 U/L (ref 37–153)
BUN: 14 mg/dL (ref 7–25)
CO2: 29 mmol/L (ref 20–32)
Calcium: 10 mg/dL (ref 8.6–10.4)
Chloride: 104 mmol/L (ref 98–110)
Creat: 0.55 mg/dL (ref 0.50–0.99)
GFR, Est African American: 112 mL/min/{1.73_m2} (ref >=60)
GFR, Est Non African American: 97 mL/min/{1.73_m2} (ref >=60)
Globulin: 2.7 g/dL (calc) (ref 1.9–3.7)
Glucose, Bld: 91 mg/dL (ref 65–99)
Potassium: 4.1 mmol/L (ref 3.5–5.3)
Sodium: 142 mmol/L (ref 135–146)
Total Bilirubin: 0.8 mg/dL (ref 0.2–1.2)
Total Protein: 7.4 g/dL (ref 6.1–8.1)

## 2018-08-22 LAB — INSULIN, RANDOM: Insulin: 5.9 u[IU]/mL

## 2018-08-22 LAB — TSH: TSH: 3.26 mIU/L (ref 0.40–4.50)

## 2018-08-22 LAB — VITAMIN D 25 HYDROXY (VIT D DEFICIENCY, FRACTURES): Vit D, 25-Hydroxy: 100 ng/mL (ref 30–100)

## 2018-08-28 ENCOUNTER — Other Ambulatory Visit: Payer: Self-pay | Admitting: Internal Medicine

## 2018-10-04 ENCOUNTER — Other Ambulatory Visit: Payer: Self-pay

## 2018-10-04 ENCOUNTER — Ambulatory Visit (INDEPENDENT_AMBULATORY_CARE_PROVIDER_SITE_OTHER): Payer: Medicare HMO

## 2018-10-04 VITALS — Temp 96.4°F

## 2018-10-04 DIAGNOSIS — Z23 Encounter for immunization: Secondary | ICD-10-CM | POA: Diagnosis not present

## 2018-10-04 NOTE — Progress Notes (Signed)
Patient presents to the office for HD Flu Vaccine. Vaccine administered torightDeltoid withoutanycomplication. Temperature taken and recorded

## 2018-10-26 ENCOUNTER — Telehealth: Payer: Self-pay | Admitting: *Deleted

## 2018-10-26 NOTE — Telephone Encounter (Signed)
Patient called and reported her BP at 9:00 this morning was 118/49 and also reported she is seeing floaters In her eyes. Per Dr Melford Aase, the patient was informed to take her BP sitting and then standing.  If the systolic drops 20 points, she should cut her Amlodipine in 1/2. The patient reported at 11:00, her BP was 133/66, but occasionally sees the floaters. She will continue to monitor her BP and will call back if needed.  She was advised to contact her eye doctor, regarding the floaters, per Dr Melford Aase.

## 2018-11-21 ENCOUNTER — Ambulatory Visit: Payer: Medicare HMO | Admitting: Adult Health Nurse Practitioner

## 2018-11-21 ENCOUNTER — Other Ambulatory Visit: Payer: Self-pay

## 2018-11-21 ENCOUNTER — Ambulatory Visit (INDEPENDENT_AMBULATORY_CARE_PROVIDER_SITE_OTHER): Payer: Medicare HMO | Admitting: Adult Health Nurse Practitioner

## 2018-11-21 ENCOUNTER — Encounter: Payer: Self-pay | Admitting: Adult Health Nurse Practitioner

## 2018-11-21 VITALS — BP 122/80 | HR 75 | Temp 97.7°F | Wt 148.2 lb

## 2018-11-21 DIAGNOSIS — Z8639 Personal history of other endocrine, nutritional and metabolic disease: Secondary | ICD-10-CM | POA: Diagnosis not present

## 2018-11-21 DIAGNOSIS — E782 Mixed hyperlipidemia: Secondary | ICD-10-CM

## 2018-11-21 DIAGNOSIS — J302 Other seasonal allergic rhinitis: Secondary | ICD-10-CM

## 2018-11-21 DIAGNOSIS — R7309 Other abnormal glucose: Secondary | ICD-10-CM | POA: Diagnosis not present

## 2018-11-21 DIAGNOSIS — R7989 Other specified abnormal findings of blood chemistry: Secondary | ICD-10-CM | POA: Diagnosis not present

## 2018-11-21 DIAGNOSIS — N814 Uterovaginal prolapse, unspecified: Secondary | ICD-10-CM | POA: Diagnosis not present

## 2018-11-21 DIAGNOSIS — D509 Iron deficiency anemia, unspecified: Secondary | ICD-10-CM

## 2018-11-21 DIAGNOSIS — Z6822 Body mass index (BMI) 22.0-22.9, adult: Secondary | ICD-10-CM

## 2018-11-21 DIAGNOSIS — I10 Essential (primary) hypertension: Secondary | ICD-10-CM

## 2018-11-21 DIAGNOSIS — M85852 Other specified disorders of bone density and structure, left thigh: Secondary | ICD-10-CM | POA: Diagnosis not present

## 2018-11-21 DIAGNOSIS — Z79899 Other long term (current) drug therapy: Secondary | ICD-10-CM

## 2018-11-21 DIAGNOSIS — E559 Vitamin D deficiency, unspecified: Secondary | ICD-10-CM | POA: Diagnosis not present

## 2018-11-21 NOTE — Patient Instructions (Signed)
Vit D  & Vit C 1,000 mg   are recommended to help protect  against the Covid-19 and other Corona viruses.    Also it's recommended  to take  Zinc 50 mg  to help  protect against the Covid-19   and best place to get  is also on [employer]  and don't pay more than 6-8 cents /pill !  ================================ Coronavirus (COVID-19) Are you at risk?  Are you at risk for the Coronavirus (COVID-19)?  To be considered HIGH RISK for Coronavirus (COVID-19), you have to meet the following criteria:  . Traveled to [place], [place], [place], [place] or [place]; or in the [place] to [place], [place], [place]  . or [place]; and have fever, cough, and shortness of breath within the last 2 weeks of travel OR . Been in close contact with a person diagnosed with COVID-19 within the last 2 weeks and have  . fever, cough,and shortness of breath .  . IF YOU DO NOT MEET THESE CRITERIA, YOU ARE CONSIDERED LOW RISK FOR COVID-19.  What to do if you are HIGH RISK for COVID-19?  . If you are having a medical emergency, call 911. . Seek medical care right away. Before you go to a doctor's office, urgent care or emergency department, .  call ahead and tell them about your recent travel, contact with someone diagnosed with COVID-19  .  and your symptoms.  . You should receive instructions from your physician's office regarding next steps of care.  . When you arrive at healthcare provider, tell the healthcare staff immediately you have returned from  . visiting [place], [place], [place], [place] or [place]; or traveled in the [place] to [place], [place],  . [place] or [place] in the last two weeks or you have been in close contact with a person diagnosed with  . COVID-19 in the last 2 weeks.   . Tell the health care staff about your symptoms: fever, cough and shortness of breath. . After you have been seen by a medical provider, you will be either: o Tested for (COVID-19) and  discharged home on quarantine except to seek medical care if  o symptoms worsen, and asked to  - Stay home and avoid contact with others until you get your results (4-5 days)  - Avoid travel on public transportation if possible (such as bus, train, or airplane) or o Sent to the Emergency Department by EMS for evaluation, COVID-19 testing  and  o possible admission depending on your condition and test results.  What to do if you are LOW RISK for COVID-19?  Reduce your risk of any infection by using the same precautions used for avoiding the common cold or flu:  . Wash your hands often with soap and warm water for at least 20 seconds.  If soap and water are not readily available,  . use an alcohol-based hand sanitizer with at least 60% alcohol.  . If coughing or sneezing, cover your mouth and nose by coughing or sneezing into the elbow areas of your shirt or coat, .  into a tissue or into your sleeve (not your hands). . Avoid shaking hands with others and consider head nods or verbal greetings only. . Avoid touching your eyes, nose, or mouth with unwashed hands.  . Avoid close contact with people who are sick. . Avoid places or events with large numbers of people in one location, like concerts or sporting events. .   Carefully consider travel plans you have or are making. . If you are planning any travel outside or inside the [place], visit the CDC's Travelers' Health webpage for the latest health notices. . If you have some symptoms but not all symptoms, continue to monitor at home and seek medical attention  . if your symptoms worsen. . If you are having a medical emergency, call 911.   . >>>>>>>>>>>>>>>>>>>>>>>>>>>>>>>>> . We Do NOT Approve of  [facility] [place] Soliciting Our Patients  To Do Home Visits & We Do NOT Approve of [employer] > > > > > > > > > > > > > > > > > > > > > > > > > > > > > > > > > > > > > > >  Preventive Care for Adults  A healthy lifestyle  and preventive care can promote health and wellness. Preventive health guidelines for women include the following key practices.  A routine yearly physical is a good way to check with your health care provider about your health and preventive screening. It is a chance to share any concerns and updates on your health and to receive a thorough exam.  Visit your dentist for a routine exam and preventive care every 6 months. Brush your teeth twice a day and floss once a day. Good oral hygiene prevents tooth decay and gum disease.  The frequency of eye exams is based on your age, health, family medical history, use of contact lenses, and other factors. Follow your health care provider's recommendations for frequency of eye exams.  Eat a healthy diet. Foods like vegetables, fruits, whole grains, low-fat dairy products, and lean protein foods contain the nutrients you need without too many calories. Decrease your intake of foods high in solid fats, added sugars, and salt. Eat the right amount of calories for you. Get information about a proper diet from your health care provider, if necessary.  Regular physical exercise is one of the most important things you can do for your health. Most adults should get at least 150 minutes of moderate-intensity exercise (any activity that increases your heart rate and causes you to sweat) each week. In addition, most adults need muscle-strengthening exercises on 2 or more days a week.  Maintain a healthy weight. The body mass index (BMI) is a screening tool to identify possible weight problems. It provides an estimate of body fat based on height and weight. Your health care provider can find your BMI and can help you achieve or maintain a healthy weight. For adults 20 years and older:  A BMI below 18.5 is considered underweight.  A BMI of 18.5 to 24.9 is normal.  A BMI of 25 to 29.9 is considered overweight.  A BMI of 30 and above is considered obese.  Maintain  normal blood lipids and cholesterol levels by exercising and minimizing your intake of saturated fat. Eat a balanced diet with plenty of fruit and vegetables. If your lipid or cholesterol levels are high, you are over 50, or you are at high risk for heart disease, you may need your cholesterol levels checked more frequently. Ongoing high lipid and cholesterol levels should be treated with medicines if diet and exercise are not working.  If you smoke, find out from your health care provider how to quit. If you do not use tobacco, do not start.  Lung cancer screening is recommended for adults aged 55-80 years who are at high risk for developing lung cancer   because of a history of smoking. A yearly low-dose CT scan of the lungs is recommended for people who have at least a 30-pack-year history of smoking and are a current smoker or have quit within the past 15 years. A pack year of smoking is smoking an average of 1 pack of cigarettes a day for 1 year (for example: 1 pack a day for 30 years or 2 packs a day for 15 years). Yearly screening should continue until the smoker has stopped smoking for at least 15 years. Yearly screening should be stopped for people who develop a health problem that would prevent them from having lung cancer treatment.  Avoid use of street drugs. Do not share needles with anyone. Ask for help if you need support or instructions about stopping the use of drugs.  High blood pressure causes heart disease and increases the risk of stroke.  Ongoing high blood pressure should be treated with medicines if weight loss and exercise do not work.  If you are 55-79 years old, ask your health care provider if you should take aspirin to prevent strokes.  Diabetes screening involves taking a blood sample to check your fasting blood sugar level. This should be done once every 3 years, after age 45, if you are within normal weight and without risk factors for diabetes. Testing should be considered  at a younger age or be carried out more frequently if you are overweight and have at least 1 risk factor for diabetes.  Breast cancer screening is essential preventive care for women. You should practice "breast self-awareness." This means understanding the normal appearance and feel of your breasts and may include breast self-examination. Any changes detected, no matter how small, should be reported to a health care provider. Women in their 20s and 30s should have a clinical breast exam (CBE) by a health care provider as part of a regular health exam every 1 to 3 years. After age 40, women should have a CBE every year. Starting at age 40, women should consider having a mammogram (breast X-ray test) every year. Women who have a family history of breast cancer should talk to their health care provider about genetic screening. Women at a high risk of breast cancer should talk to their health care providers about having an MRI and a mammogram every year.  Breast cancer gene (BRCA)-related cancer risk assessment is recommended for women who have family members with BRCA-related cancers. BRCA-related cancers include breast, ovarian, tubal, and peritoneal cancers. Having family members with these cancers may be associated with an increased risk for harmful changes (mutations) in the breast cancer genes BRCA1 and BRCA2. Results of the assessment will determine the need for genetic counseling and BRCA1 and BRCA2 testing.  Routine pelvic exams to screen for cancer are no longer recommended for nonpregnant women who are considered low risk for cancer of the pelvic organs (ovaries, uterus, and vagina) and who do not have symptoms. Ask your health care provider if a screening pelvic exam is right for you.  If you have had past treatment for cervical cancer or a condition that could lead to cancer, you need Pap tests and screening for cancer for at least 20 years after your treatment. If Pap tests have been discontinued,  your risk factors (such as having a new sexual partner) need to be reassessed to determine if screening should be resumed. Some women have medical problems that increase the chance of getting cervical cancer. In these cases, your health care   provider may recommend more frequent screening and Pap tests.    Colorectal cancer can be detected and often prevented. Most routine colorectal cancer screening begins at the age of 50 years and continues through age 75 years. However, your health care provider may recommend screening at an earlier age if you have risk factors for colon cancer. On a yearly basis, your health care provider may provide home test kits to check for hidden blood in the stool. Use of a small camera at the end of a tube, to directly examine the colon (sigmoidoscopy or colonoscopy), can detect the earliest forms of colorectal cancer. Talk to your health care provider about this at age 50, when routine screening begins.  Direct exam of the colon should be repeated every 5-10 years through age 75 years, unless early forms of pre-cancerous polyps or small growths are found.  Osteoporosis is a disease in which the bones lose minerals and strength with aging. This can result in serious bone fractures or breaks. The risk of osteoporosis can be identified using a bone density scan. Women ages 65 years and over and women at risk for fractures or osteoporosis should discuss screening with their health care providers. Ask your health care provider whether you should take a calcium supplement or vitamin D to reduce the rate of osteoporosis.  Menopause can be associated with physical symptoms and risks. Hormone replacement therapy is available to decrease symptoms and risks. You should talk to your health care provider about whether hormone replacement therapy is right for you.  Use sunscreen. Apply sunscreen liberally and repeatedly throughout the day. You should seek shade when your shadow is shorter  than you. Protect yourself by wearing long sleeves, pants, a wide-brimmed hat, and sunglasses year round, whenever you are outdoors.  Once a month, do a whole body skin exam, using a mirror to look at the skin on your back. Tell your health care provider of new moles, moles that have irregular borders, moles that are larger than a pencil eraser, or moles that have changed in shape or color.  Stay current with required vaccines (immunizations).  Influenza vaccine. All adults should be immunized every year.  Tetanus, diphtheria, and acellular pertussis (Td, Tdap) vaccine. Pregnant women should receive 1 dose of Tdap vaccine during each pregnancy. The dose should be obtained regardless of the length of time since the last dose. Immunization is preferred during the 27th-36th week of gestation. An adult who has not previously received Tdap or who does not know her vaccine status should receive 1 dose of Tdap. This initial dose should be followed by tetanus and diphtheria toxoids (Td) booster doses every 10 years. Adults with an unknown or incomplete history of completing a 3-dose immunization series with Td-containing vaccines should begin or complete a primary immunization series including a Tdap dose. Adults should receive a Td booster every 10 years.    Zoster vaccine. One dose is recommended for adults aged 60 years or older unless certain conditions are present.    Pneumococcal 13-valent conjugate (PCV13) vaccine. When indicated, a person who is uncertain of her immunization history and has no record of immunization should receive the PCV13 vaccine. An adult aged 19 years or older who has certain medical conditions and has not been previously immunized should receive 1 dose of PCV13 vaccine. This PCV13 should be followed with a dose of pneumococcal polysaccharide (PPSV23) vaccine. The PPSV23 vaccine dose should be obtained at least 1 or more year(s) after the dose of   PCV13 vaccine. An adult aged 19  years or older who has certain medical conditions and previously received 1 or more doses of PPSV23 vaccine should receive 1 dose of PCV13. The PCV13 vaccine dose should be obtained 1 or more years after the last PPSV23 vaccine dose.    Pneumococcal polysaccharide (PPSV23) vaccine. When PCV13 is also indicated, PCV13 should be obtained first. All adults aged 65 years and older should be immunized. An adult younger than age 65 years who has certain medical conditions should be immunized. Any person who resides in a nursing home or long-term care facility should be immunized. An adult smoker should be immunized. People with an immunocompromised condition and certain other conditions should receive both PCV13 and PPSV23 vaccines. People with human immunodeficiency virus (HIV) infection should be immunized as soon as possible after diagnosis. Immunization during chemotherapy or radiation therapy should be avoided. Routine use of PPSV23 vaccine is not recommended for American Indians, [place] Natives, or people younger than 65 years unless there are medical conditions that require PPSV23 vaccine. When indicated, people who have unknown immunization and have no record of immunization should receive PPSV23 vaccine. One-time revaccination 5 years after the first dose of PPSV23 is recommended for people aged 19-64 years who have chronic kidney failure, nephrotic syndrome, asplenia, or immunocompromised conditions. People who received 1-2 doses of PPSV23 before age 65 years should receive another dose of PPSV23 vaccine at age 65 years or later if at least 5 years have passed since the previous dose. Doses of PPSV23 are not needed for people immunized with PPSV23 at or after age 65 years.   Preventive Services / Frequency  Ages 65 years and over  Blood pressure check.  Lipid and cholesterol check.  Lung cancer screening. / Every year if you are aged 55-80 years and have a 30-pack-year history of smoking and  currently smoke or have quit within the past 15 years. Yearly screening is stopped once you have quit smoking for at least 15 years or develop a health problem that would prevent you from having lung cancer treatment.  Clinical breast exam.** / Every year after age 40 years.   BRCA-related cancer risk assessment.** / For women who have family members with a BRCA-related cancer (breast, ovarian, tubal, or peritoneal cancers).  Mammogram.** / Every year beginning at age 40 years and continuing for as long as you are in good health. Consult with your health care provider.  Pap test.** / Every 3 years starting at age 30 years through age 65 or 70 years with 3 consecutive normal Pap tests. Testing can be stopped between 65 and 70 years with 3 consecutive normal Pap tests and no abnormal Pap or HPV tests in the past 10 years.  Fecal occult blood test (FOBT) of stool. / Every year beginning at age 50 years and continuing until age 75 years. You may not need to do this test if you get a colonoscopy every 10 years.  Flexible sigmoidoscopy or colonoscopy.** / Every 5 years for a flexible sigmoidoscopy or every 10 years for a colonoscopy beginning at age 50 years and continuing until age 75 years.  Hepatitis C blood test.** / For all people born from 1945 through 1965 and any individual with known risks for hepatitis C.  Osteoporosis screening.** / A one-time screening for women ages 65 years and over and women at risk for fractures or osteoporosis.  Skin self-exam. / Monthly.  Influenza vaccine. / Every year.  Tetanus, diphtheria, and acellular   pertussis (Tdap/Td) vaccine.** / 1 dose of Td every 10 years.  Zoster vaccine.** / 1 dose for adults aged 60 years or older.  Pneumococcal 13-valent conjugate (PCV13) vaccine.** / Consult your health care provider.  Pneumococcal polysaccharide (PPSV23) vaccine.** / 1 dose for all adults aged 65 years and older. Screening for abdominal aortic aneurysm (AAA)   by ultrasound is recommended for people who have history of high blood pressure or who are current or former smokers. ++++++++++++++++++++ Recommend Adult Low Dose Aspirin or  coated  Aspirin 81 mg daily  To reduce risk of Colon Cancer 40 %,  Skin Cancer 26 % ,  Melanoma 46%  and  Pancreatic cancer 60% ++++++++++++++++++++ Vitamin D goal  is between 70-100.  Please make sure that you are taking your Vitamin D as directed.  It is very important as a natural anti-inflammatory  helping hair, skin, and nails, as well as reducing stroke and heart attack risk.  It helps your bones and helps with mood. It also decreases numerous cancer risks so please take it as directed.  Low Vit D is associated with a 200-300% higher risk for CANCER  and 200-300% higher risk for HEART   ATTACK  &  STROKE.   ...................................... It is also associated with higher death rate at younger ages,  autoimmune diseases like Rheumatoid arthritis, Lupus, Multiple Sclerosis.    Also many other serious conditions, like depression, Alzheimer's Dementia, infertility, muscle aches, fatigue, fibromyalgia - just to name a few. ++++++++++++++++++ Recommend the book "The END of DIETING" by Dr Joel Fuhrman  & the book "The END of DIABETES " by Dr Joel Fuhrman At [employer] - get book & Audio CD's    Being diabetic has a  300% increased risk for heart attack, stroke, cancer, and alzheimer- type vascular dementia. It is very important that you work harder with diet by avoiding all foods that are white. Avoid white rice (brown & wild rice is OK), white potatoes (sweetpotatoes in moderation is OK), White bread or wheat bread or anything made out of white flour like bagels, donuts, rolls, buns, biscuits, cakes, pastries, cookies, pizza crust, and pasta (made from white flour & egg whites) - vegetarian pasta or spinach or wheat pasta is OK. Multigrain breads like Arnold's or Pepperidge [employer], or multigrain sandwich thins  or flatbreads.  Diet, exercise and weight loss can reverse and cure diabetes in the early stages.  Diet, exercise and weight loss is very important in the control and prevention of complications of diabetes which affects every system in your body, ie. Brain - dementia/stroke, eyes - glaucoma/blindness, heart - heart attack/heart failure, kidneys - dialysis, stomach - gastric paralysis, intestines - malabsorption, nerves - severe painful neuritis, circulation - gangrene & loss of a leg(s), and finally cancer and Alzheimers.    I recommend avoid fried & greasy foods,  sweets/candy, white rice (brown or wild rice or Quinoa is OK), white potatoes (sweet potatoes are OK) - anything made from white flour - bagels, doughnuts, rolls, buns, biscuits,white and wheat breads, pizza crust and traditional pasta made of white flour & egg white(vegetarian pasta or spinach or wheat pasta is OK).  Multi-grain bread is OK - like multi-grain flat bread or sandwich thins. Avoid alcohol in excess. Exercise is also important.    Eat all the vegetables you want - avoid meat, especially red meat and dairy - especially cheese.  Cheese is the most concentrated form of trans-fats which is the worst thing to clog   up our arteries. Veggie cheese is OK which can be found in the fresh produce section at [employer] or [employer] or [employer]  +++++++++++++++++++ DASH Eating Plan  DASH stands for "Dietary Approaches to Stop Hypertension."   The DASH eating plan is a healthy eating plan that has been shown to reduce high blood pressure (hypertension). Additional health benefits may include reducing the risk of type 2 diabetes mellitus, heart disease, and stroke. The DASH eating plan may also help with weight loss. WHAT DO I NEED TO KNOW ABOUT THE DASH EATING PLAN? For the DASH eating plan, you will follow these general guidelines:  Choose foods with a percent daily value for sodium of less than 5% (as listed on the food  label).  Use salt-free seasonings or herbs instead of table salt or sea salt.  Check with your health care provider or pharmacist before using salt substitutes.  Eat lower-sodium products, often labeled as "lower sodium" or "no salt added."  Eat fresh foods.  Eat more vegetables, fruits, and low-fat dairy products.  Choose whole grains. Look for the word "whole" as the first word in the ingredient list.  Choose fish   Limit sweets, desserts, sugars, and sugary drinks.  Choose heart-healthy fats.  Eat veggie cheese   Eat more home-cooked food and less restaurant, buffet, and fast food.  Limit fried foods.  Cook foods using methods other than frying.  Limit canned vegetables. If you do use them, rinse them well to decrease the sodium.  When eating at a restaurant, ask that your food be prepared with less salt, or no salt if possible.                      WHAT FOODS CAN I EAT? Read Dr Joel Fuhrman's books on The End of Dieting & The End of Diabetes  Grains Whole grain or whole wheat bread. Brown rice. Whole grain or whole wheat pasta. Quinoa, bulgur, and whole grain cereals. Low-sodium cereals. Corn or whole wheat flour tortillas. Whole grain cornbread. Whole grain crackers. Low-sodium crackers.  Vegetables Fresh or frozen vegetables (raw, steamed, roasted, or grilled). Low-sodium or reduced-sodium tomato and vegetable juices. Low-sodium or reduced-sodium tomato sauce and paste. Low-sodium or reduced-sodium canned vegetables.   Fruits All fresh, canned (in natural juice), or frozen fruits.  Protein Products  All fish and seafood.  Dried beans, peas, or lentils. Unsalted nuts and seeds. Unsalted canned beans.  Dairy Low-fat dairy products, such as skim or 1% milk, 2% or reduced-fat cheeses, low-fat ricotta or cottage cheese, or plain low-fat yogurt. Low-sodium or reduced-sodium cheeses.  Fats and Oils Tub margarines without trans fats. Light or reduced-fat mayonnaise  and salad dressings (reduced sodium). Avocado. Safflower, olive, or canola oils. Natural peanut or almond butter.  Other Unsalted popcorn and pretzels. The items listed above may not be a complete list of recommended foods or beverages. Contact your dietitian for more options.  +++++++++++++++  WHAT FOODS ARE NOT RECOMMENDED? Grains/ White flour or wheat flour White bread. White pasta. White rice. Refined cornbread. Bagels and croissants. Crackers that contain trans fat.  Vegetables  Creamed or fried vegetables. Vegetables in a . Regular canned vegetables. Regular canned tomato sauce and paste. Regular tomato and vegetable juices.  Fruits Dried fruits. Canned fruit in light or heavy syrup. Fruit juice.  Meat and Other Protein Products Meat in general - RED meat & White meat.  Fatty cuts of meat. Ribs, chicken wings, all processed meats as   bacon, sausage, bologna, salami, fatback, hot dogs, bratwurst and packaged luncheon meats.  Dairy Whole or 2% milk, cream, half-and-half, and cream cheese. Whole-fat or sweetened yogurt. Full-fat cheeses or blue cheese. Non-dairy creamers and whipped toppings. Processed cheese, cheese spreads, or cheese curds.  Condiments Onion and garlic salt, seasoned salt, table salt, and sea salt. Canned and packaged gravies. Worcestershire sauce. Tartar sauce. Barbecue sauce. Teriyaki sauce. Soy sauce, including reduced sodium. Steak sauce. Fish sauce. Oyster sauce. Cocktail sauce. Horseradish. Ketchup and mustard. Meat flavorings and tenderizers. Bouillon cubes. Hot sauce. Tabasco sauce. Marinades. Taco seasonings. Relishes.  Fats and Oils Butter, stick margarine, lard, shortening and bacon fat. Coconut, palm kernel, or palm oils. Regular salad dressings.  Pickles and olives. Salted popcorn and pretzels.  The items listed above may not be a complete list of foods and beverages to avoid.    

## 2018-11-21 NOTE — Progress Notes (Signed)
3 Month Follow Up   Assessment and Plan:  [name] [name] was seen today for follow-up.  Diagnoses and all orders for this visit:  Essential hypertension Well controlled with current medications  Reports in 110-120's at home, asymptomatic Monitor blood pressure at home; patient to call if consistently greater than 140/90 or 90/60 or less. Continue DASH diet.   Reminder to go to the ER if any CP, SOB, nausea, dizziness, severe HA, changes vision/speech, left arm numbness and tingling and jaw pain. -     CBC with Differential/Platelet -     COMPLETE METABOLIC PANEL WITH GFR -     Magnesium  Hyperlipidemia, unspecified hyperlipidemia type Discussed dietary and exercise modifications -     Lipid panel  Iron deficiency anemia, unspecified iron deficiency anemia type Not taking supplement at this time, well controlled. Will check CBC and TIBC yearly  Abnormal Glucose Discussed dietary and exercise modifications Well controlled Will defer A1c and insulin this visit  BMI 22.0-22.9, adult Discussed dietary and exercise modifications  Cystocele with prolapse Currently going to pelivc floor therapy for this Continue willowing with GYN / Urology for this Has appointment with [name] Shock NP in one month.  Seasonal allergies Doing well at this time Uses cetirizine PRN, continue with benefit  Vitamin D deficiency Taking supplementation, continue Defer level this check  Osteopenia of left femoral neck Taking calcium and vit D Due for DEXA next year 2021  History of Thyroiditis* Low TSH level -Check TSH today  Medication management Continued   Continue diet and meds as discussed. Further disposition pending results of labs. Discussed med's effects and SE's.   Over 30 minutes of exam, counseling, chart review, and critical decision making was performed.   Future Appointments  Date Time Provider [place]  12/26/2018  9:30 AM Regina Eck, CNM [place] None   03/01/2019 11:00 AM Unk Pinto, MD [facility] None    ----------------------------------------------------------------------------------------------------------------------  HPI 68 y.o. female  presents for 3 month follow up on HTN, HLD, history of pre-diabetes, weight and vitamin D deficiency.   BMI is Body mass index is 22.53 kg/m., she has been working on diet and exercise. Wt Readings from Last 3 Encounters:  11/21/18 148 lb 3.2 oz (67.2 kg)  08/21/18 144 lb 9.6 oz (65.6 kg)  05/16/18 147 lb (66.7 kg)   Her HTN predates 2001.  She was having soft B/P at one time.  She checks her blood pressure daily.  She reports ranges of 130's-120's over 70's-80's.  She is taking Norvasc 2.5mg  every morning. Her blood pressure has been controlled at home, today their BP is BP: 122/80  She does workout. She denies any cardiac symptoms, chest pains, palpitations, shortness of breath, dizziness or lower extremity edema.     She is not on cholesterol medication and denies myalgias. Her cholesterol is at goal. The cholesterol last visit was:   Lab Results  Component Value Date   CHOL 184 08/21/2018   HDL 64 08/21/2018   LDLCALC 99 08/21/2018   TRIG 111 08/21/2018   CHOLHDL 2.9 08/21/2018    She has been working on diet and exercise for prediabetes( A1c 6% / 2014, 5.9% / 2017), and denies foot ulcerations, hyperglycemia, hypoglycemia , nausea, polydipsia and polyuria. Last A1C in the office was:  Lab Results  Component Value Date   HGBA1C 5.6 08/21/2018   Patient is on Vitamin D supplement and was diagnosed with deficiency in 2008 (24).  Last check she was at goal. Lab  Results  Component Value Date   VD25OH 100 08/21/2018       Current Medications:  Current Outpatient Medications on File Prior to Visit  Medication Sig  . acetaminophen (TYLENOL) 325 MG tablet Take 2 tablets (650 mg total) by mouth every 6 (six) hours as needed for mild pain (or Fever >/= 101).  Marland Kitchen amLODipine (NORVASC)  2.5 MG tablet Take 1 tablet (2.5 mg total) by mouth daily.  . Ascorbic Acid (VITAMIN C) 1000 MG tablet Take 1,000 mg by mouth daily.  Marland Kitchen aspirin EC 81 MG tablet Take 81 mg by mouth daily.  . calcium carbonate (OS-CAL) 600 MG TABS tablet Take 600 mg by mouth daily with breakfast.  . Cholecalciferol (VITAMIN D PO) Take 5,000 Units by mouth every morning.   . fish oil-omega-3 fatty acids 1000 MG capsule Take 1 g by mouth 3 (three) times daily with meals.   . Flaxseed, Linseed, (FLAXSEED OIL PO) Take 1 tablet by mouth 3 (three) times daily with meals.   . meloxicam (MOBIC) 15 MG tablet TAKE 1 TABLET BY MOUTH ONCE DAILY AS NEEDED WITH FOOD FOR PAIN  . Multiple Vitamin (MULTIVITAMIN) tablet Take 1 tablet by mouth every morning.   . zinc gluconate 50 MG tablet Take 50 mg by mouth daily.   No current facility-administered medications on file prior to visit.     Allergies:  Allergies  Allergen Reactions  . Amoxil [Amoxicillin] Rash  . Ampicillin Rash     Medical History:  Past Medical History:  Diagnosis Date  . Abnormal Pap smear of cervix   . Acute lower GI bleeding 02/16/2016  . Heart murmur   . Hyperlipidemia   . Hypertension   . Prediabetes   . Seasonal allergies   . Vitamin D deficiency     Family history- Reviewed and unchanged   Social history- Reviewed and unchanged   Names of Other Physician/Practitioners you currently use: 1. [facility] here for primary care 2. Eye Exam 2020 3.Dentist UTD 2020, Q50months  Patient Care Team: Unk Pinto, MD as PCP - General (Internal Medicine)   Screening Tests: Immunization History  Administered Date(s) Administered  . Influenza Split 01/03/2013  . Influenza, High Dose Seasonal PF 10/23/2015, 11/08/2016, 11/25/2017, 10/04/2018  . PPD Test 07/13/2013  . Pneumococcal Conjugate-13 01/08/2016  . Pneumococcal Polysaccharide-23 12/19/2017  . Pneumococcal-Unspecified 01/18/2002  . Tdap  01/19/2003, 07/05/2012  . Zoster 07/05/2012     Vaccinations: TD or Tdap: 2014  Influenza: 2019  Pneumococcal: 2019 Prevnar13: 2017 Shingles/Zostavax: 2014   Preventative Care: Last colonoscopy: 2018 Last mammogram: 01/2018 Last pap smear/pelvic exam: 12/2017 DEXA: 01/2016 T -1.2 osteopenia L Femur, DUE 2021  Imaging: Chest X-ray: 2019 EKG: 2019 ECHO: N/A    Review of Systems:  Review of Systems  Constitutional: Negative for chills, diaphoresis, fever, malaise/fatigue and weight loss.  HENT: Negative for congestion, ear discharge, ear pain, hearing loss, nosebleeds, sinus pain, sore throat and tinnitus.   Eyes: Negative for blurred vision, double vision, photophobia, pain, discharge and redness.  Respiratory: Negative for cough, hemoptysis, sputum production, shortness of breath, wheezing and stridor.   Cardiovascular: Negative for chest pain, palpitations, orthopnea, claudication, leg swelling and PND.  Gastrointestinal: Negative for abdominal pain, blood in stool, constipation, diarrhea, heartburn, melena, nausea and vomiting.  Genitourinary: Negative for dysuria, flank pain, frequency, hematuria and urgency.  Musculoskeletal: Negative for back pain, falls, joint pain, myalgias and neck pain.  Skin: Negative for itching and rash.  Neurological:  Negative for dizziness, tingling, tremors, sensory change, speech change, focal weakness, seizures, loss of consciousness, weakness and headaches.  Endo/Heme/Allergies: Negative for environmental allergies and polydipsia. Does not bruise/bleed easily.  Psychiatric/Behavioral: Negative for depression, hallucinations, memory loss, substance abuse and suicidal ideas. The patient is not nervous/anxious and does not have insomnia.       Physical Exam: BP 122/80   Pulse 75   Temp 97.7 F (36.5 C)   Wt 148 lb 3.2 oz (67.2 kg)   LMP 11/17/2002   SpO2 98%   BMI 22.53 kg/m  Wt Readings from Last 3 Encounters:  11/21/18 148 lb 3.2  oz (67.2 kg)  08/21/18 144 lb 9.6 oz (65.6 kg)  05/16/18 147 lb (66.7 kg)   General Appearance: Well nourished, in no apparent distress. Eyes: PERRLA, EOMs, conjunctiva no swelling or erythema Sinuses: No Frontal/maxillary tenderness ENT/Mouth: Ext aud canals clear, TMs without erythema, bulging. No erythema, swelling, or exudate on post pharynx.  Tonsils not swollen or erythematous. Hearing normal.  Neck: Supple, thyroid normal.  Respiratory: Respiratory effort normal, BS equal bilaterally without rales, rhonchi, wheezing or stridor.  Cardio: RRR with no MRGs. Brisk peripheral pulses without edema.  Abdomen: Soft, + BS.  Non tender, no guarding, rebound, hernias, masses. Lymphatics: Non tender without lymphadenopathy.  Musculoskeletal: Full ROM, 5/5 strength, Normal gait Skin: Warm, dry without rashes, lesions, ecchymosis.  Neuro: Cranial nerves intact. No cerebellar symptoms.  Psych: Awake and oriented X 3, normal affect, Insight and Judgment appropriate.    Garnet Sierras, NP [facility] 5:10 PM

## 2018-11-22 ENCOUNTER — Other Ambulatory Visit: Payer: Self-pay | Admitting: Internal Medicine

## 2018-11-22 DIAGNOSIS — Z1231 Encounter for screening mammogram for malignant neoplasm of breast: Secondary | ICD-10-CM

## 2018-11-22 LAB — COMPLETE METABOLIC PANEL WITH GFR
AG Ratio: 2 (calc) (ref 1.0–2.5)
ALT: 15 U/L (ref 6–29)
AST: 19 U/L (ref 10–35)
Albumin: 4.7 g/dL (ref 3.6–5.1)
Alkaline phosphatase (APISO): 54 U/L (ref 37–153)
BUN: 21 mg/dL (ref 7–25)
CO2: 30 mmol/L (ref 20–32)
Calcium: 10.1 mg/dL (ref 8.6–10.4)
Chloride: 103 mmol/L (ref 98–110)
Creat: 0.77 mg/dL (ref 0.50–0.99)
GFR, Est African American: 92 mL/min/{1.73_m2} (ref >=60)
GFR, Est Non African American: 79 mL/min/{1.73_m2} (ref >=60)
Globulin: 2.3 g/dL (calc) (ref 1.9–3.7)
Glucose, Bld: 91 mg/dL (ref 65–99)
Potassium: 4.6 mmol/L (ref 3.5–5.3)
Sodium: 141 mmol/L (ref 135–146)
Total Bilirubin: 0.5 mg/dL (ref 0.2–1.2)
Total Protein: 7 g/dL (ref 6.1–8.1)

## 2018-11-22 LAB — CBC WITH DIFFERENTIAL/PLATELET
Absolute Monocytes: 441 cells/uL (ref 200–950)
Basophils Absolute: 50 cells/uL (ref 0–200)
Basophils Relative: 0.8 %
Eosinophils Absolute: 101 cells/uL (ref 15–500)
Eosinophils Relative: 1.6 %
HCT: 36.3 % (ref 35.0–45.0)
Hemoglobin: 12.3 g/dL (ref 11.7–15.5)
Lymphs Abs: 2136 cells/uL (ref 850–3900)
MCH: 30.8 pg (ref 27.0–33.0)
MCHC: 33.9 g/dL (ref 32.0–36.0)
MCV: 90.8 fL (ref 80.0–100.0)
MPV: 11.3 fL (ref 7.5–12.5)
Monocytes Relative: 7 %
Neutro Abs: 3572 cells/uL (ref 1500–7800)
Neutrophils Relative %: 56.7 %
Platelets: 260 10*3/uL (ref 140–400)
RBC: 4 10*6/uL (ref 3.80–5.10)
RDW: 11.8 % (ref 11.0–15.0)
Total Lymphocyte: 33.9 %
WBC: 6.3 10*3/uL (ref 3.8–10.8)

## 2018-11-22 LAB — LIPID PANEL
Cholesterol: 167 mg/dL (ref <200)
HDL: 57 mg/dL (ref >=50)
LDL Cholesterol (Calc): 89 mg/dL (calc)
Non-HDL Cholesterol (Calc): 110 mg/dL (calc) (ref <130)
Total CHOL/HDL Ratio: 2.9 (calc) (ref <5.0)
Triglycerides: 113 mg/dL (ref <150)

## 2018-11-22 LAB — TSH: TSH: 4.18 mIU/L (ref 0.40–4.50)

## 2018-12-05 DIAGNOSIS — H52203 Unspecified astigmatism, bilateral: Secondary | ICD-10-CM | POA: Diagnosis not present

## 2018-12-05 DIAGNOSIS — H40013 Open angle with borderline findings, low risk, bilateral: Secondary | ICD-10-CM | POA: Diagnosis not present

## 2018-12-22 ENCOUNTER — Other Ambulatory Visit: Payer: Self-pay

## 2018-12-26 ENCOUNTER — Encounter: Payer: Self-pay | Admitting: Certified Nurse Midwife

## 2018-12-26 ENCOUNTER — Other Ambulatory Visit: Payer: Self-pay

## 2018-12-26 ENCOUNTER — Ambulatory Visit (INDEPENDENT_AMBULATORY_CARE_PROVIDER_SITE_OTHER): Payer: Medicare HMO | Admitting: Certified Nurse Midwife

## 2018-12-26 VITALS — BP 120/76 | HR 70 | Temp 97.2°F | Resp 16 | Ht 67.5 in | Wt 144.0 lb

## 2018-12-26 DIAGNOSIS — Z8739 Personal history of other diseases of the musculoskeletal system and connective tissue: Secondary | ICD-10-CM

## 2018-12-26 DIAGNOSIS — Z01419 Encounter for gynecological examination (general) (routine) without abnormal findings: Secondary | ICD-10-CM

## 2018-12-26 NOTE — Progress Notes (Signed)
68 y.o. G61P2002 Married  Caucasian Fe here for annual exam.  Menopausal with vaginal dryness, coconut oil working well uses prn and for sexual activity. Denies vaginal bleeding. Had sister that had Covid, mild case, recuperated well.  Sees PCP for hypertension management , aex, labs. Has mammogram scheduled for 01/2019. Saw Urology for urinary incontinence, has improved with pelvic floor PT. Happy with improvement. No other health issues today.  Patient's last menstrual period was 11/17/2002.          Sexually active: Yes.    The current method of family planning is tubal ligation and post menopausal status.  Exercising: No.  exercise Smoker:  no  Review of Systems  Constitutional: Negative.   HENT: Negative.   Eyes: Negative.   Respiratory: Negative.   Cardiovascular: Negative.   Gastrointestinal: Negative.   Genitourinary: Negative.   Musculoskeletal:       Right hip pain  Skin: Negative.   Neurological: Negative.   Endo/Heme/Allergies: Negative.   Psychiatric/Behavioral: Negative.     Health Maintenance: Pap:  11-26-14 neg HPV HR neg, 12-21-17 neg HPV HR neg History of Abnormal Pap: cryo yrs ago MMG:  01-25-2018 category b density birads 1:neg Self Breast exams: occ Colonoscopy:  2018 polyps f/u 4yrs with Dr Carlean Purl, Had GI bleed & was seen in ER BMD:   2018 osteopenia TDaP:  2014 Shingles: 2014 Pneumonia: 2019 Hep C and HIV: both neg 2015 Labs: with PCP   reports that she has never smoked. She has never used smokeless tobacco. She reports current alcohol use. She reports that she does not use drugs.  Past Medical History:  Diagnosis Date  . Abnormal Pap smear of cervix   . Acute lower GI bleeding 02/16/2016  . Heart murmur   . Hyperlipidemia   . Hypertension   . Prediabetes   . Seasonal allergies   . Vitamin D deficiency     Past Surgical History:  Procedure Laterality Date  . CARDIAC SURGERY  1958   at age 36 for hole in heart  . CERVIX LESION DESTRUCTION N/A ?  years ago   with abnormal pap  . COLONOSCOPY     polyps  . COLONOSCOPY N/A 02/16/2016   Procedure: COLONOSCOPY;  Surgeon: Ladene Artist, MD;  Location: [facility];  Service: Endoscopy;  Laterality: N/A;  . CRYOTHERAPY     for abnormal pap smear  . TUBAL LIGATION      Current Outpatient Medications  Medication Sig Dispense Refill  . acetaminophen (TYLENOL) 325 MG tablet Take 2 tablets (650 mg total) by mouth every 6 (six) hours as needed for mild pain (or Fever >/= 101). 30 tablet 0  . amLODipine (NORVASC) 2.5 MG tablet Take 1 tablet (2.5 mg total) by mouth daily. 90 tablet 2  . Ascorbic Acid (VITAMIN C) 1000 MG tablet Take 1,000 mg by mouth daily.    Marland Kitchen aspirin EC 81 MG tablet Take 81 mg by mouth daily.    . calcium carbonate (OS-CAL) 600 MG TABS tablet Take 600 mg by mouth daily with breakfast.    . Cholecalciferol (VITAMIN D PO) Take 5,000 Units by mouth every morning.     . fish oil-omega-3 fatty acids 1000 MG capsule Take 1 g by mouth 3 (three) times daily with meals.     . Flaxseed, Linseed, (FLAXSEED OIL PO) Take 1 tablet by mouth 3 (three) times daily with meals.     . meloxicam (MOBIC) 15 MG tablet TAKE 1 TABLET BY MOUTH ONCE  DAILY AS NEEDED WITH FOOD FOR PAIN 90 tablet 0  . Multiple Vitamin (MULTIVITAMIN) tablet Take 1 tablet by mouth every morning.     . zinc gluconate 50 MG tablet Take 50 mg by mouth daily.     No current facility-administered medications for this visit.     Family History  Problem Relation Age of Onset  . Colon cancer Sister 65  . Diabetes Sister   . Diabetes Father   . Diabetes Brother   . Diabetes Sister   . Stomach cancer Neg Hx   . Breast cancer Neg Hx     ROS:  Pertinent items are noted in HPI.  Otherwise, a comprehensive ROS was negative.  Exam:   BP 120/76   Pulse 70   Temp (!) 97.2 F (36.2 C) (Skin)   Resp 16   Ht 5' 7.5" (1.715 m)   Wt 144 lb (65.3 kg)   LMP 11/17/2002   BMI 22.22 kg/m  Height: 5' 7.5" (171.5 cm) Ht Readings  from Last 3 Encounters:  12/26/18 5' 7.5" (1.715 m)  08/21/18 5\' 8"  (1.727 m)  05/16/18 5' 7.75" (1.721 m)    General appearance: alert, cooperative and appears stated age Head: Normocephalic, without obvious abnormality, atraumatic Neck: no adenopathy, supple, symmetrical, trachea midline and thyroid normal to inspection and palpation Lungs: clear to auscultation bilaterally Breasts: normal appearance, no masses or tenderness, No nipple retraction or dimpling, No nipple discharge or bleeding, No axillary or supraclavicular adenopathy Heart: regular rate and rhythm with occasional additional beat. Abdomen: soft, non-tender; no masses,  no organomegaly Extremities: extremities normal, atraumatic, no cyanosis or edema Skin: Skin color, texture, turgor normal. No rashes or lesions Lymph nodes: Cervical, supraclavicular, and axillary nodes normal. No abnormal inguinal nodes palpated Neurologic: Grossly normal   Pelvic: External genitalia:  no lesions, atrophic appearance              Urethra:  normal appearing urethra with no masses, tenderness or lesions              Bartholin's and Skene's: normal                 Vagina: normal appearing vagina with normal color and discharge, no lesions, pelvic tone improved              Cervix: no cervical motion tenderness, no lesions and normal appearance              Pap taken: No. Bimanual Exam:  Uterus:  normal size, contour, position, consistency, mobility, non-tender and anteverted              Adnexa: normal adnexa and no mass, fullness, tenderness               Rectovaginal: Confirms               Anus:  normal sphincter tone, no lesions  Chaperone present: yes  A:  Well Woman with normal exam  Post menopausal no HRT  Vaginal dryness, coconut oil working well  History of incontinence, urine and bowel improved and has stopped after pelvic floor PT.  Hypertension with occasional irregular heart beat with PCP management only, no change per  patient  BMD due with mammogram  P:   Reviewed health and wellness pertinent to exam  Aware of need to advise if vaginal bleeding  Encouraged to continue pelvic floor exercises to maintain good tone and control of stress incontinence  Continue follow up with PCP  as indicated. Discussed if she feels change with heart she needs to seek ER. Patient aware.   Order placed for BMD  Pap smear: no   counseled on breast self exam, mammography screening, feminine hygiene, osteoporosis, adequate intake of calcium and vitamin D, diet and exercise, Kegel's exercises  return annually or prn  An After Visit Summary was printed and given to the patient.

## 2018-12-26 NOTE — Patient Instructions (Addendum)

## 2018-12-27 ENCOUNTER — Other Ambulatory Visit: Payer: Self-pay | Admitting: Certified Nurse Midwife

## 2018-12-27 DIAGNOSIS — Z8739 Personal history of other diseases of the musculoskeletal system and connective tissue: Secondary | ICD-10-CM

## 2019-01-05 ENCOUNTER — Encounter: Payer: Self-pay | Admitting: Internal Medicine

## 2019-01-29 ENCOUNTER — Ambulatory Visit
Admission: RE | Admit: 2019-01-29 | Discharge: 2019-01-29 | Disposition: A | Discharge status: Home / Self Care | Payer: Medicare HMO | Source: Ambulatory Visit | Attending: Internal Medicine | Admitting: Internal Medicine

## 2019-01-29 ENCOUNTER — Other Ambulatory Visit: Payer: Self-pay

## 2019-01-29 DIAGNOSIS — Z1231 Encounter for screening mammogram for malignant neoplasm of breast: Secondary | ICD-10-CM

## 2019-02-12 ENCOUNTER — Other Ambulatory Visit: Payer: Self-pay | Admitting: Internal Medicine

## 2019-02-23 ENCOUNTER — Other Ambulatory Visit: Payer: Self-pay | Admitting: Physician Assistant

## 2019-03-01 ENCOUNTER — Encounter: Payer: Self-pay | Admitting: Internal Medicine

## 2019-03-01 DIAGNOSIS — R69 Illness, unspecified: Secondary | ICD-10-CM | POA: Diagnosis not present

## 2019-03-08 ENCOUNTER — Encounter: Payer: Self-pay | Admitting: Internal Medicine

## 2019-03-11 ENCOUNTER — Encounter: Payer: Self-pay | Admitting: Internal Medicine

## 2019-03-11 NOTE — Progress Notes (Signed)
Annual Screening/Preventative Visit & Comprehensive Evaluation &  Examination     [name]  presents for a Screening /Preventative Visit & comprehensive evaluation and management of multiple medical co-morbidities.  Patient has been followed for HTN, HLD, Prediabetes  and Vitamin D Deficiency.      Patient's HTN predates circa 2001. Patient's BP has been controlled at home and patient denies any cardiac symptoms as chest pain, palpitations, shortness of breath, dizziness or ankle swelling. Today's BP is at goal - 114/80.      Patient's hyperlipidemia is controlled with diet. Last lipids were at goal:  Lab Results  Component Value Date   CHOL 167 11/21/2018   HDL 57 11/21/2018   LDLCALC 89 11/21/2018   TRIG 113 11/21/2018   CHOLHDL 2.9 11/21/2018       Patient has hx/o prediabetes (A1c 6.0% / 2014)  and patient denies reactive hypoglycemic symptoms, visual blurring, diabetic polys or paresthesias. Last A1c was Normal & at goal:  Lab Results  Component Value Date   HGBA1C 5.6 08/21/2018       Finally, patient has history of Vitamin D Deficiency ("24" / 2008)  and last Vitamin D was at goal:  Lab Results  Component Value Date   VD25OH 100 08/21/2018    Current Outpatient Medications on File Prior to Visit  Medication Sig  . acetaminophen (TYLENOL) 325 MG tablet Take 2 tablets (650 mg total) by mouth every 6 (six) hours as needed for mild pain (or Fever >/= 101).  Marland Kitchen amLODipine (NORVASC) 2.5 MG tablet Take 1 tablet Daily for BP  . Ascorbic Acid (VITAMIN C) 1000 MG tablet Take 1,000 mg by mouth daily.  Marland Kitchen aspirin EC 81 MG tablet Take 81 mg by mouth daily.  . calcium carbonate (OS-CAL) 600 MG TABS tablet Take 600 mg by mouth daily with breakfast.  . Cholecalciferol (VITAMIN D PO) Take 5,000 Units by mouth every morning.   . fish oil-omega-3 fatty acids 1000 MG capsule Take 1 g by mouth 3 (three) times daily with meals.   . Flaxseed, Linseed, (FLAXSEED OIL PO) Take  1 tablet by mouth 3 (three) times daily with meals.   . meloxicam (MOBIC) 15 MG tablet Take 1/2 to 1 tablet Daily with Food for Pain & Inflammation  . Multiple Vitamin (MULTIVITAMIN) tablet Take 1 tablet by mouth every morning.   . zinc gluconate 50 MG tablet Take 50 mg by mouth daily.   No current facility-administered medications on file prior to visit.   Allergies  Allergen Reactions  . Amoxil [Amoxicillin] Rash  . Ampicillin Rash   Past Medical History:  Diagnosis Date  . Abnormal Pap smear of cervix   . Acute lower GI bleeding 02/16/2016  . Heart murmur   . Hyperlipidemia   . Hypertension   . Prediabetes   . Seasonal allergies   . Vitamin D deficiency    Health Maintenance  Topic Date Due  . MAMMOGRAM  01/29/2020  . COLONOSCOPY  02/15/2021  . TETANUS/TDAP  07/06/2022  . INFLUENZA VACCINE  Completed  . DEXA SCAN  Completed  . Hepatitis C Screening  Completed  . PNA vac Low Risk Adult  Completed   Immunization History  Administered Date(s) Administered  . Influenza Split 01/03/2013  . Influenza, High Dose Seasonal PF 10/23/2015, 11/08/2016, 11/25/2017, 10/04/2018  . PPD Test 07/13/2013  . Pneumococcal Conjugate-13 01/08/2016  . Pneumococcal Polysaccharide-23 12/19/2017  . Pneumococcal-Unspecified 01/18/2002  . Tdap 01/19/2003, 07/05/2012  .  Zoster 07/05/2012    Last Colon -  02/16/2016 - Dr Carlean Purl - recc 5 yr f/u due Jan/Feb 2023)  Last MGM - 01/29/2019  Past Surgical History:  Procedure Laterality Date  . CARDIAC SURGERY  1958   at age 14 for hole in heart  . CERVIX LESION DESTRUCTION N/A ? years ago   with abnormal pap  . COLONOSCOPY     polyps  . COLONOSCOPY N/A 02/16/2016   Procedure: COLONOSCOPY;  Surgeon: Ladene Artist, MD;  Location: [facility];  Service: Endoscopy;  Laterality: N/A;  . CRYOTHERAPY     for abnormal pap smear  . TUBAL LIGATION     Family History  Problem Relation Age of Onset  . Colon cancer Sister 14  . Diabetes Sister     . Diabetes Father   . Diabetes Brother   . Diabetes Sister   . Stomach cancer Neg Hx   . Breast cancer Neg Hx    Social History   Tobacco Use  . Smoking status: Never Smoker  . Smokeless tobacco: Never Used  Substance Use Topics  . Alcohol use: Yes    Alcohol/week: 0.0 - 7.0 standard drinks  . Drug use: No    ROS Constitutional: Denies fever, chills, weight loss/gain, headaches, insomnia,  night sweats, and change in appetite. Does c/o fatigue. Eyes: Denies redness, blurred vision, diplopia, discharge, itchy, watery eyes.  ENT: Denies discharge, congestion, post nasal drip, epistaxis, sore throat, earache, hearing loss, dental pain, Tinnitus, Vertigo, Sinus pain, snoring.  Cardio: Denies chest pain, palpitations, irregular heartbeat, syncope, dyspnea, diaphoresis, orthopnea, PND, claudication, edema Respiratory: denies cough, dyspnea, DOE, pleurisy, hoarseness, laryngitis, wheezing.  Gastrointestinal: Denies dysphagia, heartburn, reflux, water brash, pain, cramps, nausea, vomiting, bloating, diarrhea, constipation, hematemesis, melena, hematochezia, jaundice, hemorrhoids Genitourinary: Denies dysuria, frequency, urgency, nocturia, hesitancy, discharge, hematuria, flank pain Breast: Breast lumps, nipple discharge, bleeding.  Musculoskeletal: Denies arthralgia, myalgia, stiffness, Jt. Swelling, pain, limp, and strain/sprain. Denies falls. Skin: Denies puritis, rash, hives, warts, acne, eczema, changing in skin lesion Neuro: No weakness, tremor, incoordination, spasms, paresthesia, pain Psychiatric: Denies confusion, memory loss, sensory loss. Denies Depression. Endocrine: Denies change in weight, skin, hair change, nocturia, and paresthesia, diabetic polys, visual blurring, hyper / hypo glycemic episodes.  Heme/Lymph: No excessive bleeding, bruising, enlarged lymph nodes.  Physical Exam  BP 114/80   Pulse 68   Temp (!) 96.9 F (36.1 C)   Resp 16   Ht 5\' 8"  (1.727 m)   Wt 145  lb 6.4 oz (66 kg)   LMP 11/17/2002   BMI 22.11 kg/m   General Appearance: Well nourished, well groomed and in no apparent distress.  Eyes: PERRLA, EOMs, conjunctiva no swelling or erythema, normal fundi and vessels. Sinuses: No frontal/maxillary tenderness ENT/Mouth: EACs patent / TMs  nl. Nares clear without erythema, swelling, mucoid exudates. Oral hygiene is good. No erythema, swelling, or exudate. Tongue normal, non-obstructing. Tonsils not swollen or erythematous. Hearing normal.  Neck: Supple, thyroid not palpable. No bruits, nodes or JVD. Respiratory: Respiratory effort normal.  BS equal and clear bilateral without rales, rhonci, wheezing or stridor. Cardio: Heart sounds are normal with regular rate and rhythm and no murmurs, rubs or gallops. Peripheral pulses are normal and equal bilaterally without edema. No aortic or femoral bruits. Chest: symmetric with normal excursions and percussion. Breasts: Symmetric, without lumps, nipple discharge, retractions, or fibrocystic changes.  Abdomen: Flat, soft with bowel sounds active. Nontender, no guarding, rebound, hernias, masses, or organomegaly.  Lymphatics: Non tender without  lymphadenopathy.  Genitourinary:  Musculoskeletal: Full ROM all peripheral extremities, joint stability, 5/5 strength, and normal gait. Skin: Warm and dry without rashes, lesions, cyanosis, clubbing or  ecchymosis.  Neuro: Cranial nerves intact, reflexes equal bilaterally. Normal muscle tone, no cerebellar symptoms. Sensation intact.  Pysch: Alert and oriented X 3, normal affect, Insight and Judgment appropriate.   Assessment and Plan  1. Annual Preventative Screening Examination  2. Essential hypertension  - EKG 12-Lead - Urinalysis, Routine w reflex microscopic - Microalbumin / creatinine urine ratio - CBC with Differential/Platelet - COMPLETE METABOLIC PANEL WITH GFR - Magnesium - TSH  3. Hyperlipidemia, mixed  - EKG 12-Lead - Lipid panel -  TSH  4. Abnormal glucose  - EKG 12-Lead - Hemoglobin A1c - Insulin, random  5. Vitamin D deficiency  - VITAMIN D 25 Hydroxy  6. History of thyroiditis  - TSH  7. Iron deficiency anemia, hx  - COMPLETE METABOLIC PANEL WITH GFR  8. Screening for colorectal cancer  - POC Hemoccult Bld/Stl   9. Screening for ischemic heart disease  - EKG 12-Lead  10. Medication management  - Urinalysis, Routine w reflex microscopic - Microalbumin / creatinine urine ratio - CBC with Differential/Platelet - COMPLETE METABOLIC PANEL WITH GFR - Magnesium - Lipid panel - TSH - Hemoglobin A1c - Insulin, random - VITAMIN D 25 Hydroxy        Patient was counseled in prudent diet to achieve/maintain BMI less than 25 for weight control, BP monitoring, regular exercise and medications. Discussed med's effects and SE's. Screening labs and tests as requested with regular follow-up as recommended. Over 40 minutes of exam, counseling, chart review and high complex critical decision making was performed.   Kirtland Bouchard, MD

## 2019-03-11 NOTE — Patient Instructions (Signed)

## 2019-03-12 ENCOUNTER — Other Ambulatory Visit: Payer: Self-pay

## 2019-03-12 ENCOUNTER — Ambulatory Visit (INDEPENDENT_AMBULATORY_CARE_PROVIDER_SITE_OTHER): Payer: Medicare HMO | Admitting: Internal Medicine

## 2019-03-12 VITALS — BP 114/80 | HR 68 | Temp 96.9°F | Resp 16 | Ht 68.0 in | Wt 145.4 lb

## 2019-03-12 DIAGNOSIS — I10 Essential (primary) hypertension: Secondary | ICD-10-CM

## 2019-03-12 DIAGNOSIS — Z1211 Encounter for screening for malignant neoplasm of colon: Secondary | ICD-10-CM

## 2019-03-12 DIAGNOSIS — Z8639 Personal history of other endocrine, nutritional and metabolic disease: Secondary | ICD-10-CM | POA: Diagnosis not present

## 2019-03-12 DIAGNOSIS — D509 Iron deficiency anemia, unspecified: Secondary | ICD-10-CM

## 2019-03-12 DIAGNOSIS — E559 Vitamin D deficiency, unspecified: Secondary | ICD-10-CM | POA: Diagnosis not present

## 2019-03-12 DIAGNOSIS — Z136 Encounter for screening for cardiovascular disorders: Secondary | ICD-10-CM

## 2019-03-12 DIAGNOSIS — R7303 Prediabetes: Secondary | ICD-10-CM

## 2019-03-12 DIAGNOSIS — Z0000 Encounter for general adult medical examination without abnormal findings: Secondary | ICD-10-CM

## 2019-03-12 DIAGNOSIS — Z79899 Other long term (current) drug therapy: Secondary | ICD-10-CM | POA: Diagnosis not present

## 2019-03-12 DIAGNOSIS — R7309 Other abnormal glucose: Secondary | ICD-10-CM | POA: Diagnosis not present

## 2019-03-12 DIAGNOSIS — E782 Mixed hyperlipidemia: Secondary | ICD-10-CM | POA: Diagnosis not present

## 2019-03-12 DIAGNOSIS — Z0001 Encounter for general adult medical examination with abnormal findings: Secondary | ICD-10-CM

## 2019-03-13 LAB — CBC WITH DIFFERENTIAL/PLATELET
Absolute Monocytes: 307 cells/uL (ref 200–950)
Basophils Absolute: 52 cells/uL (ref 0–200)
Basophils Relative: 0.9 %
Eosinophils Absolute: 122 cells/uL (ref 15–500)
Eosinophils Relative: 2.1 %
HCT: 39.5 % (ref 35.0–45.0)
Hemoglobin: 13 g/dL (ref 11.7–15.5)
Lymphs Abs: 1514 cells/uL (ref 850–3900)
MCH: 30.2 pg (ref 27.0–33.0)
MCHC: 32.9 g/dL (ref 32.0–36.0)
MCV: 91.9 fL (ref 80.0–100.0)
MPV: 11 fL (ref 7.5–12.5)
Monocytes Relative: 5.3 %
Neutro Abs: 3805 cells/uL (ref 1500–7800)
Neutrophils Relative %: 65.6 %
Platelets: 248 10*3/uL (ref 140–400)
RBC: 4.3 10*6/uL (ref 3.80–5.10)
RDW: 11.8 % (ref 11.0–15.0)
Total Lymphocyte: 26.1 %
WBC: 5.8 10*3/uL (ref 3.8–10.8)

## 2019-03-13 LAB — MICROALBUMIN / CREATININE URINE RATIO
Creatinine, Urine: 17 mg/dL — ABNORMAL LOW (ref 20–275)
Microalb Creat Ratio: 12 mcg/mg creat (ref <30)
Microalb, Ur: 0.2 mg/dL

## 2019-03-13 LAB — COMPLETE METABOLIC PANEL WITH GFR
AG Ratio: 1.8 (calc) (ref 1.0–2.5)
ALT: 16 U/L (ref 6–29)
AST: 18 U/L (ref 10–35)
Albumin: 4.6 g/dL (ref 3.6–5.1)
Alkaline phosphatase (APISO): 62 U/L (ref 37–153)
BUN: 16 mg/dL (ref 7–25)
CO2: 30 mmol/L (ref 20–32)
Calcium: 9.9 mg/dL (ref 8.6–10.4)
Chloride: 101 mmol/L (ref 98–110)
Creat: 0.61 mg/dL (ref 0.50–0.99)
GFR, Est African American: 108 mL/min/{1.73_m2} (ref >=60)
GFR, Est Non African American: 93 mL/min/{1.73_m2} (ref >=60)
Globulin: 2.6 g/dL (calc) (ref 1.9–3.7)
Glucose, Bld: 117 mg/dL — ABNORMAL HIGH (ref 65–99)
Potassium: 4.8 mmol/L (ref 3.5–5.3)
Sodium: 139 mmol/L (ref 135–146)
Total Bilirubin: 0.7 mg/dL (ref 0.2–1.2)
Total Protein: 7.2 g/dL (ref 6.1–8.1)

## 2019-03-13 LAB — HEMOGLOBIN A1C
Hgb A1c MFr Bld: 5.8 % of total Hgb — ABNORMAL HIGH (ref <5.7)
Mean Plasma Glucose: 120 (calc)
eAG (mmol/L): 6.6 (calc)

## 2019-03-13 LAB — URINALYSIS, ROUTINE W REFLEX MICROSCOPIC
Bilirubin Urine: NEGATIVE
Glucose, UA: NEGATIVE
Hgb urine dipstick: NEGATIVE
Ketones, ur: NEGATIVE
Leukocytes,Ua: NEGATIVE
Nitrite: NEGATIVE
Protein, ur: NEGATIVE
Specific Gravity, Urine: 1.005 (ref 1.001–1.03)
pH: 6.5 (ref 5.0–8.0)

## 2019-03-13 LAB — TSH: TSH: 4.28 mIU/L (ref 0.40–4.50)

## 2019-03-13 LAB — LIPID PANEL
Cholesterol: 184 mg/dL (ref <200)
HDL: 66 mg/dL (ref >=50)
LDL Cholesterol (Calc): 96 mg/dL (calc)
Non-HDL Cholesterol (Calc): 118 mg/dL (calc) (ref <130)
Total CHOL/HDL Ratio: 2.8 (calc) (ref <5.0)
Triglycerides: 123 mg/dL (ref <150)

## 2019-03-13 LAB — INSULIN, RANDOM: Insulin: 31.9 u[IU]/mL — ABNORMAL HIGH

## 2019-03-13 LAB — MAGNESIUM: Magnesium: 1.9 mg/dL (ref 1.5–2.5)

## 2019-03-13 LAB — VITAMIN D 25 HYDROXY (VIT D DEFICIENCY, FRACTURES): Vit D, 25-Hydroxy: 67 ng/mL (ref 30–100)

## 2019-03-19 ENCOUNTER — Ambulatory Visit (INDEPENDENT_AMBULATORY_CARE_PROVIDER_SITE_OTHER): Payer: Medicare HMO | Admitting: Adult Health

## 2019-03-19 ENCOUNTER — Encounter: Payer: Self-pay | Admitting: Adult Health

## 2019-03-19 ENCOUNTER — Other Ambulatory Visit: Payer: Self-pay

## 2019-03-19 ENCOUNTER — Emergency Department (HOSPITAL_COMMUNITY)
Admission: EM | Admit: 2019-03-19 | Discharge: 2019-03-19 | Disposition: A | Discharge status: Home / Self Care | Payer: Medicare HMO | Attending: Emergency Medicine | Admitting: Emergency Medicine

## 2019-03-19 ENCOUNTER — Emergency Department (HOSPITAL_COMMUNITY): Payer: Medicare HMO

## 2019-03-19 VITALS — BP 142/86 | HR 110 | Temp 97.7°F | Wt 145.6 lb

## 2019-03-19 DIAGNOSIS — Z79899 Other long term (current) drug therapy: Secondary | ICD-10-CM | POA: Insufficient documentation

## 2019-03-19 DIAGNOSIS — R0989 Other specified symptoms and signs involving the circulatory and respiratory systems: Secondary | ICD-10-CM | POA: Diagnosis not present

## 2019-03-19 DIAGNOSIS — R059 Cough, unspecified: Secondary | ICD-10-CM

## 2019-03-19 DIAGNOSIS — R000 Tachycardia, unspecified: Secondary | ICD-10-CM | POA: Diagnosis not present

## 2019-03-19 DIAGNOSIS — I10 Essential (primary) hypertension: Secondary | ICD-10-CM | POA: Insufficient documentation

## 2019-03-19 DIAGNOSIS — R0602 Shortness of breath: Secondary | ICD-10-CM | POA: Insufficient documentation

## 2019-03-19 DIAGNOSIS — R0902 Hypoxemia: Secondary | ICD-10-CM

## 2019-03-19 DIAGNOSIS — I499 Cardiac arrhythmia, unspecified: Secondary | ICD-10-CM

## 2019-03-19 DIAGNOSIS — R05 Cough: Secondary | ICD-10-CM | POA: Diagnosis present

## 2019-03-19 DIAGNOSIS — Z7982 Long term (current) use of aspirin: Secondary | ICD-10-CM | POA: Insufficient documentation

## 2019-03-19 DIAGNOSIS — R9431 Abnormal electrocardiogram [ECG] [EKG]: Secondary | ICD-10-CM | POA: Diagnosis not present

## 2019-03-19 LAB — BASIC METABOLIC PANEL
Anion gap: 12 (ref 5–15)
BUN: 11 mg/dL (ref 8–23)
CO2: 28 mmol/L (ref 22–32)
Calcium: 10 mg/dL (ref 8.9–10.3)
Chloride: 101 mmol/L (ref 98–111)
Creatinine, Ser: 0.54 mg/dL (ref 0.44–1.00)
GFR calc Af Amer: >60 mL/min (ref >60)
GFR calc non Af Amer: >60 mL/min (ref >60)
Glucose, Bld: 111 mg/dL — ABNORMAL HIGH (ref 70–99)
Potassium: 5.1 mmol/L (ref 3.5–5.1)
Sodium: 141 mmol/L (ref 135–145)

## 2019-03-19 LAB — CBC
HCT: 42.2 % (ref 36.0–46.0)
Hemoglobin: 13.8 g/dL (ref 12.0–15.0)
MCH: 30.9 pg (ref 26.0–34.0)
MCHC: 32.7 g/dL (ref 30.0–36.0)
MCV: 94.6 fL (ref 80.0–100.0)
Platelets: 314 10*3/uL (ref 150–400)
RBC: 4.46 MIL/uL (ref 3.87–5.11)
RDW: 11.9 % (ref 11.5–15.5)
WBC: 14.1 10*3/uL — ABNORMAL HIGH (ref 4.0–10.5)
nRBC: 0 % (ref 0.0–0.2)

## 2019-03-19 LAB — D-DIMER, QUANTITATIVE: D-Dimer, Quant: <0.27 ug/mL-FEU (ref 0.00–0.50)

## 2019-03-19 LAB — TROPONIN I (HIGH SENSITIVITY)
Troponin I (High Sensitivity): 7 ng/L (ref <18)
Troponin I (High Sensitivity): 6 ng/L (ref <18)

## 2019-03-19 MED ORDER — SODIUM CHLORIDE 0.9% FLUSH: 3.0000 mL | Freq: Once | INTRAVENOUS | Status: DC

## 2019-03-19 NOTE — ED Triage Notes (Signed)
Pt went to see primary care provider today due to a "rattling" she felt in her chest since Saturday worse with lying down- pt states she has had a non productive cough. Pt denies cp or sob but was sent to ED to r/o ACS and ST depression per note MD sent with her.

## 2019-03-19 NOTE — Patient Instructions (Signed)
Your heart rate was elevated and you had a low oxygen level of 93% today   I am concerned about possible clot in lung, heart problem, covid 19, pneumonia, etc  Please call to schedule a covid 19 test ASAP- [place] # [id number]       Hypoxia Hypoxia is a condition that happens when there is a lack of oxygen in the body's tissues and organs. When there is not enough oxygen, organs cannot work as they should. This causes serious problems throughout the body and in the brain. What are the causes? This condition may be caused by:  Exposure to high altitude.  A collapsed lung (pneumothorax).  Lung infection (pneumonia).  Lung injury.  Long-term (chronic) lung disease, such as COPD (chronic obstructive pulmonary disease).  Blood collecting in the chest cavity (hemothorax).  Food, saliva, or vomit getting into the airway (aspiration).  Reduced blood flow (ischemia).  Severe blood loss.  Slow or shallow breathing (hypoventilation).  Blood disorders, such as anemia.  Carbon monoxide poisoning.  The heart suddenly stopping (cardiac arrest).  Anesthetic medicines.  Drowning.  Choking. What are the signs or symptoms? Symptoms of this condition include:  Headache.  Fatigue.  Drowsiness.  Forgetfulness.  Nausea.  Confusion.  Shortness of breath.  Dizziness.  Bluish color of the skin, lips, or nail beds (cyanosis).  Change in consciousness or awareness. If hypoxia is not treated, it can lead to convulsions, loss of consciousness (coma), or brain damage. How is this diagnosed? This condition may be diagnosed based on:  A physical exam.  Blood tests.  A test that measures how much oxygen is in your blood (pulse oximetry). This is done with a sensor that is placed on your finger, toe, or earlobe.  Chest X-ray.  Tests to check your lung function (pulmonary function tests).  A test to check the electrical activity of your heart (electrocardiogram,  ECG). You may have other tests to determine the cause of your hypoxia. How is this treated?  Treatment for this condition depends on what is causing the hypoxia. You will likely be treated with oxygen therapy. This may be done by giving you oxygen through a face mask or through tubes in your nose. Your health care provider may also recommend other therapies to treat the underlying cause of your hypoxia. Follow these instructions at home:  Take over-the-counter and prescription medicines only as told by your health care provider.  Do not use any products that contain nicotine or tobacco, such as cigarettes and e-cigarettes. If you need help quitting, ask your health care provider.  Avoid secondhand smoke.  Work with your health care provider to manage any chronic conditions you have that may be causing hypoxia, such as COPD.  Keep all follow-up visits as told by your health care provider. This is important. Contact a health care provider if:  You have a fever.  You have trouble breathing, even after treatment.  You become extremely short of breath when you exercise. Get help right away if:  Your shortness of breath gets worse, especially with normal or very little activity.  Your skin, lips, or nail beds have a bluish color.  You become confused or you cannot think properly.  You have chest pain. Summary  Hypoxia is a condition that happens when there is a lack of oxygen in the body's tissues and organs.  If hypoxia is not treated, it can lead to convulsions, loss of consciousness (coma), or brain damage.  Symptoms  of hypoxia can include a headache, shortness of breath, confusion, nausea, and a bluish skin color.  Hypoxia has many possible causes, including exposure to high altitude, carbon monoxide poisoning, or other health issues, such as blood disorders or cardiac arrest.  Hypoxia is usually treated with oxygen therapy. This information is not intended to replace advice  given to you by your health care provider. Make sure you discuss any questions you have with your health care provider. Document Revised: 12/17/2016 Document Reviewed: 02/23/2016 Elsevier Patient Education  2020 [employer].

## 2019-03-19 NOTE — Discharge Instructions (Addendum)
Your heart enzymes today, troponins, were not elevated. There are also no signs that you have a blood clot in your lungs. Your EKG looks similar to your prior EKG. Follow-up with your primary care provider. You can take cough medication or medication such as Pepcid to see if they help with your symptoms. Return to the ED if you start to have worsening chest pain, shortness of breath, leg swelling, vomiting or coughing up blood.

## 2019-03-19 NOTE — Progress Notes (Signed)
Assessment and Plan:  Brock was seen today for acute visit.  Diagnoses and all orders for this visit:  Chest congestion/ tachycardia, hypoxia, irregular heart rhythmn Patient presented for evaluation of chest congestion Note hypoxia and persistent tachycardia ranging ~122 bpm while in office EKG with new inferio/lateral Q waves, ? ST dep in several leads, concern for demand ischemia, reviewed with Dr. Melford Aase and in agreement Cannot r/o PE, ACS Discussed urgent outpatient cardiology vs ER to also r/o PE She is here with husband, in agreement to present to ED for above urgent workup Declines EMS, departed accompanied by husband in stable condition to [facility] Given copy of concerning EKG, fax sent ahead notifying of impending arrival  -     Cancel: CBC with Differential/Platelet -     Cancel: COMPLETE METABOLIC PANEL WITH GFR -     DG Chest 2 View; Future -     Cancel: D-dimer, quantitative (not at [facility]) -     EKG 12-Lead  Further disposition pending results of labs. Discussed med's effects and SE's.   Over 30 minutes of exam, counseling, chart review, and critical decision making was performed.   Future Appointments  Date Time Provider [place]  03/21/2019  8:30 AM [facility] DX DEXA 1 [facility]  06/22/2019 10:30 AM Vicie Mutters, PA-C [facility] None  09/26/2019 10:30 AM Unk Pinto, MD [facility] None  12/28/2019  9:30 AM Regina Eck, CNM [place] None  03/19/2020 11:00 AM Unk Pinto, MD [facility] None    ------------------------------------------------------------------------------------------------------------------   HPI BP (!) 142/86   Pulse (!) 110   Temp 97.7 F (36.5 C)   Wt 145 lb 9.6 oz (66 kg)   LMP 11/17/2002   SpO2 93%   BMI 22.14 kg/m   69 y.o.female with hx of htn, hyperlipidemia, thyroiditis presents for evaluation due to persistent chest congestion. Initial heart rate 119, O2 on RA at rest 92%; monitored for duration  of visit, ranges 98-122, O2 ranges 93-94% persistently. Does not appear in acute distress, speaks in complete sentences.   She reports 2 days ago Saturday evening she started having a sensation of needing to clear her throat when lying, sense of chest congestion and clearing throat just when she lies down. Thought it was allergies, took zicam nasal swabs for this which did seem to improve her symptoms. Presents "to be sure" due to persistent sense of chest congestion.   She denies fever/chills, normal temp this AM at home, denies notable cough, does endorse sense of congestion in her chest, feels like rattling but only with lying down, denies chest pain/aching, tightness, palpitations, fatigue, dizziness, neck/arm pain, nausea.   Denies nasal congestion, headache, itching eyes  Endorses mild post-nasal drip with lying down  Denies changes in taste or smell  Hasn't had the covid 19 vaccine, staying at home other than to get groceries, retired. Practicing social distancing and wearing mask, no known sick contacts.   She is on bASA, not on HRT, no hx of clots, no recent travel. Never smoker. 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   Hx of thyroiditis, no recent issues, had normal recent check. Denies heat/cold intolerance, insomnia (other than from throat clearing, has been sleeping with head up), palpitations, anxiety.  Lab Results  Component Value Date   TSH 4.28 03/12/2019     Past Medical History:  Diagnosis Date  . Abnormal Pap smear of cervix   . Acute lower GI bleeding 02/16/2016  .  Heart murmur   . Hyperlipidemia   . Hypertension   . Prediabetes   . Seasonal allergies   . Vitamin D deficiency      Allergies  Allergen Reactions  . Amoxil [Amoxicillin] Rash  . Ampicillin Rash    Current Outpatient Medications on File Prior to Visit  Medication Sig  . acetaminophen (TYLENOL) 325 MG tablet Take 2 tablets (650 mg total) by mouth every 6 (six) hours as needed for  mild pain (or Fever >/= 101).  Marland Kitchen amLODipine (NORVASC) 2.5 MG tablet Take 1 tablet Daily for BP  . Ascorbic Acid (VITAMIN C) 1000 MG tablet Take 1,000 mg by mouth daily.  Marland Kitchen aspirin EC 81 MG tablet Take 81 mg by mouth daily.  . calcium carbonate (OS-CAL) 600 MG TABS tablet Take 600 mg by mouth daily with breakfast.  . Cholecalciferol (VITAMIN D PO) Take 5,000 Units by mouth every morning.   . fish oil-omega-3 fatty acids 1000 MG capsule Take 1 g by mouth 3 (three) times daily with meals.   . Flaxseed, Linseed, (FLAXSEED OIL PO) Take 1 tablet by mouth 3 (three) times daily with meals.   . meloxicam (MOBIC) 15 MG tablet Take 1/2 to 1 tablet Daily with Food for Pain & Inflammation  . Multiple Vitamin (MULTIVITAMIN) tablet Take 1 tablet by mouth every morning.   . zinc gluconate 50 MG tablet Take 50 mg by mouth daily.   No current facility-administered medications on file prior to visit.    ROS: all negative except above  Physical Exam:  BP (!) 142/86   Pulse (!) 110   Temp 97.7 F (36.5 C)   Wt 145 lb 9.6 oz (66 kg)   LMP 11/17/2002   SpO2 93%   BMI 22.14 kg/m   General Appearance: Well nourished, in no apparent distress. Eyes: PERRLA, EOMs, conjunctiva no swelling or erythema Sinuses: No Frontal/maxillary tenderness ENT/Mouth: Ext aud canals clear, TMs without erythema, bulging. No erythema, swelling, or exudate on post pharynx.  Tonsils not swollen or erythematous. Hearing normal.  Neck: Supple, thyroid normal.  Respiratory: Respiratory effort normal, BS equal bilaterally without rales, rhonchi, wheezing or stridor. No tachypnea, accessory muscle use.  Cardio: Irregular heart rhythm MRGs. Brisk peripheral pulses without edema. No JVD.  Abdomen: Soft, + BS.  Non tender, no guarding, rebound, hernias, masses. Lymphatics: Non tender without lymphadenopathy.  Musculoskeletal: Full ROM, 5/5 strength, normal gait.  Skin: Warm, dry without rashes, lesions, ecchymosis.  Neuro: Cranial  nerves intact. Normal muscle tone, no cerebellar symptoms. Sensation intact.  Psych: Awake and oriented X 3, normal affect, Insight and Judgment appropriate.     Izora Ribas, NP 12:30 PM [facility]

## 2019-03-19 NOTE — ED Provider Notes (Signed)
[facility] DEPARTMENT Provider Note   CSN: [id number] Arrival date & time: 03/19/19  1241     History Chief Complaint  Patient presents with  . Abnormal ECG    [name] [name] is a 69 y.o. female with a past medical history of hypertension, hyperlipidemia presenting to the ED with a chief complaint of abnormal EKG.  States that she was seen and evaluated by her PCP earlier today due to a "rattling in my chest" since 03/16/2019.  States that she feels like she needs to clear her throat and she has a cough associated with this.  Reports a dry cough.  It is worse when she lays down.  PCP was concerned due to her ST changes on her EKG.  She denies any chest pain, shortness of breath, leg swelling.  She was found to be hypoxic, tachycardic in PCPs office and was concerned for rule out ACS versus PE.  Patient denies any leg swelling, recent immobilization, history of DVT, PE, MI, hemoptysis, fever, sick contacts with similar symptoms, abdominal pain, vomiting.  HPI     Past Medical History:  Diagnosis Date  . Abnormal Pap smear of cervix   . Acute lower GI bleeding 02/16/2016  . Heart murmur   . Hyperlipidemia   . Hypertension   . Prediabetes   . Seasonal allergies   . Vitamin D deficiency     Patient Active Problem List   Diagnosis Date Noted  . Abnormal glucose 08/20/2018  . History of thyroiditis 03/26/2018  . Osteopenia of left femoral neck 03/23/2018  . Cystocele with prolapse 03/23/2018  . BMI 22.0-22.9, adult 06/01/2017  . Anemia, iron deficiency 10/16/2015  . Medication management 07/26/2014  . Hyperlipidemia, mixed   . Seasonal allergies   . Vitamin D deficiency   . Prediabetes   . Hypertension   . History of colonic polyps 03/23/2011    Past Surgical History:  Procedure Laterality Date  . CARDIAC SURGERY  1958   at age 13 for hole in heart  . CERVIX LESION DESTRUCTION N/A ? years ago   with abnormal pap  . COLONOSCOPY     polyps  .  COLONOSCOPY N/A 02/16/2016   Procedure: COLONOSCOPY;  Surgeon: Ladene Artist, MD;  Location: [facility];  Service: Endoscopy;  Laterality: N/A;  . CRYOTHERAPY     for abnormal pap smear  . TUBAL LIGATION       OB History    Gravida  2   Para  2   Term  2   Preterm      AB      Living  2     SAB      TAB      Ectopic      Multiple      Live Births              Family History  Problem Relation Age of Onset  . Colon cancer Sister 3  . Diabetes Sister   . Diabetes Father   . Diabetes Brother   . Diabetes Sister   . Stomach cancer Neg Hx   . Breast cancer Neg Hx     Social History   Tobacco Use  . Smoking status: Never Smoker  . Smokeless tobacco: Never Used  Substance Use Topics  . Alcohol use: Yes    Alcohol/week: 0.0 - 7.0 standard drinks  . Drug use: No    Home Medications Prior to Admission medications   Medication  Sig Start Date End Date Taking? Authorizing Provider  acetaminophen (TYLENOL) 325 MG tablet Take 2 tablets (650 mg total) by mouth every 6 (six) hours as needed for mild pain (or Fever >/= 101). 02/18/16  Yes Regalado, Belkys A, MD  amLODipine (NORVASC) 2.5 MG tablet Take 1 tablet Daily for BP Patient taking differently: Take 2.5 mg by mouth daily.  02/23/19  Yes Unk Pinto, MD  Ascorbic Acid (VITAMIN C) 1000 MG tablet Take 1,000 mg by mouth daily.   Yes [provider]  aspirin EC 81 MG tablet Take 81 mg by mouth every other day.    Yes [provider]  calcium carbonate (OS-CAL) 600 MG TABS tablet Take 600 mg by mouth daily with breakfast.   Yes [provider]  Cholecalciferol (VITAMIN D3) 125 MCG (5000 UT) CAPS Take 5,000 Units by mouth daily.   Yes [provider]  fish oil-omega-3 fatty acids 1000 MG capsule Take 1 g by mouth 3 (three) times daily with meals.    Yes [provider]  Flaxseed, Linseed, (FLAXSEED OIL PO) Take 1 capsule by mouth 3 (three) times daily with meals.     Yes [provider]  meloxicam (MOBIC) 15 MG tablet Take 1/2 to 1 tablet Daily with Food for Pain & Inflammation Patient taking differently: Take 15 mg by mouth daily as needed for pain (and/or inflammation).  02/12/19  Yes Unk Pinto, MD  Multiple Vitamin (MULTIVITAMIN) tablet Take 1 tablet by mouth every morning.    Yes [provider]  zinc gluconate 50 MG tablet Take 50 mg by mouth daily.   Yes [provider]    Allergies    Amoxil [amoxicillin] and Ampicillin  Review of Systems   Review of Systems  Constitutional: Negative for appetite change, chills and fever.  HENT: Negative for ear pain, rhinorrhea, sneezing and sore throat.   Eyes: Negative for photophobia and visual disturbance.  Respiratory: Positive for cough and shortness of breath. Negative for chest tightness and wheezing.   Cardiovascular: Negative for chest pain and palpitations.  Gastrointestinal: Negative for abdominal pain, blood in stool, constipation, diarrhea, nausea and vomiting.  Genitourinary: Negative for dysuria, hematuria and urgency.  Musculoskeletal: Negative for myalgias.  Skin: Negative for rash.  Neurological: Negative for dizziness, weakness and light-headedness.    Physical Exam Updated Vital Signs BP 130/70   Pulse 72   Temp 100 F (37.8 C) (Oral)   Resp 15   LMP 11/17/2002   SpO2 99%   Physical Exam Vitals and nursing note reviewed.  Constitutional:      General: She is not in acute distress.    Appearance: She is well-developed.     Comments: Speaking in complete sentences without difficulty.  HENT:     Head: Normocephalic and atraumatic.     Nose: Nose normal.  Eyes:     General: No scleral icterus.       Left eye: No discharge.     Conjunctiva/sclera: Conjunctivae normal.  Cardiovascular:     Rate and Rhythm: Normal rate and regular rhythm.     Heart sounds: Normal heart sounds. No murmur. No friction rub. No gallop.   Pulmonary:     Effort:  Pulmonary effort is normal. No respiratory distress.     Breath sounds: Normal breath sounds.  Abdominal:     General: Bowel sounds are normal. There is no distension.     Palpations: Abdomen is soft.     Tenderness: There is no  abdominal tenderness. There is no guarding.  Musculoskeletal:        General: No swelling or tenderness. Normal range of motion.     Cervical back: Normal range of motion and neck supple.     Right lower leg: No edema.     Left lower leg: No edema.  Skin:    General: Skin is warm and dry.     Findings: No rash.  Neurological:     Mental Status: She is alert.     Motor: No abnormal muscle tone.     Coordination: Coordination normal.     ED Results / Procedures / Treatments   Labs (all labs ordered are listed, but only abnormal results are displayed) Labs Reviewed  BASIC METABOLIC PANEL - Abnormal; Notable for the following components:      Result Value   Glucose, Bld 111 (*)    All other components within normal limits  CBC - Abnormal; Notable for the following components:   WBC 14.1 (*)    All other components within normal limits  D-DIMER, QUANTITATIVE (NOT AT [facility])  TROPONIN I (HIGH SENSITIVITY)  TROPONIN I (HIGH SENSITIVITY)    EKG EKG Interpretation  Date/Time:  Monday March 19 2019 12:46:34 EST Ventricular Rate:  98 PR Interval:  140 QRS Duration: 92 QT Interval:  358 QTC Calculation: 457 R Axis:   43 Text Interpretation: Normal sinus rhythm with sinus arrhythmia Possible Inferior infarct , age undetermined Possible Anterolateral infarct , age undetermined Abnormal ECG When compared to prior, slightly faster rate No STEMI Confirmed by Antony Blackbird [phone]) on 03/19/2019 6:50:22 PM   Radiology DG Chest 2 View  Result Date: 03/19/2019 CLINICAL DATA:  Shortness of breath when lying down for one week, abnormal EKG today, heart murmur, hypertension, hyperlipidemia EXAM: CHEST - 2 VIEW COMPARISON:  None FINDINGS: Normal heart size,  mediastinal contours, and pulmonary vascularity. Atherosclerotic calcification aorta. Tiny calcified granuloma RIGHT upper lobe. Lungs otherwise clear. No pulmonary infiltrate, pleural effusion or pneumothorax. Bones demineralized. IMPRESSION: No acute abnormalities. Electronically Signed   By: Lavonia Dana M.D.   On: 03/19/2019 13:15    Procedures Procedures (including critical care time)  Medications Ordered in ED Medications  sodium chloride flush (NS) 0.9 % injection 3 mL (has no administration in time range)    ED Course  I have reviewed the triage vital signs and the nursing notes.  Pertinent labs & imaging results that were available during my care of the patient were reviewed by me and considered in my medical decision making (see chart for details).  Clinical Course as of Mar 18 2017  Mon Mar 19, 2019  2015 D-Dimer, Quant: <0.27 [HK]  2015 Troponin I (High Sensitivity): 6 [HK]    Clinical Course User Index [HK] Delia Heady, PA-C   MDM Rules/Calculators/A&P                      69 year old female with past medical history of hypertension, hyperlipidemia presents to the ED with a chief complaint of abnormal EKG.  She has been having a "rattling in my chest" for the past 2 to 3 days and feels like she needs to clear her throat with a cough associated with this.  She was seen and evaluated by her PCP, concern for ST changes on her EKG.  She denies any chest pain, shortness of breath or leg swelling.  She was sent to rule out ACS or PE.  On my exam patient is  overall well-appearing.  She is speaking complete sentences without difficulty.  Chest is not tender to palpation, there is no leg swelling or calf tenderness that concern me for DVT.  She is not hypoxic or tachycardic here throughout her ER visit.  EKG looks similar to prior tracings with slightly faster rate.  Initial delta troponin are both negative.  D-dimer is negative.  CBC, BMP are unremarkable.  Chest x-ray is negative for  acute abnormality.  Patient was observed here without any chest pain or shortness of breath.  Unsure of the cause of her symptoms but able to rule out ACS, PE and her vital signs have been stable here.  Did advise her to take cough medications or reflux medications as needed as these may help with her symptoms.  She is comfortable with discharge home with PCP follow-up. Patient discussed with my attending, Dr. Sherry Ruffing.  Patient is hemodynamically stable, in NAD, and able to ambulate in the ED. Evaluation does not show pathology that would require ongoing emergent intervention or inpatient treatment. I have personally reviewed and interpreted all lab work and imaging at today's ED visit. I explained the diagnosis to the patient. Pain has been managed and has no complaints prior to discharge. Patient is comfortable with above plan and is stable for discharge at this time. All questions were answered prior to disposition. Strict return precautions for returning to the ED were discussed. Encouraged follow up with PCP.   An After Visit Summary was printed and given to the patient.   Portions of this note were generated with [employer]. Dictation errors may occur despite best attempts at proofreading.  Final Clinical Impression(s) / ED Diagnoses Final diagnoses:  Cough    Rx / DC Orders ED Discharge Orders    None       Delia Heady, PA-C 03/19/19 2019    Tegeler, Gwenyth Allegra, MD 03/20/19 [phone]

## 2019-03-20 ENCOUNTER — Other Ambulatory Visit: Payer: Self-pay | Admitting: Adult Health

## 2019-03-20 MED ORDER — PROMETHAZINE-DM 6.25-15 MG/5ML PO SYRP: 5.0000 mL | ORAL_SOLUTION | Freq: Four times a day (QID) | ORAL | 1 refills | Status: DC | PRN

## 2019-03-21 ENCOUNTER — Other Ambulatory Visit: Payer: Self-pay

## 2019-03-21 ENCOUNTER — Ambulatory Visit
Admission: RE | Admit: 2019-03-21 | Discharge: 2019-03-21 | Disposition: A | Discharge status: Home / Self Care | Payer: Medicare HMO | Source: Ambulatory Visit | Attending: Certified Nurse Midwife | Admitting: Certified Nurse Midwife

## 2019-03-21 DIAGNOSIS — Z780 Asymptomatic menopausal state: Secondary | ICD-10-CM | POA: Diagnosis not present

## 2019-03-21 DIAGNOSIS — Z8739 Personal history of other diseases of the musculoskeletal system and connective tissue: Secondary | ICD-10-CM

## 2019-03-21 DIAGNOSIS — M85852 Other specified disorders of bone density and structure, left thigh: Secondary | ICD-10-CM | POA: Diagnosis not present

## 2019-04-10 ENCOUNTER — Encounter: Payer: Self-pay | Admitting: Certified Nurse Midwife

## 2019-06-20 NOTE — Progress Notes (Deleted)
MEDICARE ANNUAL WELLNESS VISIT AND FOLLOW UP  THIS ENCOUNTER IS A VIRTUAL/TELEVIDEO VISIT DUE TO COVID-19 - PATIENT WAS NOT SEEN IN THE OFFICE.  PATIENT HAS CONSENTED TO VIRTUAL VISIT / TELEVIDEO VISIT  This provider placed a call to [name] [name], her appointment was changed to a virtual office visit to reduce the risk of exposure to the COVID-19 virus and to help [name] [name] remain healthy and safe. The virtual visit will also provide continuity of care. She verbalizes understanding.    Assessment:   BMI 22.0-22.9, adult Monitor  Essential hypertension - continue medications, DASH diet, exercise and monitor at home. Call if greater than 130/80.   Hyperlipidemia, unspecified hyperlipidemia type -continue medications, check lipids, decrease fatty foods, increase activity.   Iron deficiency anemia, unspecified iron deficiency anemia type Monitor CBC  Medication management  Vitamin D deficiency Continue supplement  Seasonal allergies Continue allergy pill  History of colonic polyps UTD  Encounter for Medicare annual wellness exam 1 year  Sinus bradycardia On BB however patient is not having any symptoms and wishes to remain on the medication at this time, discussed a long time about switching, she will monitor her heart rate and message or call if she has any symptoms.   Over 30 minutes of exam, counseling, chart review and critical decision making was performed Future Appointments  Date Time Provider [place]  06/22/2019 10:30 AM [name] Mutters, PA-C [facility] None  09/26/2019 11:00 AM Unk Pinto, MD [facility] None  03/19/2020 11:00 AM Unk Pinto, MD [facility] None     Plan:   During the course of the visit the patient was educated and counseled about appropriate screening and preventive services including:    Pneumococcal vaccine   Prevnar 13  Influenza vaccine  Td vaccine  Screening electrocardiogram  Bone densitometry  screening  Colorectal cancer screening  Diabetes screening  Glaucoma screening  Nutrition counseling   Advanced directives: requested   Subjective:  [name] [name] is a 69 y.o. female who presents for Medicare Annual Wellness Visit and 3 month follow up.   She was seen on 03/01 for congestion, had possible abnormal EKG so was referred to ER to rule out PE/ACS- had negative Ddimer, negative troponin there and normal eKG.   Her blood pressure has been controlled at home, today their BP is   She does workout. She denies chest pain, shortness of breath, dizziness.   BMI is There is no height or weight on file to calculate BMI., she is working on diet and exercise. Wt Readings from Last 3 Encounters:  03/19/19 145 lb 9.6 oz (66 kg)  03/12/19 145 lb 6.4 oz (66 kg)  12/26/18 144 lb (65.3 kg)   She is on cholesterol medication and denies myalgias. Her cholesterol is at goal. The cholesterol last visit was:   Lab Results  Component Value Date   CHOL 184 03/12/2019   HDL 66 03/12/2019   LDLCALC 96 03/12/2019   TRIG 123 03/12/2019   CHOLHDL 2.8 03/12/2019    Last A1C in the office was:  Lab Results  Component Value Date   HGBA1C 5.8 (H) 03/12/2019   Last GFR: Lab Results  Component Value Date   GFRNONAA >60 03/19/2019   Patient is on Vitamin D supplement.   Lab Results  Component Value Date   VD25OH 67 03/12/2019      Medication Review:   Current Outpatient Medications (Cardiovascular):  .  amLODipine (NORVASC) 2.5 MG tablet, Take 1 tablet Daily for BP (Patient  taking differently: Take 2.5 mg by mouth daily. )  Current Outpatient Medications (Respiratory):  .  promethazine-dextromethorphan (PROMETHAZINE-DM) 6.25-15 MG/5ML syrup, Take 5 mLs by mouth 4 (four) times daily as needed for cough.  Current Outpatient Medications (Analgesics):  .  acetaminophen (TYLENOL) 325 MG tablet, Take 2 tablets (650 mg total) by mouth every 6 (six) hours as needed for mild pain (or  Fever >/= 101). Marland Kitchen  aspirin EC 81 MG tablet, Take 81 mg by mouth every other day.  .  meloxicam (MOBIC) 15 MG tablet, Take 1/2 to 1 tablet Daily with Food for Pain & Inflammation (Patient taking differently: Take 15 mg by mouth daily as needed for pain (and/or inflammation). )   Current Outpatient Medications (Other):  Marland Kitchen  Ascorbic Acid (VITAMIN C) 1000 MG tablet, Take 1,000 mg by mouth daily. .  calcium carbonate (OS-CAL) 600 MG TABS tablet, Take 600 mg by mouth daily with breakfast. .  Cholecalciferol (VITAMIN D3) 125 MCG (5000 UT) CAPS, Take 5,000 Units by mouth daily. .  fish oil-omega-3 fatty acids 1000 MG capsule, Take 1 g by mouth 3 (three) times daily with meals.  .  Flaxseed, Linseed, (FLAXSEED OIL PO), Take 1 capsule by mouth 3 (three) times daily with meals.  .  Multiple Vitamin (MULTIVITAMIN) tablet, Take 1 tablet by mouth every morning.  .  zinc gluconate 50 MG tablet, Take 50 mg by mouth daily.  Allergies Allergies  Allergen Reactions  . Amoxil [Amoxicillin] Rash  . Ampicillin Rash    Current Problems (verified) Patient Active Problem List   Diagnosis Date Noted  . Abnormal glucose 08/20/2018  . History of thyroiditis 03/26/2018  . Osteopenia of left femoral neck 03/23/2018  . Cystocele with prolapse 03/23/2018  . BMI 22.0-22.9, adult 06/01/2017  . Anemia, iron deficiency 10/16/2015  . Medication management 07/26/2014  . Hyperlipidemia, mixed   . Seasonal allergies   . Vitamin D deficiency   . Prediabetes   . Hypertension   . History of colonic polyps 03/23/2011    Screening Tests Immunization History  Administered Date(s) Administered  . Influenza Split 01/03/2013  . Influenza, High Dose Seasonal PF 10/23/2015, 11/08/2016, 11/25/2017, 10/04/2018  . PPD Test 07/13/2013  . Pneumococcal Conjugate-13 01/08/2016  . Pneumococcal Polysaccharide-23 12/19/2017  . Pneumococcal-Unspecified 01/18/2002  . Tdap 01/19/2003, 07/05/2012  . Zoster 07/05/2012   Health  Maintenance  Topic Date Due  . COVID-19 Vaccine (1) Never done  . INFLUENZA VACCINE  08/19/2019  . MAMMOGRAM  01/29/2020  . COLONOSCOPY  02/15/2021  . TETANUS/TDAP  07/06/2022  . DEXA SCAN  Completed  . Hepatitis C Screening  Completed  . PNA vac Low Risk Adult  Completed   Preventative care: Last colonoscopy: 01/2016 Last mammogram: 01/2018 Last pap smear/pelvic exam: 2015 DEXA: 01/2016  Prior vaccinations: TD or Tdap: 2014  Influenza: 2019 Pneumococcal: 2019 Prevnar13: 2017 Shingles/Zostavax: 2014 Shingrax- has not had yet- discussed  Names of Other Physician/Practitioners you currently use: 1. [facility] here for primary care Patient Care Team: Unk Pinto, MD as PCP - General (Internal Medicine)  SURGICAL HISTORY She  has a past surgical history that includes Cardiac surgery (1958); Colonoscopy; Cervix lesion destruction (N/A, ? years ago); Tubal ligation; Colonoscopy (N/A, 02/16/2016); and Cryotherapy. FAMILY HISTORY Her family history includes Colon cancer (age of onset: 34) in her sister; Diabetes in her brother, father, sister, and sister. SOCIAL HISTORY She  reports that she has never smoked. She has never used smokeless tobacco. She  reports current alcohol use. She reports that she does not use drugs.  MEDICARE WELLNESS OBJECTIVES: Physical activity:   Cardiac risk factors:   Depression/mood screen:   Depression screen [facility] 2/9 03/11/2019  Decreased Interest 0  Down, Depressed, Hopeless 0  PHQ - 2 Score 0    ADLs:  In your present state of health, do you have any difficulty performing the following activities: 03/11/2019 08/20/2018  Hearing? N N  Vision? N N  Difficulty concentrating or making decisions? N N  Walking or climbing stairs? N N  Dressing or bathing? N N  Doing errands, shopping? N N  Some recent data might be hidden     Cognitive Testing  Alert? Yes  Normal Appearance?Yes  Oriented to person? Yes   Place? Yes   Time? Yes  Recall of three objects?  Yes  Can perform simple calculations? Yes  Displays appropriate judgment?Yes  Can read the correct time from a watch face?Yes  EOL planning:    Review of Systems  Constitutional: Negative.   HENT: Negative.   Eyes: Negative.   Respiratory: Negative.   Cardiovascular: Negative.   Gastrointestinal: Negative.   Genitourinary: Negative.   Musculoskeletal: Negative.   Skin: Negative.   Neurological: Negative.   Endo/Heme/Allergies: Negative.   Psychiatric/Behavioral: Negative.      Objective:     There were no vitals filed for this visit. There is no height or weight on file to calculate BMI.  General appearance: alert, no distress, WD/WN, female HEENT: normocephalic, sclerae anicteric, TMs pearly, nares patent, no discharge or erythema, pharynx normal Oral cavity: MMM, no lesions Neck: supple, no lymphadenopathy, no thyromegaly, no masses Heart: RRR, normal S1, S2, no murmurs Lungs: CTA bilaterally, no wheezes, rhonchi, or rales Abdomen: +bs, soft, non tender, non distended, no masses, no hepatomegaly, no splenomegaly Musculoskeletal: nontender, no swelling, no obvious deformity Extremities: no edema, no cyanosis, no clubbing Pulses: 2+ symmetric, upper and lower extremities, normal cap refill Neurological: alert, oriented x 3, CN2-12 intact, strength normal upper extremities and lower extremities, sensation normal throughout, DTRs 2+ throughout, no cerebellar signs, gait normal Psychiatric: normal affect, behavior normal, pleasant   Medicare Attestation I have personally reviewed: The patient's medical and social history Their use of alcohol, tobacco or illicit drugs Their current medications and supplements The patient's functional ability including ADLs,fall risks, home safety risks, cognitive, and hearing and visual impairment Diet and physical activities Evidence for depression or mood disorders  The patient's  weight, height, BMI, and visual acuity have been recorded in the chart.  I have made referrals, counseling, and provided education to the patient based on review of the above and I have provided the patient with a written personalized care plan for preventive services.     [name] Mutters, PA-C   06/20/2019

## 2019-06-22 ENCOUNTER — Ambulatory Visit: Payer: Medicare HMO | Admitting: Physician Assistant

## 2019-07-02 NOTE — Progress Notes (Deleted)
MEDICARE ANNUAL WELLNESS VISIT AND FOLLOW UP   Assessment:   BMI 22.0-22.9, adult Monitor  Essential hypertension - continue medications, DASH diet, exercise and monitor at home. Call if greater than 130/80.   Hyperlipidemia, unspecified hyperlipidemia type -continue medications, check lipids, decrease fatty foods, increase activity.   Iron deficiency anemia, unspecified iron deficiency anemia type Monitor CBC  Medication management  Vitamin D deficiency Continue supplement  Seasonal allergies Continue allergy pill  History of colonic polyps UTD  Encounter for Medicare annual wellness exam 1 year  Sinus bradycardia On BB however patient is not having any symptoms and wishes to remain on the medication at this time, discussed a long time about switching, she will monitor her heart rate and message or call if she has any symptoms.   Over 30 minutes of exam, counseling, chart review and critical decision making was performed Future Appointments  Date Time Provider [place]  07/04/2019 11:15 AM Vicie Mutters, PA-C [facility] None  09/26/2019 11:00 AM Unk Pinto, MD [facility] None  03/19/2020 11:00 AM Unk Pinto, MD [facility] None     Plan:   During the course of the visit the patient was educated and counseled about appropriate screening and preventive services including:    Pneumococcal vaccine   Prevnar 13  Influenza vaccine  Td vaccine  Screening electrocardiogram  Bone densitometry screening  Colorectal cancer screening  Diabetes screening  Glaucoma screening  Nutrition counseling   Advanced directives: requested   Subjective:  [name] [name] is a 69 y.o. female who presents for Medicare Annual Wellness Visit and 3 month follow up.   She was seen on 03/01 for congestion, had possible abnormal EKG so was referred to ER to rule out PE/ACS- had negative Ddimer, negative troponin there and normal eKG.   Her blood pressure  has been controlled at home, today their BP is   She does workout. She denies chest pain, shortness of breath, dizziness.   BMI is There is no height or weight on file to calculate BMI., she is working on diet and exercise. Wt Readings from Last 3 Encounters:  03/19/19 145 lb 9.6 oz (66 kg)  03/12/19 145 lb 6.4 oz (66 kg)  12/26/18 144 lb (65.3 kg)   She is on cholesterol medication and denies myalgias. Her cholesterol is at goal. The cholesterol last visit was:   Lab Results  Component Value Date   CHOL 184 03/12/2019   HDL 66 03/12/2019   LDLCALC 96 03/12/2019   TRIG 123 03/12/2019   CHOLHDL 2.8 03/12/2019    Last A1C in the office was:  Lab Results  Component Value Date   HGBA1C 5.8 (H) 03/12/2019   Last GFR: Lab Results  Component Value Date   GFRNONAA >60 03/19/2019   Patient is on Vitamin D supplement.   Lab Results  Component Value Date   VD25OH 67 03/12/2019      Medication Review:   Current Outpatient Medications (Cardiovascular):  .  amLODipine (NORVASC) 2.5 MG tablet, Take 1 tablet Daily for BP (Patient taking differently: Take 2.5 mg by mouth daily. )  Current Outpatient Medications (Respiratory):  .  promethazine-dextromethorphan (PROMETHAZINE-DM) 6.25-15 MG/5ML syrup, Take 5 mLs by mouth 4 (four) times daily as needed for cough.  Current Outpatient Medications (Analgesics):  .  acetaminophen (TYLENOL) 325 MG tablet, Take 2 tablets (650 mg total) by mouth every 6 (six) hours as needed for mild pain (or Fever >/= 101). Marland Kitchen  aspirin EC 81 MG tablet, Take 81  mg by mouth every other day.  .  meloxicam (MOBIC) 15 MG tablet, Take 1/2 to 1 tablet Daily with Food for Pain & Inflammation (Patient taking differently: Take 15 mg by mouth daily as needed for pain (and/or inflammation). )   Current Outpatient Medications (Other):  Marland Kitchen  Ascorbic Acid (VITAMIN C) 1000 MG tablet, Take 1,000 mg by mouth daily. .  calcium carbonate (OS-CAL) 600 MG TABS tablet, Take 600 mg by  mouth daily with breakfast. .  Cholecalciferol (VITAMIN D3) 125 MCG (5000 UT) CAPS, Take 5,000 Units by mouth daily. .  fish oil-omega-3 fatty acids 1000 MG capsule, Take 1 g by mouth 3 (three) times daily with meals.  .  Flaxseed, Linseed, (FLAXSEED OIL PO), Take 1 capsule by mouth 3 (three) times daily with meals.  .  Multiple Vitamin (MULTIVITAMIN) tablet, Take 1 tablet by mouth every morning.  .  zinc gluconate 50 MG tablet, Take 50 mg by mouth daily.  Allergies Allergies  Allergen Reactions  . Amoxil [Amoxicillin] Rash  . Ampicillin Rash    Current Problems (verified) Patient Active Problem List   Diagnosis Date Noted  . Abnormal glucose 08/20/2018  . History of thyroiditis 03/26/2018  . Osteopenia of left femoral neck 03/23/2018  . Cystocele with prolapse 03/23/2018  . BMI 22.0-22.9, adult 06/01/2017  . Anemia, iron deficiency 10/16/2015  . Medication management 07/26/2014  . Hyperlipidemia, mixed   . Seasonal allergies   . Vitamin D deficiency   . Hypertension   . History of colonic polyps 03/23/2011    Screening Tests Immunization History  Administered Date(s) Administered  . Influenza Split 01/03/2013  . Influenza, High Dose Seasonal PF 10/23/2015, 11/08/2016, 11/25/2017, 10/04/2018  . PPD Test 07/13/2013  . Pneumococcal Conjugate-13 01/08/2016  . Pneumococcal Polysaccharide-23 12/19/2017  . Pneumococcal-Unspecified 01/18/2002  . Tdap 01/19/2003, 07/05/2012  . Zoster 07/05/2012   Health Maintenance  Topic Date Due  . COVID-19 Vaccine (1) Never done  . INFLUENZA VACCINE  08/19/2019  . MAMMOGRAM  01/29/2020  . COLONOSCOPY  02/15/2021  . TETANUS/TDAP  07/06/2022  . DEXA SCAN  Completed  . Hepatitis C Screening  Completed  . PNA vac Low Risk Adult  Completed   Preventative care: Last colonoscopy: 01/2016 Last mammogram: 01/2018 Last pap smear/pelvic exam: 2015 DEXA: 01/2016  Prior vaccinations: TD or Tdap: 2014  Influenza: 2019 Pneumococcal:  2019 Prevnar13: 2017 Shingles/Zostavax: 2014 Shingrax- has not had yet- discussed  Names of Other Physician/Practitioners you currently use: 1. [facility] here for primary care Patient Care Team: Unk Pinto, MD as PCP - General (Internal Medicine)  SURGICAL HISTORY She  has a past surgical history that includes Cardiac surgery (1958); Colonoscopy; Cervix lesion destruction (N/A, ? years ago); Tubal ligation; Colonoscopy (N/A, 02/16/2016); and Cryotherapy. FAMILY HISTORY Her family history includes Colon cancer (age of onset: 60) in her sister; Diabetes in her brother, father, sister, and sister. SOCIAL HISTORY She  reports that she has never smoked. She has never used smokeless tobacco. She reports current alcohol use. She reports that she does not use drugs.  MEDICARE WELLNESS OBJECTIVES: Physical activity:   Cardiac risk factors:   Depression/mood screen:   Depression screen [facility] 2/9 03/11/2019  Decreased Interest 0  Down, Depressed, Hopeless 0  PHQ - 2 Score 0    ADLs:  In your present state of health, do you have any difficulty performing the following activities: 03/11/2019 08/20/2018  Hearing? N N  Vision? N N  Difficulty concentrating  or making decisions? N N  Walking or climbing stairs? N N  Dressing or bathing? N N  Doing errands, shopping? N N  Some recent data might be hidden     Cognitive Testing  Alert? Yes  Normal Appearance?Yes  Oriented to person? Yes  Place? Yes   Time? Yes  Recall of three objects?  Yes  Can perform simple calculations? Yes  Displays appropriate judgment?Yes  Can read the correct time from a watch face?Yes  EOL planning:    Review of Systems  Constitutional: Negative.   HENT: Negative.   Eyes: Negative.   Respiratory: Negative.   Cardiovascular: Negative.   Gastrointestinal: Negative.   Genitourinary: Negative.   Musculoskeletal: Negative.   Skin: Negative.   Neurological: Negative.    Endo/Heme/Allergies: Negative.   Psychiatric/Behavioral: Negative.      Objective:     There were no vitals filed for this visit. There is no height or weight on file to calculate BMI.  General appearance: alert, no distress, WD/WN, female HEENT: normocephalic, sclerae anicteric, TMs pearly, nares patent, no discharge or erythema, pharynx normal Oral cavity: MMM, no lesions Neck: supple, no lymphadenopathy, no thyromegaly, no masses Heart: RRR, normal S1, S2, no murmurs Lungs: CTA bilaterally, no wheezes, rhonchi, or rales Abdomen: +bs, soft, non tender, non distended, no masses, no hepatomegaly, no splenomegaly Musculoskeletal: nontender, no swelling, no obvious deformity Extremities: no edema, no cyanosis, no clubbing Pulses: 2+ symmetric, upper and lower extremities, normal cap refill Neurological: alert, oriented x 3, CN2-12 intact, strength normal upper extremities and lower extremities, sensation normal throughout, DTRs 2+ throughout, no cerebellar signs, gait normal Psychiatric: normal affect, behavior normal, pleasant   Medicare Attestation I have personally reviewed: The patient's medical and social history Their use of alcohol, tobacco or illicit drugs Their current medications and supplements The patient's functional ability including ADLs,fall risks, home safety risks, cognitive, and hearing and visual impairment Diet and physical activities Evidence for depression or mood disorders  The patient's weight, height, BMI, and visual acuity have been recorded in the chart.  I have made referrals, counseling, and provided education to the patient based on review of the above and I have provided the patient with a written personalized care plan for preventive services.     Vicie Mutters, PA-C   07/02/2019

## 2019-07-04 ENCOUNTER — Ambulatory Visit: Payer: Medicare HMO | Admitting: Physician Assistant

## 2019-07-12 NOTE — Progress Notes (Signed)
MEDICARE ANNUAL WELLNESS VISIT AND FOLLOW UP   Assessment:   BMI 22.0-22.9, adult Monitor  Essential hypertension - continue medications, DASH diet, exercise and monitor at home. Call if greater than 130/80.   Hyperlipidemia, unspecified hyperlipidemia type -continue medications, check lipids, decrease fatty foods, increase activity.   Iron deficiency anemia, unspecified iron deficiency anemia type Monitor CBC  Medication management  Vitamin D deficiency Continue supplement  Seasonal allergies Continue allergy pill  History of colonic polyps UTD  Encounter for Medicare annual wellness exam 1 year Given information about COVID vaccines and encouraged to get it Given info about shingrix  Right Hip pain  RICE, and exercises given Get Xray If not better with refer to orthopedics.   Over 30 minutes of exam, counseling, chart review and critical decision making was performed Future Appointments  Date Time Provider [place]  09/26/2019 11:00 AM Unk Pinto, MD [facility] None  12/27/2019  2:00 PM Salvadore Dom, MD [place] None  03/19/2020 11:00 AM Unk Pinto, MD [facility] None     Plan:   During the course of the visit the patient was educated and counseled about appropriate screening and preventive services including:    Pneumococcal vaccine   Prevnar 13  Influenza vaccine  Td vaccine  Screening electrocardiogram  Bone densitometry screening  Colorectal cancer screening  Diabetes screening  Glaucoma screening  Nutrition counseling   Advanced directives: requested   Subjective:  [name] [name] is a 69 y.o. female who presents for Medicare Annual Wellness Visit and 3 month follow up.   She has itching in throat, sore throat, felt better with water. She has slight cough. She is not taking allergy meds at this time. No fever, no chills.   She was seen on 03/01 for congestion, had possible abnormal EKG so was referred to ER  to rule out PE/ACS- had negative Ddimer, negative troponin there and normal eKG.   She has right anterior hip pain with positioning her leg a certain way, worse x oct 2020. She states worse with taking off her sock/shoe, crossing her right leg over her left. When she goes from bending to standing having pain, but no pain with walking/stairs.  No numbness tingling in leg, pain down leg.    Her blood pressure has been controlled at home, today their BP is BP: 110/62 She does workout. She denies chest pain, shortness of breath, dizziness.   BMI is Body mass index is 22.05 kg/m., she is working on diet and exercise. Wt Readings from Last 3 Encounters:  07/13/19 145 lb (65.8 kg)  03/19/19 145 lb 9.6 oz (66 kg)  03/12/19 145 lb 6.4 oz (66 kg)   She is on cholesterol medication and denies myalgias. Her cholesterol is at goal. The cholesterol last visit was:   Lab Results  Component Value Date   CHOL 184 03/12/2019   HDL 66 03/12/2019   LDLCALC 96 03/12/2019   TRIG 123 03/12/2019   CHOLHDL 2.8 03/12/2019    Last A1C in the office was:  Lab Results  Component Value Date   HGBA1C 5.8 (H) 03/12/2019   Last GFR: Lab Results  Component Value Date   GFRNONAA >60 03/19/2019   Patient is on Vitamin D supplement.   Lab Results  Component Value Date   VD25OH 67 03/12/2019      Medication Review:   Current Outpatient Medications (Cardiovascular):  .  amLODipine (NORVASC) 2.5 MG tablet, Take 1 tablet Daily for BP (Patient taking differently: Take 2.5  mg by mouth daily. )   Current Outpatient Medications (Analgesics):  .  acetaminophen (TYLENOL) 325 MG tablet, Take 2 tablets (650 mg total) by mouth every 6 (six) hours as needed for mild pain (or Fever >/= 101). Marland Kitchen  aspirin EC 81 MG tablet, Take 81 mg by mouth every other day.  .  meloxicam (MOBIC) 15 MG tablet, Take 1/2 to 1 tablet Daily with Food for Pain & Inflammation (Patient taking differently: Take 15 mg by mouth daily as needed for  pain (and/or inflammation). )   Current Outpatient Medications (Other):  Marland Kitchen  Ascorbic Acid (VITAMIN C) 1000 MG tablet, Take 1,000 mg by mouth daily. .  calcium carbonate (OS-CAL) 600 MG TABS tablet, Take 600 mg by mouth daily with breakfast. .  Cholecalciferol (VITAMIN D3) 125 MCG (5000 UT) CAPS, Take 5,000 Units by mouth daily. .  fish oil-omega-3 fatty acids 1000 MG capsule, Take 1 g by mouth 3 (three) times daily with meals.  .  Flaxseed, Linseed, (FLAXSEED OIL PO), Take 1 capsule by mouth 3 (three) times daily with meals.  .  Multiple Vitamin (MULTIVITAMIN) tablet, Take 1 tablet by mouth every morning.  .  zinc gluconate 50 MG tablet, Take 50 mg by mouth daily.  Allergies Allergies  Allergen Reactions  . Amoxil [Amoxicillin] Rash  . Ampicillin Rash    Current Problems (verified) Patient Active Problem List   Diagnosis Date Noted  . Abnormal glucose 08/20/2018  . History of thyroiditis 03/26/2018  . Osteopenia of left femoral neck 03/23/2018  . Cystocele with prolapse 03/23/2018  . BMI 22.0-22.9, adult 06/01/2017  . Anemia, iron deficiency 10/16/2015  . Medication management 07/26/2014  . Hyperlipidemia, mixed   . Seasonal allergies   . Vitamin D deficiency   . Hypertension   . History of colonic polyps 03/23/2011    Screening Tests Immunization History  Administered Date(s) Administered  . Influenza Split 01/03/2013  . Influenza, High Dose Seasonal PF 10/23/2015, 11/08/2016, 11/25/2017, 10/04/2018  . PPD Test 07/13/2013  . Pneumococcal Conjugate-13 01/08/2016  . Pneumococcal Polysaccharide-23 12/19/2017  . Pneumococcal-Unspecified 01/18/2002  . Tdap 01/19/2003, 07/05/2012  . Zoster 07/05/2012   Health Maintenance  Topic Date Due  . COVID-19 Vaccine (1) Never done  . INFLUENZA VACCINE  08/19/2019  . MAMMOGRAM  01/29/2020  . COLONOSCOPY  02/15/2021  . TETANUS/TDAP  07/06/2022  . DEXA SCAN  Completed  . Hepatitis C Screening  Completed  . PNA vac Low Risk  Adult  Completed   Preventative care: Last colonoscopy: 01/2016 Last mammogram: 01/2019 Last pap smear/pelvic exam: 2015 DEXA: 01/2016  Prior vaccinations: COVID: declines- discussed and given information Shingrax- has not had yet- discussed  Names of Other Physician/Practitioners you currently use: 1. [facility] here for primary care Patient Care Team: Unk Pinto, MD as PCP - General (Internal Medicine)  SURGICAL HISTORY She  has a past surgical history that includes Cardiac surgery (1958); Colonoscopy; Cervix lesion destruction (N/A, ? years ago); Tubal ligation; Colonoscopy (N/A, 02/16/2016); and Cryotherapy. FAMILY HISTORY Her family history includes Colon cancer (age of onset: 43) in her sister; Diabetes in her brother, father, sister, and sister. SOCIAL HISTORY She  reports that she has never smoked. She has never used smokeless tobacco. She reports current alcohol use. She reports that she does not use drugs.  MEDICARE WELLNESS OBJECTIVES: Physical activity: Current Exercise Habits: Home exercise routine, Type of exercise: walking (watches her grand kids), Time (Minutes): 30, Frequency (Times/Week): 4, Weekly Exercise (  Minutes/Week): 120, Intensity: Mild Cardiac risk factors: Cardiac Risk Factors include: advanced age (>69men, >105 women);dyslipidemia;hypertension;sedentary lifestyle Depression/mood screen:   Depression screen [facility] 2/9 07/13/2019  Decreased Interest 0  Down, Depressed, Hopeless 0  PHQ - 2 Score 0    ADLs:  In your present state of health, do you have any difficulty performing the following activities: 07/13/2019 03/11/2019  Hearing? N N  Vision? N N  Difficulty concentrating or making decisions? N N  Walking or climbing stairs? N N  Dressing or bathing? N N  Doing errands, shopping? N N  Some recent data might be hidden     Cognitive Testing  Alert? Yes  Normal Appearance?Yes  Oriented to person? Yes  Place?  Yes   Time? Yes  Recall of three objects?  Yes  Can perform simple calculations? Yes  Displays appropriate judgment?Yes  Can read the correct time from a watch face?Yes  EOL planning: Does Patient Have a Medical Advance Directive?: Yes Type of Advance Directive: Healthcare Power of Attorney, Living will Copy of [place] in Chart?: No - copy requested  Review of Systems  Constitutional: Negative.   HENT: Negative.   Eyes: Negative.   Respiratory: Negative.   Cardiovascular: Negative.   Gastrointestinal: Negative.   Genitourinary: Negative.   Musculoskeletal: Negative.   Skin: Negative.   Neurological: Negative.   Endo/Heme/Allergies: Negative.   Psychiatric/Behavioral: Negative.      Objective:     Today's Vitals   07/13/19 1114  BP: 110/62  Pulse: 73  Temp: (!) 97.3 F (36.3 C)  SpO2: 95%  Weight: 145 lb (65.8 kg)  Height: 5\' 8"  (1.727 m)  PainSc: 0-No pain   Body mass index is 22.05 kg/m.  General appearance: alert, no distress, WD/WN, female HEENT: normocephalic, sclerae anicteric, TMs pearly, nares patent, no discharge or erythema, pharynx normal Oral cavity: MMM, no lesions Neck: supple, no lymphadenopathy, no thyromegaly, no masses Heart: RRR, normal S1, S2, no murmurs Lungs: CTA bilaterally, no wheezes, rhonchi, or rales Abdomen: +bs, soft, non tender, non distended, no masses, no hepatomegaly, no splenomegaly Musculoskeletal: nontender, no swelling, no obvious deformity. Patient is able to ambulate well.  Gait is not  antalgic.Right hip: positives: pain with movement of hip and pain with rotation and negatives: FROM no pain with heel impact pulses full without SI pain.Good distal sensations and pulses bilaterally. Extremities: no edema, no cyanosis, no clubbing Pulses: 2+ symmetric, upper and lower extremities, normal cap refill Neurological: alert, oriented x 3, CN2-12 intact, strength normal upper extremities and lower extremities,  sensation normal throughout, DTRs 2+ throughout, no cerebellar signs, gait normal Psychiatric: normal affect, behavior normal, pleasant   Medicare Attestation I have personally reviewed: The patient's medical and social history Their use of alcohol, tobacco or illicit drugs Their current medications and supplements The patient's functional ability including ADLs,fall risks, home safety risks, cognitive, and hearing and visual impairment Diet and physical activities Evidence for depression or mood disorders  The patient's weight, height, BMI, and visual acuity have been recorded in the chart.  I have made referrals, counseling, and provided education to the patient based on review of the above and I have provided the patient with a written personalized care plan for preventive services.     Vicie Mutters, PA-C   07/13/2019

## 2019-07-13 ENCOUNTER — Ambulatory Visit (INDEPENDENT_AMBULATORY_CARE_PROVIDER_SITE_OTHER): Payer: Medicare HMO | Admitting: Physician Assistant

## 2019-07-13 ENCOUNTER — Encounter: Payer: Self-pay | Admitting: Physician Assistant

## 2019-07-13 ENCOUNTER — Other Ambulatory Visit: Payer: Self-pay

## 2019-07-13 VITALS — BP 110/62 | HR 73 | Temp 97.3°F | Ht 68.0 in | Wt 145.0 lb

## 2019-07-13 DIAGNOSIS — Z860100 Personal history of colon polyps, unspecified: Secondary | ICD-10-CM

## 2019-07-13 DIAGNOSIS — Z0001 Encounter for general adult medical examination with abnormal findings: Secondary | ICD-10-CM

## 2019-07-13 DIAGNOSIS — E559 Vitamin D deficiency, unspecified: Secondary | ICD-10-CM | POA: Diagnosis not present

## 2019-07-13 DIAGNOSIS — J302 Other seasonal allergic rhinitis: Secondary | ICD-10-CM

## 2019-07-13 DIAGNOSIS — E782 Mixed hyperlipidemia: Secondary | ICD-10-CM

## 2019-07-13 DIAGNOSIS — D509 Iron deficiency anemia, unspecified: Secondary | ICD-10-CM | POA: Diagnosis not present

## 2019-07-13 DIAGNOSIS — M85852 Other specified disorders of bone density and structure, left thigh: Secondary | ICD-10-CM | POA: Diagnosis not present

## 2019-07-13 DIAGNOSIS — I10 Essential (primary) hypertension: Secondary | ICD-10-CM

## 2019-07-13 DIAGNOSIS — Z6822 Body mass index (BMI) 22.0-22.9, adult: Secondary | ICD-10-CM | POA: Diagnosis not present

## 2019-07-13 DIAGNOSIS — N814 Uterovaginal prolapse, unspecified: Secondary | ICD-10-CM | POA: Diagnosis not present

## 2019-07-13 DIAGNOSIS — R6889 Other general symptoms and signs: Secondary | ICD-10-CM | POA: Diagnosis not present

## 2019-07-13 DIAGNOSIS — Z8639 Personal history of other endocrine, nutritional and metabolic disease: Secondary | ICD-10-CM | POA: Diagnosis not present

## 2019-07-13 DIAGNOSIS — Z79899 Other long term (current) drug therapy: Secondary | ICD-10-CM | POA: Diagnosis not present

## 2019-07-13 DIAGNOSIS — R7309 Other abnormal glucose: Secondary | ICD-10-CM

## 2019-07-13 DIAGNOSIS — Z0000 Encounter for general adult medical examination without abnormal findings: Secondary | ICD-10-CM

## 2019-07-13 DIAGNOSIS — M25551 Pain in right hip: Secondary | ICD-10-CM

## 2019-07-13 NOTE — Patient Instructions (Addendum)
INFORMATION ABOUT YOUR XRAY [facility] Can walk into [address] for an Insurance account manager. They will have the order and take you back. You do not any paper work, I should get the result back today or tomorrow. This order is good for a year.  Can call [phone] to schedule an appointment if you wish.    Hip Pain The hip is the joint between the upper legs and the lower pelvis. The bones, cartilage, tendons, and muscles of your hip joint support your body and allow you to move around. Hip pain can range from a minor ache to severe pain in one or both of your hips. The pain may be felt on the inside of the hip joint near the groin, or on the outside near the buttocks and upper thigh. You may also have swelling or stiffness in your hip area. Follow these instructions at home: Managing pain, stiffness, and swelling      If directed, put ice on the painful area. To do this: ? Put ice in a plastic bag. ? Place a towel between your skin and the bag. ? Leave the ice on for 20 minutes, 2-3 times a day.  If directed, apply heat to the affected area as often as told by your health care provider. Use the heat source that your health care provider recommends, such as a moist heat pack or a heating pad. ? Place a towel between your skin and the heat source. ? Leave the heat on for 20-30 minutes. ? Remove the heat if your skin turns bright red. This is especially important if you are unable to feel pain, heat, or cold. You may have a greater risk of getting burned. Activity  Do exercises as told by your health care provider.  Avoid activities that cause pain. General instructions   Take over-the-counter and prescription medicines only as told by your health care provider.  Keep a journal of your symptoms. Write down: ? How often you have hip pain. ? The location of your pain. ? What the pain feels like. ? What makes the pain worse.  Sleep with a pillow between your legs on your  most comfortable side.  Keep all follow-up visits as told by your health care provider. This is important. Contact a health care provider if:  You cannot put weight on your leg.  Your pain or swelling continues or gets worse after one week.  It gets harder to walk.  You have a fever. Get help right away if:  You fall.  You have a sudden increase in pain and swelling in your hip.  Your hip is red or swollen or very tender to touch. Summary  Hip pain can range from a minor ache to severe pain in one or both of your hips.  The pain may be felt on the inside of the hip joint near the groin, or on the outside near the buttocks and upper thigh.  Avoid activities that cause pain.  Write down how often you have hip pain, the location of the pain, what makes it worse, and what it feels like. This information is not intended to replace advice given to you by your health care provider. Make sure you discuss any questions you have with your health care provider. Document Revised: 05/22/2018 Document Reviewed: 05/22/2018 Elsevier Patient Education  [employer].   Here is some information about the vaccines. The data is very good and this information hopefully answers a lot of  your questions and give you a confidence boost.   The Pfizer and Moderna vaccines are messenger RNA vaccines. That technology is not new, it has been studied for 20 years at least, used for cancer and MS treatment. They had started using the vaccine for MERS AND SARS (both a different coronavirus) 10 years ago but never finished so we had a good backbone for this vaccine.   There were no short cuts with the techniques for these clinical  trials, just lots of willing participants quickly and lots of up front money helped speed up the process.   The mRNA is very fragile which is why it needs to be kept so cold and thawed a certain way, think of it as a message in a glass bottle. NO PART of the virus is in this  vaccine, it is a clip of the genetic sequence. This mRNA is injected in your arm, connects with a ribosome, delivers the message and the degrades.  That is part of the cause of the sore arm, the mRNA never leaves your arm. It degrades there. The mRNA does not go into our nucleotide where our DNA is, and we would need a DNA reverse transcriptase to take RNA to DNA, we do not have this, it can not change our DNA. The ribosome that got the message creates a protein and that protein circulates in our body and we have an immune reaction to that creating antibodies. Any time our immune system is triggered, inflammation is triggered too so you can have a temp, muscle aches, etc. Normal reaction.  I have seen so many patients that just had mild COVID in the office weeks later still have issues. We are still learning about post COVID syndrome, the CDC should be coming out for guidelines for practioners soon. There is too much unknown about COVID. We have been using vaccines for over 100 years or more, i Personnel officer. Ask your parents or any older friends about polio and that vaccine, that was a disease shutting down schools,had kids in iron lung, devastating young kids and families. People lined up for that vaccine and technology has only improved.   Please get the vaccine. If you have any further questions please make an appointment in the office to discuss further.  [place]   Ask insurance and pharmacy about shingrix - it is a 2 part shot that we will not be getting in the office.  Can get anytime between now and 2024  Suggest getting AFTER covid vaccines, have to wait at least a month This shot can make you feel bad due to such good immune response it can trigger some inflammation so take tylenol or aleve day of or day after and plan on resting.   Can go to [email or web address] for more information  Shingrix Vaccination  Two vaccines are licensed and  recommended to prevent shingles in the [place] Zoster vaccine live (ZVL, Zostavax) has been in use since 2006. Recombinant zoster vaccine (RZV, Shingrix), has been in use since 2017 and is recommended by ACIP as the preferred shingles vaccine.  What Everyone Should Know about Shingles Vaccine (Shingrix) One of the Recommended Vaccines by Disease Shingles vaccination is the only way to protect against shingles and postherpetic neuralgia (PHN), the most common complication from shingles. CDC recommends that healthy adults 50 years and older get two doses of the shingles vaccine called Shingrix (recombinant zoster vaccine), separated by 2 to 6 months, to prevent shingles and the complications from the  disease. Your doctor or pharmacist can give you Shingrix as a shot in your upper arm. Shingrix provides strong protection against shingles and PHN. Two doses of Shingrix is more than 90% effective at preventing shingles and PHN. Protection stays above 85% for at least the first four years after you get vaccinated. Shingrix is the preferred vaccine, over Zostavax (zoster vaccine live), a shingles vaccine in use since 2006. Zostavax may still be used to prevent shingles in healthy adults 60 years and older. For example, you could use Zostavax if a person is allergic to Shingrix, prefers Zostavax, or requests immediate vaccination and Shingrix is unavailable. Who Should Get Shingrix? Healthy adults 50 years and older should get two doses of Shingrix, separated by 2 to 6 months. You should get Shingrix even if in the past you . had shingles  . received Zostavax  . are not sure if you had chickenpox There is no maximum age for getting Shingrix. If you had shingles in the past, you can get Shingrix to help prevent future occurrences of the disease. There is no specific length of time that you need to wait after having shingles before you can receive Shingrix, but generally you should make sure the shingles rash has  gone away before getting vaccinated. You can get Shingrix whether or not you remember having had chickenpox in the past. Studies show that more than 99% of Americans 40 years and older have had chickenpox, even if they don't remember having the disease. Chickenpox and shingles are related because they are caused by the same virus (varicella zoster virus). After a person recovers from chickenpox, the virus stays dormant (inactive) in the body. It can reactivate years later and cause shingles. If you had Zostavax in the recent past, you should wait at least eight weeks before getting Shingrix. Talk to your healthcare provider to determine the best time to get Shingrix. Shingrix is available in [facility] and pharmacies. To find doctor's offices or pharmacies near you that offer the vaccine, visit HealthMap Vaccine FinderExternal. If you have questions about Shingrix, talk with your healthcare provider. Vaccine for Those 44 Years and Older  Shingrix reduces the risk of shingles and PHN by more than 90% in people 32 and older. CDC recommends the vaccine for healthy adults 42 and older.  Who Should Not Get Shingrix? You should not get Shingrix if you: . have ever had a severe allergic reaction to any component of the vaccine or after a dose of Shingrix  . tested negative for immunity to varicella zoster virus. If you test negative, you should get chickenpox vaccine.  . currently have shingles  . currently are pregnant or breastfeeding. Women who are pregnant or breastfeeding should wait to get Shingrix.  Marland Kitchen receive specific antiviral drugs (acyclovir, famciclovir, or valacyclovir) 24 hours before vaccination (avoid use of these antiviral drugs for 14 days after vaccination)- zoster vaccine live only If you have a minor acute (starts suddenly) illness, such as a cold, you may get Shingrix. But if you have a moderate or severe acute illness, you should usually wait until you recover before getting the  vaccine. This includes anyone with a temperature of 101.50F or higher. The side effects of the Shingrix are temporary, and usually last 2 to 3 days. While you may experience pain for a few days after getting Shingrix, the pain will be less severe than having shingles and the complications from the disease. How Well Does Shingrix Work? Two doses of Shingrix  provides strong protection against shingles and postherpetic neuralgia (PHN), the most common complication of shingles. . In adults 30 to 69 years old who got two doses, Shingrix was 97% effective in preventing shingles; among adults 70 years and older, Shingrix was 91% effective.  . In adults 18 to 69 years old who got two doses, Shingrix was 91% effective in preventing PHN; among adults 70 years and older, Shingrix was 89% effective. Shingrix protection remained high (more than 85%) in people 70 years and older throughout the four years following vaccination. Since your risk of shingles and PHN increases as you get older, it is important to have strong protection against shingles in your older years. Top of Page  What Are the Possible Side Effects of Shingrix? Studies show that Shingrix is safe. The vaccine helps your body create a strong defense against shingles. As a result, you are likely to have temporary side effects from getting the shots. The side effects may affect your ability to do normal daily activities for 2 to 3 days. Most people got a sore arm with mild or moderate pain after getting Shingrix, and some also had redness and swelling where they got the shot. Some people felt tired, had muscle pain, a headache, shivering, fever, stomach pain, or nausea. About 1 out of 6 people who got Shingrix experienced side effects that prevented them from doing regular activities. Symptoms went away on their own in about 2 to 3 days. Side effects were more common in younger people. You might have a reaction to the first or second dose of Shingrix, or  both doses. If you experience side effects, you may choose to take over-the-counter pain medicine such as ibuprofen or acetaminophen. If you experience side effects from Shingrix, you should report them to the Vaccine Adverse Event Reporting System (VAERS). Your doctor might file this report, or you can do it yourself through the VAERS websiteExternal, or by calling [phone]. If you have any questions about side effects from Shingrix, talk with your doctor. The shingles vaccine does not contain thimerosal (a preservative containing mercury). Top of Page  When Should I See a Doctor Because of the Side Effects I Experience From Shingrix? In clinical trials, Shingrix was not associated with serious adverse events. In fact, serious side effects from vaccines are extremely rare. For example, for every 1 million doses of a vaccine given, only one or two people may have a severe allergic reaction. Signs of an allergic reaction happen within minutes or hours after vaccination and include hives, swelling of the face and throat, difficulty breathing, a fast heartbeat, dizziness, or weakness. If you experience these or any other life-threatening symptoms, see a doctor right away. Shingrix causes a strong response in your immune system, so it may produce short-term side effects more intense than you are used to from other vaccines. These side effects can be uncomfortable, but they are expected and usually go away on their own in 2 or 3 days. Top of Page  How Can I Pay For Shingrix? There are several ways shingles vaccine may be paid for: Medicare . Medicare Part D plans cover the shingles vaccine, but there may be a cost to you depending on your plan. There may be a copay for the vaccine, or you may need to pay in full then get reimbursed for a certain amount.  . Medicare Part B does not cover the shingles vaccine. Medicaid . Medicaid may or may not cover the vaccine. Contact your insurer  to find  out. Private health insurance . Many private health insurance plans will cover the vaccine, but there may be a cost to you depending on your plan. Contact your insurer to find out. Vaccine assistance programs . Some pharmaceutical companies provide vaccines to eligible adults who cannot afford them. You may want to check with the vaccine manufacturer, [employer], about Shingrix. If you do not currently have health insurance, learn more about affordable health coverage optionsExternal. To find doctor's offices or pharmacies near you that offer the vaccine, visit HealthMap Vaccine FinderExternal.

## 2019-07-14 LAB — CBC WITH DIFFERENTIAL/PLATELET
Absolute Monocytes: 207 cells/uL (ref 200–950)
Basophils Absolute: 10 cells/uL (ref 0–200)
Basophils Relative: 0.5 %
Eosinophils Absolute: 10 cells/uL — ABNORMAL LOW (ref 15–500)
Eosinophils Relative: 0.5 %
HCT: 36.5 % (ref 35.0–45.0)
Hemoglobin: 12.3 g/dL (ref 11.7–15.5)
Lymphs Abs: 659 cells/uL — ABNORMAL LOW (ref 850–3900)
MCH: 30.4 pg (ref 27.0–33.0)
MCHC: 33.7 g/dL (ref 32.0–36.0)
MCV: 90.1 fL (ref 80.0–100.0)
MPV: 11.4 fL (ref 7.5–12.5)
Monocytes Relative: 10.9 %
Neutro Abs: 1015 cells/uL — ABNORMAL LOW (ref 1500–7800)
Neutrophils Relative %: 53.4 %
Platelets: 181 10*3/uL (ref 140–400)
RBC: 4.05 10*6/uL (ref 3.80–5.10)
RDW: 12 % (ref 11.0–15.0)
Total Lymphocyte: 34.7 %
WBC: 1.9 10*3/uL — ABNORMAL LOW (ref 3.8–10.8)

## 2019-07-14 LAB — COMPLETE METABOLIC PANEL WITH GFR
AG Ratio: 2 (calc) (ref 1.0–2.5)
ALT: 18 U/L (ref 6–29)
AST: 24 U/L (ref 10–35)
Albumin: 4.4 g/dL (ref 3.6–5.1)
Alkaline phosphatase (APISO): 50 U/L (ref 37–153)
BUN: 12 mg/dL (ref 7–25)
CO2: 30 mmol/L (ref 20–32)
Calcium: 9.1 mg/dL (ref 8.6–10.4)
Chloride: 101 mmol/L (ref 98–110)
Creat: 0.57 mg/dL (ref 0.50–0.99)
GFR, Est African American: 110 mL/min/{1.73_m2} (ref >=60)
GFR, Est Non African American: 95 mL/min/{1.73_m2} (ref >=60)
Globulin: 2.2 g/dL (calc) (ref 1.9–3.7)
Glucose, Bld: 96 mg/dL (ref 65–99)
Potassium: 4.1 mmol/L (ref 3.5–5.3)
Sodium: 138 mmol/L (ref 135–146)
Total Bilirubin: 0.3 mg/dL (ref 0.2–1.2)
Total Protein: 6.6 g/dL (ref 6.1–8.1)

## 2019-07-14 LAB — HEMOGLOBIN A1C
Hgb A1c MFr Bld: 5.7 % of total Hgb — ABNORMAL HIGH (ref <5.7)
Mean Plasma Glucose: 117 (calc)
eAG (mmol/L): 6.5 (calc)

## 2019-07-14 LAB — TSH: TSH: 2.39 mIU/L (ref 0.40–4.50)

## 2019-07-15 NOTE — Addendum Note (Signed)
Addended by: Vicie Mutters R on: 07/15/2019 07:09 PM   Modules accepted: Orders

## 2019-07-17 DIAGNOSIS — U071 COVID-19: Secondary | ICD-10-CM | POA: Diagnosis not present

## 2019-07-17 DIAGNOSIS — Z20828 Contact with and (suspected) exposure to other viral communicable diseases: Secondary | ICD-10-CM | POA: Diagnosis not present

## 2019-07-17 MED ORDER — BENZONATATE 200 MG PO CAPS: 200.0000 mg | ORAL_CAPSULE | Freq: Three times a day (TID) | ORAL | 0 refills | Status: DC | PRN

## 2019-08-02 ENCOUNTER — Other Ambulatory Visit: Payer: Self-pay | Admitting: Internal Medicine

## 2019-08-02 DIAGNOSIS — R059 Cough, unspecified: Secondary | ICD-10-CM

## 2019-08-02 MED ORDER — DEXAMETHASONE 4 MG PO TABS: ORAL_TABLET | ORAL | 0 refills | Status: DC

## 2019-08-02 MED ORDER — PROMETHAZINE-DM 6.25-15 MG/5ML PO SYRP: ORAL_SOLUTION | ORAL | 1 refills | Status: DC

## 2019-08-28 ENCOUNTER — Ambulatory Visit
Admission: RE | Admit: 2019-08-28 | Discharge: 2019-08-28 | Disposition: A | Discharge status: Home / Self Care | Payer: Medicare HMO | Source: Ambulatory Visit | Attending: Physician Assistant | Admitting: Physician Assistant

## 2019-08-28 DIAGNOSIS — M1611 Unilateral primary osteoarthritis, right hip: Secondary | ICD-10-CM | POA: Diagnosis not present

## 2019-08-28 DIAGNOSIS — M25551 Pain in right hip: Secondary | ICD-10-CM

## 2019-08-28 DIAGNOSIS — M25751 Osteophyte, right hip: Secondary | ICD-10-CM | POA: Diagnosis not present

## 2019-09-04 DIAGNOSIS — R69 Illness, unspecified: Secondary | ICD-10-CM | POA: Diagnosis not present

## 2019-09-24 ENCOUNTER — Encounter: Payer: Self-pay | Admitting: Internal Medicine

## 2019-09-24 NOTE — Progress Notes (Signed)
History of Present Illness:       This very nice 69 y.o.  MWF  presents for 6  month follow up with HTN, HLD, Pre-Diabetes and Vitamin D Deficiency.  Patient was dx'd with Thyroiditis in May-July 2019 tx'd w/Prednisone -tapered to d/c in Sept 2019.  Patient had Covid 19 in June 2021 with minimal sx's. She was encouraged to get the recommended vaccine booster 8 month later.           Patient is c/o intermittent Rt hip pain associated with prolonged standing /walking. She requests Ortho referral.        Patient is treated for HTN (2001) & BP has been controlled at home. Today's BP is at goal - 122/68. Patient has had no complaints of any cardiac type chest pain, palpitations, dyspnea / orthopnea / PND, dizziness, claudication, or dependent edema.      Hyperlipidemia is controlled with diet & supplements. Last Lipids were at goal:  Lab Results  Component Value Date   CHOL 184 03/12/2019   HDL 66 03/12/2019   LDLCALC 96 03/12/2019   TRIG 123 03/12/2019   CHOLHDL 2.8 03/12/2019    Also, the patient has history of PreDiabetes (A1c 6.0% /2014 & 5.9% /2017)   and has had no symptoms of reactive hypoglycemia, diabetic polys, paresthesias or visual blurring.  Last A1c was near goal:  Lab Results  Component Value Date   HGBA1C 5.7 (H) 07/13/2019       Further, the patient also has history of Vitamin D Deficiency   (A1c 6.0% /2014 & 5.9% /2017)  and supplements vitamin D without any suspected side-effects. Last vitamin D was at goal:  Lab Results  Component Value Date   VD25OH 67 03/12/2019    Current Outpatient Medications on File Prior to Visit  Medication Sig  . acetaminophen (TYLENOL) 325 MG tablet Take 2 tablets (650 mg total) by mouth every 6 (six) hours as needed for mild pain (or Fever >/= 101).  Marland Kitchen amLODipine (NORVASC) 2.5 MG tablet Take 1 tablet Daily for BP (Patient taking differently: Take 2.5 mg by mouth daily. )  . Ascorbic Acid (VITAMIN C) 1000 MG tablet Take  1,000 mg by mouth daily.  Marland Kitchen aspirin EC 81 MG tablet Take 81 mg by mouth every other day.   . benzonatate (TESSALON) 200 MG capsule Take 1 capsule (200 mg total) by mouth 3 (three) times daily as needed for cough (Max: 600mg  per day).  . calcium carbonate (OS-CAL) 600 MG TABS tablet Take 600 mg by mouth daily with breakfast.  . Cholecalciferol (VITAMIN D3) 125 MCG (5000 UT) CAPS Take 5,000 Units by mouth daily.  Marland Kitchen dexamethasone (DECADRON) 4 MG tablet Take 1 tab 2 x day - 3 days, then 2 x day - 2 days, then 1 tab daily  . fish oil-omega-3 fatty acids 1000 MG capsule Take 1 g by mouth 3 (three) times daily with meals.   . Flaxseed, Linseed, (FLAXSEED OIL PO) Take 1 capsule by mouth 3 (three) times daily with meals.   . meloxicam (MOBIC) 15 MG tablet Take 1/2 to 1 tablet Daily with Food for Pain & Inflammation (Patient taking differently: Take 15 mg by mouth daily as needed for pain (and/or inflammation). )  . Multiple Vitamin (MULTIVITAMIN) tablet Take 1 tablet by mouth every morning.   Marland Kitchen OVER THE COUNTER MEDICATION Takes Allergy meds as needed  . promethazine-dextromethorphan (PROMETHAZINE-DM) 6.25-15 MG/5ML syrup Take 1 to 2 tsp  enery 4 hours if needed for cough  . zinc gluconate 50 MG tablet Take 50 mg by mouth daily.   No current facility-administered medications on file prior to visit.    Allergies  Allergen Reactions  . Amoxil [Amoxicillin] Rash    PMHx:   Past Medical History:  Diagnosis Date  . Abnormal Pap smear of cervix   . Acute lower GI bleeding 02/16/2016  . Heart murmur   . Hyperlipidemia   . Hypertension   . Prediabetes   . Seasonal allergies   . Vitamin D deficiency     Immunization History  Administered Date(s) Administered  . Influenza Split 01/03/2013  . Influenza, High Dose Seasonal PF 10/23/2015, 11/08/2016, 11/25/2017, 10/04/2018  . PPD Test 07/13/2013  . Pneumococcal Conjugate-13 01/08/2016  . Pneumococcal Polysaccharide-23 12/19/2017  .  Pneumococcal-Unspecified 01/18/2002  . Tdap 01/19/2003, 07/05/2012  . Zoster 07/05/2012    Past Surgical History:  Procedure Laterality Date  . 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 CERVIX LESION DESTRUCTION N/A ? years ago   with abnormal pap  . COLONOSCOPY     polyps  . COLONOSCOPY N/A 02/16/2016   Procedure: COLONOSCOPY;  Surgeon: Ladene Artist, MD;  Location: [facility];  Service: Endoscopy;  Laterality: N/A;  . CRYOTHERAPY     for abnormal pap smear  . TUBAL LIGATION      FHx:    Reviewed / unchanged  SHx:    Reviewed / unchanged   Systems Review:  Constitutional: Denies fever, chills, wt changes, headaches, insomnia, fatigue, night sweats, change in appetite. Eyes: Denies redness, blurred vision, diplopia, discharge, itchy, watery eyes.  ENT: Denies discharge, congestion, post nasal drip, epistaxis, sore throat, earache, hearing loss, dental pain, tinnitus, vertigo, sinus pain, snoring.  CV: Denies chest pain, palpitations, irregular heartbeat, syncope, dyspnea, diaphoresis, orthopnea, PND, claudication or edema. Respiratory: denies cough, dyspnea, DOE, pleurisy, hoarseness, laryngitis, wheezing.  Gastrointestinal: Denies dysphagia, odynophagia, heartburn, reflux, water brash, abdominal pain or cramps, nausea, vomiting, bloating, diarrhea, constipation, hematemesis, melena, hematochezia  or hemorrhoids. Genitourinary: Denies dysuria, frequency, urgency, nocturia, hesitancy, discharge, hematuria or flank pain. Musculoskeletal: Denies arthralgias, myalgias, stiffness, jt. swelling, pain, limping or strain/sprain.  Skin: Denies pruritus, rash, hives, warts, acne, eczema or change in skin lesion(s). Neuro: No weakness, tremor, incoordination, spasms, paresthesia or pain. Psychiatric: Denies confusion, memory loss or sensory loss. Endo: Denies change in weight, skin or hair change.  Heme/Lymph: No excessive bleeding, bruising or enlarged lymph  nodes.  Physical Exam  BP 122/68   Pulse 83   Temp (!) 97.5 F (36.4 C)   Ht 5\' 8"  (1.727 m)   Wt 143 lb (64.9 kg)   LMP 11/17/2002   SpO2 96%   BMI 21.74 kg/m   Appears  well nourished, well groomed  and in no distress.  Eyes: PERRLA, EOMs, conjunctiva no swelling or erythema. Sinuses: No frontal/maxillary tenderness ENT/Mouth: EAC's clear, TM's nl w/o erythema, bulging. Nares clear w/o erythema, swelling, exudates. Oropharynx clear without erythema or exudates. Oral hygiene is good. Tongue normal, non obstructing. Hearing intact.  Neck: Supple. Thyroid not palpable. Car 2+/2+ without bruits, nodes or JVD. Chest: Respirations nl with BS clear & equal w/o rales, rhonchi, wheezing or stridor.  Cor: Heart sounds normal w/ regular rate and rhythm without sig. murmurs, gallops, clicks or rubs. Peripheral pulses normal and equal  without edema.  Abdomen: Soft & bowel sounds normal. Non-tender w/o guarding, rebound, hernias, masses or organomegaly.  Lymphatics: Unremarkable.  Musculoskeletal: Full ROM all peripheral extremities, joint stability, 5/5 strength and normal gait.  Skin: Warm, dry without exposed rashes, lesions or ecchymosis apparent.  Neuro: Cranial nerves intact, reflexes equal bilaterally. Sensory-motor testing grossly intact. Tendon reflexes grossly intact.  Pysch: Alert & oriented x 3.  Insight and judgement nl & appropriate. No ideations.  Assessment and Plan:  1. Essential hypertension  - Continue medication, monitor blood pressure at home.  - Continue DASH diet.  Reminder to go to the ER if any CP,  SOB, nausea, dizziness, severe HA, changes vision/speech.  - CBC with Differential/Platelet - COMPLETE METABOLIC PANEL WITH GFR - Magnesium - TSH  2. Hyperlipidemia, mixed  - Continue diet/meds, exercise,& lifestyle modifications.  - Continue monitor periodic cholesterol/liver & renal functions   - Lipid panel - TSH  3. Abnormal glucose  - Continue diet,  exercise  - Lifestyle modifications.  - Monitor appropriate labs.  - Hemoglobin A1c - Insulin, random  4. Vitamin D deficiency  - Continue supplementation.  - VITAMIN D 25 Hydroxyl  5. Medication management  - CBC with Differential/Platelet - COMPLETE METABOLIC PANEL WITH GFR - Magnesium - Lipid panel - TSH - Hemoglobin A1c - Insulin, random - VITAMIN D 25 Hydroxyl        Discussed  regular exercise, BP monitoring, weight control to achieve/maintain BMI less than 25 and discussed med and SE's. Recommended labs to assess and monitor clinical status with further disposition pending results of labs.  I discussed the assessment and treatment plan with the patient. The patient was provided an opportunity to ask questions and all were answered. The patient agreed with the plan and demonstrated an understanding of the instructions.  I provided over 30 minutes of exam, counseling, chart review and  complex critical decision making.   Kirtland Bouchard, MD

## 2019-09-24 NOTE — Patient Instructions (Signed)

## 2019-09-25 ENCOUNTER — Encounter: Payer: Self-pay | Admitting: Internal Medicine

## 2019-09-26 ENCOUNTER — Encounter: Payer: Self-pay | Admitting: Internal Medicine

## 2019-09-26 ENCOUNTER — Other Ambulatory Visit: Payer: Self-pay

## 2019-09-26 ENCOUNTER — Ambulatory Visit (INDEPENDENT_AMBULATORY_CARE_PROVIDER_SITE_OTHER): Payer: Medicare HMO | Admitting: Internal Medicine

## 2019-09-26 VITALS — BP 122/68 | HR 83 | Temp 97.5°F | Ht 68.0 in | Wt 143.0 lb

## 2019-09-26 DIAGNOSIS — M25551 Pain in right hip: Secondary | ICD-10-CM

## 2019-09-26 DIAGNOSIS — R7309 Other abnormal glucose: Secondary | ICD-10-CM

## 2019-09-26 DIAGNOSIS — Z79899 Other long term (current) drug therapy: Secondary | ICD-10-CM | POA: Diagnosis not present

## 2019-09-26 DIAGNOSIS — E559 Vitamin D deficiency, unspecified: Secondary | ICD-10-CM | POA: Diagnosis not present

## 2019-09-26 DIAGNOSIS — I10 Essential (primary) hypertension: Secondary | ICD-10-CM

## 2019-09-26 DIAGNOSIS — E782 Mixed hyperlipidemia: Secondary | ICD-10-CM | POA: Diagnosis not present

## 2019-09-27 LAB — INSULIN, RANDOM: Insulin: 10.6 u[IU]/mL

## 2019-09-27 LAB — COMPLETE METABOLIC PANEL WITH GFR
AG Ratio: 1.6 (calc) (ref 1.0–2.5)
ALT: 15 U/L (ref 6–29)
AST: 17 U/L (ref 10–35)
Albumin: 4.4 g/dL (ref 3.6–5.1)
Alkaline phosphatase (APISO): 60 U/L (ref 37–153)
BUN: 15 mg/dL (ref 7–25)
CO2: 29 mmol/L (ref 20–32)
Calcium: 9.6 mg/dL (ref 8.6–10.4)
Chloride: 103 mmol/L (ref 98–110)
Creat: 0.53 mg/dL (ref 0.50–0.99)
GFR, Est African American: 113 mL/min/{1.73_m2} (ref >=60)
GFR, Est Non African American: 98 mL/min/{1.73_m2} (ref >=60)
Globulin: 2.7 g/dL (calc) (ref 1.9–3.7)
Glucose, Bld: 98 mg/dL (ref 65–99)
Potassium: 4.6 mmol/L (ref 3.5–5.3)
Sodium: 140 mmol/L (ref 135–146)
Total Bilirubin: 0.5 mg/dL (ref 0.2–1.2)
Total Protein: 7.1 g/dL (ref 6.1–8.1)

## 2019-09-27 LAB — CBC WITH DIFFERENTIAL/PLATELET
Absolute Monocytes: 422 cells/uL (ref 200–950)
Basophils Absolute: 50 cells/uL (ref 0–200)
Basophils Relative: 0.8 %
Eosinophils Absolute: 88 cells/uL (ref 15–500)
Eosinophils Relative: 1.4 %
HCT: 37.4 % (ref 35.0–45.0)
Hemoglobin: 12.5 g/dL (ref 11.7–15.5)
Lymphs Abs: 1418 cells/uL (ref 850–3900)
MCH: 30.6 pg (ref 27.0–33.0)
MCHC: 33.4 g/dL (ref 32.0–36.0)
MCV: 91.4 fL (ref 80.0–100.0)
MPV: 10.8 fL (ref 7.5–12.5)
Monocytes Relative: 6.7 %
Neutro Abs: 4322 cells/uL (ref 1500–7800)
Neutrophils Relative %: 68.6 %
Platelets: 280 10*3/uL (ref 140–400)
RBC: 4.09 10*6/uL (ref 3.80–5.10)
RDW: 12.5 % (ref 11.0–15.0)
Total Lymphocyte: 22.5 %
WBC: 6.3 10*3/uL (ref 3.8–10.8)

## 2019-09-27 LAB — LIPID PANEL
Cholesterol: 157 mg/dL (ref <200)
HDL: 54 mg/dL (ref >=50)
LDL Cholesterol (Calc): 79 mg/dL (calc)
Non-HDL Cholesterol (Calc): 103 mg/dL (calc) (ref <130)
Total CHOL/HDL Ratio: 2.9 (calc) (ref <5.0)
Triglycerides: 147 mg/dL (ref <150)

## 2019-09-27 LAB — HEMOGLOBIN A1C
Hgb A1c MFr Bld: 5.5 % of total Hgb (ref <5.7)
Mean Plasma Glucose: 111 (calc)
eAG (mmol/L): 6.2 (calc)

## 2019-09-27 LAB — MAGNESIUM: Magnesium: 1.8 mg/dL (ref 1.5–2.5)

## 2019-09-27 LAB — TSH: TSH: 2.79 mIU/L (ref 0.40–4.50)

## 2019-09-27 LAB — VITAMIN D 25 HYDROXY (VIT D DEFICIENCY, FRACTURES): Vit D, 25-Hydroxy: 75 ng/mL (ref 30–100)

## 2019-09-27 NOTE — Progress Notes (Signed)
========================================================== -   Test results slightly outside the reference range are not unusual. If there is anything important, I will review this with you,  otherwise it is considered normal test values.  If you have further questions,  please do not hesitate to contact me at the office or via My Chart.  ==========================================================  -   Magnesium  -   1.8   -  very  low- goal is betw 2.0 - 2.5,   - So..............Marland Kitchen  Recommend that you take  Magnesium 500 mg tablet daily   - also important to eat lots of  leafy green vegetables   - spinach - Kale - collards - greens - okra - asparagus  - broccoli - quinoa - squash - almonds   - black, red, white beans  -  peas - green beans ==========================================================  -  Total Chol = 157 and LDL Chol = 79 - Both  Excellent   - Very low risk for Heart Attack  / Stroke =============================================================  - A1c = 5.5% - Back in Normal, NonDiabetic range - Great  ! ==========================================================  -  Vitamin D = 75 - Excellent ! ==========================================================  -   All Else - CBC - Kidneys - Electrolytes - Liver & Thyroid    - all  Normal / OK ====================================================   - Keep up the [place] Work   !  ==========================================================

## 2019-10-09 ENCOUNTER — Other Ambulatory Visit: Payer: Self-pay | Admitting: Internal Medicine

## 2019-10-23 DIAGNOSIS — M25572 Pain in left ankle and joints of left foot: Secondary | ICD-10-CM | POA: Diagnosis not present

## 2019-10-23 DIAGNOSIS — M1611 Unilateral primary osteoarthritis, right hip: Secondary | ICD-10-CM | POA: Diagnosis not present

## 2019-11-26 ENCOUNTER — Telehealth: Payer: Self-pay | Admitting: *Deleted

## 2019-11-26 NOTE — Telephone Encounter (Signed)
Returned call to patient regarding elevated blood pressure. Patient states her BP is running high at around 160 to 170. Per Dr Melford Aase, patient was advised to increase her Amlodipine 2.5 mg to 2 tablets, check her BP morning and evening for 1 week and call back with results.

## 2019-12-03 ENCOUNTER — Other Ambulatory Visit: Payer: Self-pay | Admitting: Internal Medicine

## 2019-12-03 DIAGNOSIS — Z1231 Encounter for screening mammogram for malignant neoplasm of breast: Secondary | ICD-10-CM

## 2019-12-25 DIAGNOSIS — M1611 Unilateral primary osteoarthritis, right hip: Secondary | ICD-10-CM | POA: Diagnosis not present

## 2019-12-27 ENCOUNTER — Other Ambulatory Visit: Payer: Self-pay

## 2019-12-27 ENCOUNTER — Ambulatory Visit (INDEPENDENT_AMBULATORY_CARE_PROVIDER_SITE_OTHER): Payer: Medicare HMO | Admitting: Obstetrics and Gynecology

## 2019-12-27 ENCOUNTER — Encounter: Payer: Self-pay | Admitting: Obstetrics and Gynecology

## 2019-12-27 VITALS — BP 122/70 | HR 72 | Ht 68.0 in | Wt 144.6 lb

## 2019-12-27 DIAGNOSIS — Z8739 Personal history of other diseases of the musculoskeletal system and connective tissue: Secondary | ICD-10-CM | POA: Diagnosis not present

## 2019-12-27 DIAGNOSIS — Z01419 Encounter for gynecological examination (general) (routine) without abnormal findings: Secondary | ICD-10-CM

## 2019-12-27 NOTE — Patient Instructions (Signed)

## 2019-12-27 NOTE — Progress Notes (Signed)
69 y.o. [id number] Married White or Caucasian Not Hispanic or Latino female here for annual exam.  She is doing well. No vaginal bleeding. Sexually active, no pain.    H/O occasional urinary leakage with exercise, stable. No bowel c/o.     Patient's last menstrual period was 11/17/2002.          Sexually active: Yes.    The current method of family planning is post menopausal status.    Exercising: Yes.    Walking  Smoker:  no  Health Maintenance: Pap:   11-26-14 neg HPV HR neg, 12-21-17 neg HPV HR neg History of abnormal Pap:  Yes hyster of cryo years ago  MMG:  01/29/19 Density D Bi-rads 1 neg  BMD:   03/21/19 osteopenic, T score -1.2, FRAX 7.2/0.5  Colonoscopy: 2018 polyps f/u 68yrs with Dr Carlean Purl, Had GI bleed & was seen in ER  TDaP:  2014 Gardasil: NA   reports that she has never smoked. She has never used smokeless tobacco. She reports current alcohol use. She reports that she does not use drugs. She drinks occasional ETOH. 2 grown children, 2 grandchildren (20 and 5). Everyone lives in [place]  Past Medical History:  Diagnosis Date  . Abnormal Pap smear of cervix   . Acute lower GI bleeding 02/16/2016  . Heart murmur   . Hyperlipidemia   . Hypertension   . Prediabetes   . Seasonal allergies   . Vitamin D deficiency     Past Surgical History:  Procedure Laterality Date  . CARDIAC SURGERY  1958   at age 41 for hole in heart  . CERVIX LESION DESTRUCTION N/A ? years ago   with abnormal pap  . COLONOSCOPY     polyps  . COLONOSCOPY N/A 02/16/2016   Procedure: COLONOSCOPY;  Surgeon: Ladene Artist, MD;  Location: [facility];  Service: Endoscopy;  Laterality: N/A;  . CRYOTHERAPY     for abnormal pap smear  . TUBAL LIGATION      Current Outpatient Medications  Medication Sig Dispense Refill  . acetaminophen (TYLENOL) 325 MG tablet Take 2 tablets (650 mg total) by mouth every 6 (six) hours as needed for mild pain (or Fever >/= 101). 30 tablet 0  . amLODipine (NORVASC) 2.5 MG  tablet Take 1 tablet Daily for BP (Patient taking differently: Take 2.5 mg by mouth daily.) 90 tablet 3  . Ascorbic Acid (VITAMIN C) 1000 MG tablet Take 1,000 mg by mouth daily.    Marland Kitchen aspirin EC 81 MG tablet Take 81 mg by mouth every other day.     . calcium carbonate (OS-CAL) 600 MG TABS tablet Take 600 mg by mouth daily with breakfast.    . Cholecalciferol (VITAMIN D3) 125 MCG (5000 UT) CAPS Take 5,000 Units by mouth daily.    . fish oil-omega-3 fatty acids 1000 MG capsule Take 1 g by mouth 3 (three) times daily with meals.     . Flaxseed, Linseed, (FLAXSEED OIL PO) Take 1 capsule by mouth 3 (three) times daily with meals.     . Multiple Vitamin (MULTIVITAMIN) tablet Take 1 tablet by mouth every morning.     Marland Kitchen OVER THE COUNTER MEDICATION Takes Allergy meds as needed    . zinc gluconate 50 MG tablet Take 50 mg by mouth daily.     No current facility-administered medications for this visit.    Family History  Problem Relation Age of Onset  . Colon cancer Sister 69  . Diabetes Sister   .  Diabetes Father   . Diabetes Brother   . Diabetes Sister   . Stomach cancer Neg Hx   . Breast cancer Neg Hx     Review of Systems  All other systems reviewed and are negative.   Exam:   BP 122/70   Pulse 72   Ht 5\' 8"  (1.727 m)   Wt 144 lb 9.6 oz (65.6 kg)   LMP 11/17/2002   SpO2 99%   BMI 21.99 kg/m   Weight change: @WEIGHTCHANGE @ Height:   Height: 5\' 8"  (172.7 cm)  Ht Readings from Last 3 Encounters:  12/27/19 5\' 8"  (1.727 m)  09/26/19 5\' 8"  (1.727 m)  07/13/19 5\' 8"  (1.727 m)    General appearance: alert, cooperative and appears stated age Head: Normocephalic, without obvious abnormality, atraumatic Neck: no adenopathy, supple, symmetrical, trachea midline and thyroid normal to inspection and palpation Breasts: normal appearance, no masses or tenderness Abdomen: soft, non-tender; non distended,  no masses,  no organomegaly Extremities: extremities normal, atraumatic, no cyanosis or  edema Skin: Skin color, texture, turgor normal. No rashes or lesions Lymph nodes: Cervical, supraclavicular, and axillary nodes normal. No abnormal inguinal nodes palpated Neurologic: Grossly normal   Pelvic: External genitalia:  no lesions              Urethra:  normal appearing urethra with no masses, tenderness or lesions              Bartholins and Skenes: normal                 Vagina: normal appearing vagina with normal color and discharge, no lesions              Cervix: no lesions               Bimanual Exam:  Uterus:  normal size, contour, position, consistency, mobility, non-tender              Adnexa: no mass, fullness, tenderness               Rectovaginal: Confirms               Anus:  normal sphincter tone, no lesions  Gae Dry chaperoned for the exam.  A:  Well Woman with normal exam  H/O mild osteopenia, reviewed DEXA from earlier this year.   P:   No pap this year, I would recommend she continue to have paps every 5 years  Discussed breast self exam  Discussed calcium and vit D intake, continue to exercise  DEXA UTD, would recheck in 3-5 years  Colonoscopy UTD  Mammogram is scheduled next month.

## 2019-12-28 ENCOUNTER — Ambulatory Visit: Payer: Medicare HMO | Admitting: Certified Nurse Midwife

## 2020-01-03 ENCOUNTER — Encounter: Payer: Self-pay | Admitting: Adult Health Nurse Practitioner

## 2020-01-03 ENCOUNTER — Ambulatory Visit (INDEPENDENT_AMBULATORY_CARE_PROVIDER_SITE_OTHER): Payer: Medicare HMO | Admitting: Adult Health Nurse Practitioner

## 2020-01-03 ENCOUNTER — Other Ambulatory Visit: Payer: Self-pay

## 2020-01-03 VITALS — BP 124/78 | HR 63 | Temp 97.3°F | Wt 144.0 lb

## 2020-01-03 DIAGNOSIS — M25551 Pain in right hip: Secondary | ICD-10-CM

## 2020-01-03 DIAGNOSIS — J302 Other seasonal allergic rhinitis: Secondary | ICD-10-CM | POA: Diagnosis not present

## 2020-01-03 DIAGNOSIS — E559 Vitamin D deficiency, unspecified: Secondary | ICD-10-CM

## 2020-01-03 DIAGNOSIS — E782 Mixed hyperlipidemia: Secondary | ICD-10-CM

## 2020-01-03 DIAGNOSIS — Z6822 Body mass index (BMI) 22.0-22.9, adult: Secondary | ICD-10-CM | POA: Diagnosis not present

## 2020-01-03 DIAGNOSIS — D509 Iron deficiency anemia, unspecified: Secondary | ICD-10-CM | POA: Diagnosis not present

## 2020-01-03 DIAGNOSIS — I10 Essential (primary) hypertension: Secondary | ICD-10-CM | POA: Diagnosis not present

## 2020-01-03 DIAGNOSIS — Z23 Encounter for immunization: Secondary | ICD-10-CM | POA: Diagnosis not present

## 2020-01-03 DIAGNOSIS — Z79899 Other long term (current) drug therapy: Secondary | ICD-10-CM | POA: Diagnosis not present

## 2020-01-03 NOTE — Progress Notes (Signed)
FOLLOW UP 3 MONTH   Assessment:   BMI 22.0-22.9, adult Monitor  Essential hypertension - continue medications, DASH diet, exercise and monitor at home. Call if greater than 130/80.   Hyperlipidemia, unspecified hyperlipidemia type -continue medications, check lipids, decrease fatty foods, increase activity.   Iron deficiency anemia, unspecified iron deficiency anemia type Monitor CBC  Medication management  Vitamin D deficiency Continue supplement  Seasonal allergies Continue allergy pill  Right Hip pain  Had injection ICE PRN May use ibuprofen dicussed minimal use, take with food May take tylenol as needed also  . Further disposition pending results if labs check today. Discussed med's effects and SE's.   Over 30 minutes of face to face interview, exam, counseling, chart review, and critical decision making was performed.   Future Appointments  Date Time Provider [place]  01/31/2020  9:00 AM [facility] MM 2 [facility]  04/09/2020  2:00 PM Garnet Sierras, NP [facility] None  07/16/2020 11:00 AM Bronte Kropf, Danton Sewer, NP [facility] None    HPI  [name] [name] is a 69 y.o. female who presents for  3 month follow up on HTN, HLD, seasonal allergies, vitamin D deficency abnormal glucose and weight.  She reports she had a hip injection at [facility] one week ago.  She reports it has imrroved.They just got a new puppy, miniature dauchson.  She reports that has flared her hi[ up a little bit.  She was seen on 03/01 for congestion, had possible abnormal EKG so was referred to ER to rule out PE/ACS- had negative Ddimer, negative troponin there and normal eKG.   She has right anterior hip pain with positioning her leg a certain way, worse x oct 2020. She states worse with taking off her sock/shoe, crossing her right leg over her left. When she goes from bending to standing having pain, but no pain with walking/stairs.  No numbness tingling in leg, pain  down leg.    Her blood pressure has been controlled at home, today their BP is BP: 124/78 She does workout. She denies chest pain, shortness of breath, dizziness.   BMI is Body mass index is 21.9 kg/m., she is working on diet and exercise. Wt Readings from Last 3 Encounters:  01/03/20 144 lb (65.3 kg)  12/27/19 144 lb 9.6 oz (65.6 kg)  09/26/19 143 lb (64.9 kg)   She is on cholesterol medication and denies myalgias. Her cholesterol is at goal. The cholesterol last visit was:   Lab Results  Component Value Date   CHOL 157 09/26/2019   HDL 54 09/26/2019   LDLCALC 79 09/26/2019   TRIG 147 09/26/2019   CHOLHDL 2.9 09/26/2019    Last A1C in the office was:  Lab Results  Component Value Date   HGBA1C 5.5 09/26/2019   Last GFR: Lab Results  Component Value Date   GFRNONAA 98 09/26/2019   Patient is on Vitamin D supplement.   Lab Results  Component Value Date   VD25OH 75 09/26/2019      Medication Review:   Current Outpatient Medications (Cardiovascular):    amLODipine (NORVASC) 2.5 MG tablet, Take 1 tablet Daily for BP (Patient taking differently: Take 2.5 mg by mouth daily.)   Current Outpatient Medications (Analgesics):    acetaminophen (TYLENOL) 325 MG tablet, Take 2 tablets (650 mg total) by mouth every 6 (six) hours as needed for mild pain (or Fever >/= 101).   aspirin EC 81 MG tablet, Take 81 mg by mouth every other day.  Current Outpatient Medications (Other):    Ascorbic Acid (VITAMIN C) 1000 MG tablet, Take 1,000 mg by mouth daily.   calcium carbonate (OS-CAL) 600 MG TABS tablet, Take 600 mg by mouth daily with breakfast.   Cholecalciferol (VITAMIN D3) 125 MCG (5000 UT) CAPS, Take 5,000 Units by mouth daily.   fish oil-omega-3 fatty acids 1000 MG capsule, Take 1 g by mouth 3 (three) times daily with meals.    Flaxseed, Linseed, (FLAXSEED OIL PO), Take 1 capsule by mouth 3 (three) times daily with meals.    Multiple Vitamin (MULTIVITAMIN) tablet,  Take 1 tablet by mouth every morning.    OVER THE COUNTER MEDICATION, Takes Allergy meds as needed   zinc gluconate 50 MG tablet, Take 50 mg by mouth daily.  Allergies Allergies  Allergen Reactions   Amoxil [Amoxicillin] Rash   Ampicillin Rash    Current Problems (verified) Patient Active Problem List   Diagnosis Date Noted   Abnormal glucose 08/20/2018   History of thyroiditis 03/26/2018   Osteopenia of left femoral neck 03/23/2018   Cystocele with prolapse 03/23/2018   BMI 22.0-22.9, adult 06/01/2017   Anemia, iron deficiency 10/16/2015   Medication management 07/26/2014   Hyperlipidemia, mixed    Seasonal allergies    Vitamin D deficiency    Hypertension    History of colonic polyps 03/23/2011    Screening Tests Immunization History  Administered Date(s) Administered   Influenza Inj Mdck Quad With Preservative 01/03/2020   Influenza Split 01/03/2013   Influenza, High Dose Seasonal PF 10/23/2015, 11/08/2016, 11/25/2017, 10/04/2018   PPD Test 07/13/2013   Pneumococcal Conjugate-13 01/08/2016   Pneumococcal Polysaccharide-23 12/19/2017   Pneumococcal-Unspecified 01/18/2002   Tdap 01/19/2003, 07/05/2012   Zoster 07/05/2012   Health Maintenance  Topic Date Due   COVID-19 Vaccine (1) Never done   MAMMOGRAM  01/29/2020   COLONOSCOPY  02/15/2021   TETANUS/TDAP  07/06/2022   INFLUENZA VACCINE  Completed   DEXA SCAN  Completed   Hepatitis C Screening  Completed   PNA vac Low Risk Adult  Completed   Preventative care: Last colonoscopy: 01/2016 Last mammogram: 01/2019 Last pap smear/pelvic exam: 2015 DEXA: 01/2016  Prior vaccinations: COVID: declines- discussed and given information Shingrax- has not had yet- discussed   Names of Other Physician/Practitioners you currently use: 1. [facility] here for primary care Patient Care Team: Unk Pinto, MD as PCP - General (Internal  Medicine)  SURGICAL HISTORY She  has a past surgical history that includes Cardiac surgery (1958); Colonoscopy; Cervix lesion destruction (N/A, ? years ago); Tubal ligation; Colonoscopy (N/A, 02/16/2016); and Cryotherapy. FAMILY HISTORY Her family history includes Colon cancer (age of onset: 64) in her sister; Diabetes in her brother, father, sister, and sister. SOCIAL HISTORY She  reports that she has never smoked. She has never used smokeless tobacco. She reports current alcohol use. She reports that she does not use drugs.    Review of Systems  Constitutional: Negative.   HENT: Negative.   Eyes: Negative.   Respiratory: Negative.   Cardiovascular: Negative.   Gastrointestinal: Negative.   Genitourinary: Negative.   Musculoskeletal: Negative.   Skin: Negative.   Neurological: Negative.   Endo/Heme/Allergies: Negative.   Psychiatric/Behavioral: Negative.      Objective:     Today's Vitals   01/03/20 1102  BP: 124/78  Pulse: 63  Temp: (!) 97.3 F (36.3 C)  SpO2: 96%  Weight: 144 lb (65.3 kg)   Body mass index is 21.9 kg/m.  General appearance: alert, no distress, WD/WN, female HEENT: normocephalic, sclerae anicteric, TMs pearly, nares patent, no discharge or erythema, pharynx normal Oral cavity: MMM, no lesions Neck: supple, no lymphadenopathy, no thyromegaly, no masses Heart: RRR, normal S1, S2, no murmurs Lungs: CTA bilaterally, no wheezes, rhonchi, or rales Abdomen: +bs, soft, non tender, non distended, no masses, no hepatomegaly, no splenomegaly Musculoskeletal: nontender, no swelling, no obvious deformity. Patient is able to ambulate well.  Gait is not  Antalgic. pulses full,Good distal sensations and pulses bilaterally. Extremities: no edema, no cyanosis, no clubbing Pulses: 2+ symmetric, upper and lower extremities, normal cap refill Neurological: alert, oriented x 3, CN2-12 intact, strength normal upper extremities and lower extremities, sensation normal  throughout, DTRs 2+ throughout, no cerebellar signs, gait normal Psychiatric: normal affect, behavior normal, pleasant     Bayard Males, DNP [facility] 01/03/2020  11:29 AM

## 2020-01-03 NOTE — Patient Instructions (Addendum)
  You received the high dose influenza vaccination today 01/03/20   We will contact you via MyChart with your lab results in 1-3 days.  It is ok to take ibuprofen to your hip pain.  Take this with food.  Ibuprofen 800mg  every 8 hours or 600mg  every 6 hours.    GENERAL HEALTH GOALS  Know what a healthy weight is for you (roughly BMI <25) and aim to maintain this  Aim for 7+ servings of fruits and vegetables daily  70-80+ fluid ounces of water or unsweet tea for healthy kidneys  Limit to max 1 drink of alcohol per day; avoid smoking/tobacco  Limit animal fats in diet for cholesterol and heart health - choose grass fed whenever available  Avoid highly processed foods, and foods high in saturated/trans fats  Aim for low stress - take time to unwind and care for your mental health  Aim for 150 min of moderate intensity exercise weekly for heart health, and weights twice weekly for bone health  Aim for 7-9 hours of sleep daily

## 2020-01-04 LAB — COMPLETE METABOLIC PANEL WITH GFR
AG Ratio: 2 (calc) (ref 1.0–2.5)
ALT: 15 U/L (ref 6–29)
AST: 16 U/L (ref 10–35)
Albumin: 4.3 g/dL (ref 3.6–5.1)
Alkaline phosphatase (APISO): 52 U/L (ref 37–153)
BUN: 19 mg/dL (ref 7–25)
CO2: 30 mmol/L (ref 20–32)
Calcium: 9.6 mg/dL (ref 8.6–10.4)
Chloride: 103 mmol/L (ref 98–110)
Creat: 0.65 mg/dL (ref 0.50–0.99)
GFR, Est African American: 105 mL/min/{1.73_m2} (ref >=60)
GFR, Est Non African American: 91 mL/min/{1.73_m2} (ref >=60)
Globulin: 2.1 g/dL (calc) (ref 1.9–3.7)
Glucose, Bld: 99 mg/dL (ref 65–99)
Potassium: 4.4 mmol/L (ref 3.5–5.3)
Sodium: 140 mmol/L (ref 135–146)
Total Bilirubin: 0.4 mg/dL (ref 0.2–1.2)
Total Protein: 6.4 g/dL (ref 6.1–8.1)

## 2020-01-04 LAB — CBC WITH DIFFERENTIAL/PLATELET
Absolute Monocytes: 529 cells/uL (ref 200–950)
Basophils Absolute: 38 cells/uL (ref 0–200)
Basophils Relative: 0.6 %
Eosinophils Absolute: 101 cells/uL (ref 15–500)
Eosinophils Relative: 1.6 %
HCT: 36.9 % (ref 35.0–45.0)
Hemoglobin: 12.6 g/dL (ref 11.7–15.5)
Lymphs Abs: 1840 cells/uL (ref 850–3900)
MCH: 31.3 pg (ref 27.0–33.0)
MCHC: 34.1 g/dL (ref 32.0–36.0)
MCV: 91.6 fL (ref 80.0–100.0)
MPV: 10.7 fL (ref 7.5–12.5)
Monocytes Relative: 8.4 %
Neutro Abs: 3793 cells/uL (ref 1500–7800)
Neutrophils Relative %: 60.2 %
Platelets: 274 10*3/uL (ref 140–400)
RBC: 4.03 10*6/uL (ref 3.80–5.10)
RDW: 12.2 % (ref 11.0–15.0)
Total Lymphocyte: 29.2 %
WBC: 6.3 10*3/uL (ref 3.8–10.8)

## 2020-01-15 DIAGNOSIS — H524 Presbyopia: Secondary | ICD-10-CM | POA: Diagnosis not present

## 2020-01-15 DIAGNOSIS — H40013 Open angle with borderline findings, low risk, bilateral: Secondary | ICD-10-CM | POA: Diagnosis not present

## 2020-01-23 ENCOUNTER — Other Ambulatory Visit: Payer: Self-pay | Admitting: Internal Medicine

## 2020-01-26 ENCOUNTER — Other Ambulatory Visit: Payer: Self-pay | Admitting: Adult Health

## 2020-01-26 MED ORDER — AMLODIPINE BESYLATE 5 MG PO TABS: ORAL_TABLET | ORAL | 1 refills | Status: DC

## 2020-01-26 MED ORDER — AMLODIPINE BESYLATE 2.5 MG PO TABS: ORAL_TABLET | ORAL | 1 refills | Status: DC

## 2020-01-31 ENCOUNTER — Ambulatory Visit: Payer: Medicare HMO

## 2020-03-12 ENCOUNTER — Ambulatory Visit
Admission: RE | Admit: 2020-03-12 | Discharge: 2020-03-12 | Disposition: A | Discharge status: Home / Self Care | Payer: Medicare HMO | Source: Ambulatory Visit | Attending: Internal Medicine | Admitting: Internal Medicine

## 2020-03-12 ENCOUNTER — Other Ambulatory Visit: Payer: Self-pay

## 2020-03-12 DIAGNOSIS — Z1231 Encounter for screening mammogram for malignant neoplasm of breast: Secondary | ICD-10-CM

## 2020-03-19 ENCOUNTER — Encounter: Payer: Medicare HMO | Admitting: Internal Medicine

## 2020-04-09 ENCOUNTER — Ambulatory Visit (INDEPENDENT_AMBULATORY_CARE_PROVIDER_SITE_OTHER): Payer: Medicare HMO | Admitting: Adult Health Nurse Practitioner

## 2020-04-09 ENCOUNTER — Other Ambulatory Visit: Payer: Self-pay

## 2020-04-09 ENCOUNTER — Encounter: Payer: Self-pay | Admitting: Adult Health Nurse Practitioner

## 2020-04-09 VITALS — BP 142/80 | HR 68 | Temp 97.7°F | Ht 68.5 in | Wt 149.0 lb

## 2020-04-09 DIAGNOSIS — D509 Iron deficiency anemia, unspecified: Secondary | ICD-10-CM | POA: Diagnosis not present

## 2020-04-09 DIAGNOSIS — Z131 Encounter for screening for diabetes mellitus: Secondary | ICD-10-CM | POA: Diagnosis not present

## 2020-04-09 DIAGNOSIS — E559 Vitamin D deficiency, unspecified: Secondary | ICD-10-CM | POA: Diagnosis not present

## 2020-04-09 DIAGNOSIS — Z79899 Other long term (current) drug therapy: Secondary | ICD-10-CM

## 2020-04-09 DIAGNOSIS — Z1389 Encounter for screening for other disorder: Secondary | ICD-10-CM

## 2020-04-09 DIAGNOSIS — E538 Deficiency of other specified B group vitamins: Secondary | ICD-10-CM | POA: Diagnosis not present

## 2020-04-09 DIAGNOSIS — J302 Other seasonal allergic rhinitis: Secondary | ICD-10-CM

## 2020-04-09 DIAGNOSIS — I10 Essential (primary) hypertension: Secondary | ICD-10-CM

## 2020-04-09 DIAGNOSIS — Z0001 Encounter for general adult medical examination with abnormal findings: Secondary | ICD-10-CM

## 2020-04-09 DIAGNOSIS — Z0000 Encounter for general adult medical examination without abnormal findings: Secondary | ICD-10-CM | POA: Diagnosis not present

## 2020-04-09 DIAGNOSIS — R7309 Other abnormal glucose: Secondary | ICD-10-CM

## 2020-04-09 DIAGNOSIS — Z136 Encounter for screening for cardiovascular disorders: Secondary | ICD-10-CM

## 2020-04-09 DIAGNOSIS — M25551 Pain in right hip: Secondary | ICD-10-CM

## 2020-04-09 DIAGNOSIS — E782 Mixed hyperlipidemia: Secondary | ICD-10-CM | POA: Diagnosis not present

## 2020-04-09 DIAGNOSIS — Z6822 Body mass index (BMI) 22.0-22.9, adult: Secondary | ICD-10-CM

## 2020-04-09 NOTE — Patient Instructions (Signed)
    Hip Flexor Strengthening    The hip flexor muscle group is comprised of the psoas major & minor, iliacus, and rectus femoris. These originate at your spine and connect your hips to the joint, down your thigh (rectus femoris) connecting to femor bone in your thigh and ending in the knee joint.  People weak or tight hip flexors can experience back or hip pain as well as pain down their thigh.  These muscles let you to walk, kick, bend, and swivel your hips  Below are stretches / exercises to strengthen/stretch these muscles. *The information is from [facility] in [place], [place].   Supine Hip Flexor Activation  This exercise can be done using either a squat rack or doorway. 1. Lie down on your back and position yourself with one foot resting on the rack or doorway and the other leg straight. 2. Your thigh should be positioned so your hip is just past a 90 degree angle towards your chest. 3. Begin to contract your deep hip flexors by lightly driving your knee towards your chest. This will lift your foot away from the squat rack or doorway. 4. At the same time, your other leg should be counteracting the motion by straightening and pressing down hard into the ground. Keep the back straight.  5. Hold for 3 seconds and then release to starting position.  6. Perform 5-10 reps for 1-3 sets per side or until fatigue.   Psoas March  Lay on your back with your knees bent in at a right angle and a neutral spine. 1. Wrap a tension band around your feet, keeping tension throughout the exercise. 2. Extend one leg and slowly bring the leg back, then repeat on the other side.  3. Complete 1-3 sets of 8 to 12 reps.    Straight leg raise   Sit on the floor with on leg extended and back straight. 1. Hug the other knee to your chest. 2. Engage your core and turn the other leg slightly outwards. 3. Begin to slowly lift your leg off the ground. 4. Hold for one second and then  slowly lower leg to the ground. 5. Perform 2-4 sets per side until failure. These exercises are deceptively challenging, especially if you have weak/tight hip flexors. Start off slow and if you feel a pinch or sharp feeling in the front of the hip, stop immediately and see a rehab practitioner to assess your hips.    Below are stretches for low back pain but also for Core Strengthening.  Everything is connected

## 2020-04-09 NOTE — Progress Notes (Signed)
COMPLETE PHYSICAL  Assessment / Plan:   Diagnoses and all orders for this visit:  Encounter for general adult medical examination with abnormal findings Yearly  BMI 22.0-22.9, adult Discussed dietary and exercise modifications  Essential hypertension Continue current medications: amlodipine 5mg   Monitor blood pressure at home; call if consistently over 130/80 Continue DASH diet.   Reminder to go to the ER if any CP, SOB, nausea, dizziness, severe HA, changes vision/speech, left arm numbness and tingling and jaw pain. -     CBC with Differential/Platelet -     TSH -     COMPLETE METABOLIC PANEL WITH GFR -     EKG 12-Lead  Hyperlipidemia, mixed Continue medications: Omega3 1,000mg  TID Discussed dietary and exercise modifications Low fat diet -     Lipid panel  Iron deficiency anemia, unspecified iron deficiency anemia type -     Iron,Total/Total Iron Binding Cap  Medication management Continued  Vitamin D deficiency Continue supplementation to maintain goal of 70-100 Taking Vitamin D 5,000 IU daily -     VITAMIN D 25 Hydroxy (Vit-D Deficiency, Fractures)  Seasonal allergies Doing well at this time OTC PRN  Screening, ischemic heart disease -     EKG 12-Lead  Screening for blood or protein in urine -     Urinalysis w microscopic + reflex cultur  Screening for diabetes mellitus -     Hemoglobin A1c  Abnormal glucose -     Hemoglobin A1c  B12 deficiency -     Vitamin B12  Other orders -     REFLEXIVE URINE CULTURE   Right Hip pain Follows with [facility]  RICE, and exercises given S/P injection   Further disposition pending results if labs check today. Discussed med's effects and SE's.   Over 30 minutes of face to face interview, exam, counseling, chart review, and critical decision making was performed.   Future Appointments  Date Time Provider [place]  04/09/2020  2:00 PM Garnet Sierras, NP [facility] None  07/16/2020 11:00 AM  Garnet Sierras, NP [facility] None  04/09/2021  2:00 PM Garnet Sierras, NP [facility] None     Subjective:  [name] [name] is a 70 y.o. female who presents for Medicare Annual Wellness Visit and 3 month follow up.   She has itching in throat, sore throat, felt better with water. She has slight cough. She is not taking allergy meds at this time. No fever, no chills.   She has right anterior hip pain with positioning her leg a certain way, worse x oct 2020.  She now follows with [facility] and is s/p injection.  Reports this has helped some but not resolved.  She is not participating in PT or doing any type of stretching exercises.. She states worse with taking off her sock/shoe, crossing her right leg over her left. When she goes from bending to standing having pain, but no pain with walking/stairs. No numbness tingling in leg, pain down leg.  Plans to follow up.  Discussed exercises to help strengthen surrounding muscles.     Her blood pressure has been controlled at home, today their BP is BP: (!) 142/80 She does workout. She denies chest pain, shortness of breath, dizziness.   BMI is Body mass index is 22.33 kg/m., she is working on diet and exercise. Wt Readings from Last 3 Encounters:  04/09/20 149 lb (67.6 kg)  01/03/20 144 lb (65.3 kg)  12/27/19 144 lb 9.6 oz (65.6 kg)   She is on  cholesterol medication and denies myalgias. Her cholesterol is at goal. The cholesterol last visit was:   Lab Results  Component Value Date   CHOL 157 09/26/2019   HDL 54 09/26/2019   LDLCALC 79 09/26/2019   TRIG 147 09/26/2019   CHOLHDL 2.9 09/26/2019    Last A1C in the office was:  Lab Results  Component Value Date   HGBA1C 5.5 09/26/2019   Last GFR: Lab Results  Component Value Date   GFRNONAA 91 01/03/2020   Patient is on Vitamin D supplement.   Lab Results  Component Value Date   VD25OH 75 09/26/2019      Medication Review:   Current Outpatient Medications  (Cardiovascular):  .  amLODipine (NORVASC) 5 MG tablet, Take 1 tablet by mouth once daily for blood pressure   Current Outpatient Medications (Analgesics):  .  acetaminophen (TYLENOL) 325 MG tablet, Take 2 tablets (650 mg total) by mouth every 6 (six) hours as needed for mild pain (or Fever >/= 101). Marland Kitchen  aspirin EC 81 MG tablet, Take 81 mg by mouth every other day.    Current Outpatient Medications (Other):  Marland Kitchen  Ascorbic Acid (VITAMIN C) 1000 MG tablet, Take 1,000 mg by mouth daily. .  calcium carbonate (OS-CAL) 600 MG TABS tablet, Take 600 mg by mouth daily with breakfast. .  Cholecalciferol (VITAMIN D3) 125 MCG (5000 UT) CAPS, Take 5,000 Units by mouth daily. .  fish oil-omega-3 fatty acids 1000 MG capsule, Take 1 g by mouth 3 (three) times daily with meals.  .  Flaxseed, Linseed, (FLAXSEED OIL PO), Take 1 capsule by mouth 3 (three) times daily with meals.  .  Multiple Vitamin (MULTIVITAMIN) tablet, Take 1 tablet by mouth every morning.  Marland Kitchen  OVER THE COUNTER MEDICATION, Takes Allergy meds as needed .  zinc gluconate 50 MG tablet, Take 50 mg by mouth daily.  Allergies Allergies  Allergen Reactions  . Amoxil [Amoxicillin] Rash  . Ampicillin Rash    Current Problems (verified) Patient Active Problem List   Diagnosis Date Noted  . Abnormal glucose 08/20/2018  . History of thyroiditis 03/26/2018  . Osteopenia of left femoral neck 03/23/2018  . Cystocele with prolapse 03/23/2018  . BMI 22.0-22.9, adult 06/01/2017  . Anemia, iron deficiency 10/16/2015  . Medication management 07/26/2014  . Hyperlipidemia, mixed   . Seasonal allergies   . Vitamin D deficiency   . Hypertension   . History of colonic polyps 03/23/2011    Screening Tests Immunization History  Administered Date(s) Administered  . Influenza Inj Mdck Quad With Preservative 01/03/2020  . Influenza Split 01/03/2013  . Influenza, High Dose Seasonal PF 10/23/2015, 11/08/2016, 11/25/2017, 10/04/2018  . PPD Test  07/13/2013  . Pneumococcal Conjugate-13 01/08/2016  . Pneumococcal Polysaccharide-23 12/19/2017  . Pneumococcal-Unspecified 01/18/2002  . Tdap 01/19/2003, 07/05/2012  . Zoster 07/05/2012   Health Maintenance  Topic Date Due  . COVID-19 Vaccine (1) Never done  . COLONOSCOPY (Pts 45-37yrs Insurance coverage will need to be confirmed)  02/15/2021  . MAMMOGRAM  03/12/2021  . TETANUS/TDAP  07/06/2022  . INFLUENZA VACCINE  Completed  . DEXA SCAN  Completed  . Hepatitis C Screening  Completed  . PNA vac Low Risk Adult  Completed  . HPV VACCINES  Aged Out   Preventative care: Last colonoscopy: 01/2016 Last mammogram: 01/2020 Last pap smear/pelvic exam: 2021 Dr Talbert Nan DEXA: 03/2019  Prior vaccinations: COVID: declines- discussed  Shingrax- has not had yet- discussed  Names of Other Physician/Practitioners you currently use: 1.  [facility] here for primary care Patient Care Team: Unk Pinto, MD as PCP - General (Internal Medicine)  2. Eye Exam: 2022 3. Dental Exam: 2022 Q16months  SURGICAL HISTORY She  has a past surgical history that includes Cardiac surgery (1958); Colonoscopy; Cervix lesion destruction (N/A, ? years ago); Tubal ligation; Colonoscopy (N/A, 02/16/2016); and Cryotherapy. FAMILY HISTORY Her family history includes Colon cancer (age of onset: 38) in her sister; Diabetes in her brother, father, sister, and sister. SOCIAL HISTORY She  reports that she has never smoked. She has never used smokeless tobacco. She reports current alcohol use. She reports that she does not use drugs.  Review of Systems  Constitutional: Negative for chills, diaphoresis, fever, malaise/fatigue and weight loss.  HENT: Negative for congestion, ear discharge, ear pain, hearing loss, nosebleeds, sinus pain, sore throat and tinnitus.   Eyes: Negative for blurred vision, double vision, photophobia, pain, discharge and redness.  Respiratory: Negative for  cough, hemoptysis, sputum production, shortness of breath, wheezing and stridor.   Cardiovascular: Negative for chest pain, palpitations, orthopnea, claudication, leg swelling and PND.  Gastrointestinal: Negative for abdominal pain, blood in stool, constipation, diarrhea, heartburn, melena, nausea and vomiting.  Genitourinary: Negative for dysuria, flank pain, frequency, hematuria and urgency.  Musculoskeletal: Negative for back pain, falls, joint pain, myalgias and neck pain.       Right hip pain, intermittent  Skin: Negative for itching and rash.  Neurological: Negative for dizziness, tingling, tremors, sensory change, speech change, focal weakness, seizures, loss of consciousness, weakness and headaches.  Endo/Heme/Allergies: Negative for environmental allergies and polydipsia. Does not bruise/bleed easily.  Psychiatric/Behavioral: Negative for depression, hallucinations, memory loss, substance abuse and suicidal ideas. The patient is not nervous/anxious and does not have insomnia.      Objective:     Today's Vitals   04/09/20 1350  BP: (!) 142/80  Pulse: 68  Temp: 97.7 F (36.5 C)  SpO2: 97%  Weight: 149 lb (67.6 kg)  Height: 5' 8.5" (1.74 m)   Body mass index is 22.33 kg/m.  General appearance: alert, no distress, WD/WN, female HEENT: normocephalic, sclerae anicteric, TMs pearly, nares patent, no discharge or erythema, pharynx normal Oral cavity: MMM, no lesions Neck: supple, no lymphadenopathy, no thyromegaly, no masses Heart: RRR, normal S1, S2, no murmurs Lungs: CTA bilaterally, no wheezes, rhonchi, or rales Abdomen: +bs, soft, non tender, non distended, no masses, no hepatomegaly, no splenomegaly Musculoskeletal: nontender, no swelling, no obvious deformity. Patient is able to ambulate well.  Gait is not  antalgic.Right hip: positives: pain with movement of hip and pain with rotation and negatives: FROM no pain with heel impact pulses full without SI pain.Good distal  sensations and pulses bilaterally. Extremities: no edema, no cyanosis, no clubbing Pulses: 2+ symmetric, upper and lower extremities, normal cap refill Neurological: alert, oriented x 3, CN2-12 intact, strength normal upper extremities and lower extremities, sensation normal throughout, DTRs 2+ throughout, no cerebellar signs, gait normal Psychiatric: normal affect, behavior normal, pleasant    Garnet Sierras, Laqueta Jean, DNP [facility] 04/09/2020  2:42 PM

## 2020-04-10 LAB — URINALYSIS W MICROSCOPIC + REFLEX CULTURE
Bacteria, UA: NONE SEEN /HPF
Bilirubin Urine: NEGATIVE
Glucose, UA: NEGATIVE
Hgb urine dipstick: NEGATIVE
Hyaline Cast: NONE SEEN /LPF
Ketones, ur: NEGATIVE
Leukocyte Esterase: NEGATIVE
Nitrites, Initial: NEGATIVE
Protein, ur: NEGATIVE
RBC / HPF: NONE SEEN /HPF (ref 0–2)
Specific Gravity, Urine: 1.005 (ref 1.001–1.03)
Squamous Epithelial / HPF: NONE SEEN /HPF (ref <=5)
WBC, UA: NONE SEEN /HPF (ref 0–5)
pH: 6 (ref 5.0–8.0)

## 2020-04-10 LAB — TSH: TSH: 2.96 mIU/L (ref 0.40–4.50)

## 2020-04-10 LAB — LIPID PANEL
Cholesterol: 196 mg/dL (ref <200)
HDL: 71 mg/dL (ref >=50)
LDL Cholesterol (Calc): 107 mg/dL (calc) — ABNORMAL HIGH
Non-HDL Cholesterol (Calc): 125 mg/dL (calc) (ref <130)
Total CHOL/HDL Ratio: 2.8 (calc) (ref <5.0)
Triglycerides: 86 mg/dL (ref <150)

## 2020-04-10 LAB — NO CULTURE INDICATED

## 2020-04-10 LAB — COMPLETE METABOLIC PANEL WITH GFR
AG Ratio: 1.8 (calc) (ref 1.0–2.5)
ALT: 16 U/L (ref 6–29)
AST: 20 U/L (ref 10–35)
Albumin: 4.8 g/dL (ref 3.6–5.1)
Alkaline phosphatase (APISO): 51 U/L (ref 37–153)
BUN: 16 mg/dL (ref 7–25)
CO2: 31 mmol/L (ref 20–32)
Calcium: 10.6 mg/dL — ABNORMAL HIGH (ref 8.6–10.4)
Chloride: 100 mmol/L (ref 98–110)
Creat: 0.61 mg/dL (ref 0.50–0.99)
GFR, Est African American: 107 mL/min/{1.73_m2} (ref >=60)
GFR, Est Non African American: 92 mL/min/{1.73_m2} (ref >=60)
Globulin: 2.7 g/dL (calc) (ref 1.9–3.7)
Glucose, Bld: 86 mg/dL (ref 65–99)
Potassium: 4.4 mmol/L (ref 3.5–5.3)
Sodium: 140 mmol/L (ref 135–146)
Total Bilirubin: 0.4 mg/dL (ref 0.2–1.2)
Total Protein: 7.5 g/dL (ref 6.1–8.1)

## 2020-04-10 LAB — CBC WITH DIFFERENTIAL/PLATELET
Absolute Monocytes: 490 cells/uL (ref 200–950)
Basophils Absolute: 50 cells/uL (ref 0–200)
Basophils Relative: 0.8 %
Eosinophils Absolute: 81 cells/uL (ref 15–500)
Eosinophils Relative: 1.3 %
HCT: 37.7 % (ref 35.0–45.0)
Hemoglobin: 12.6 g/dL (ref 11.7–15.5)
Lymphs Abs: 1910 cells/uL (ref 850–3900)
MCH: 30.6 pg (ref 27.0–33.0)
MCHC: 33.4 g/dL (ref 32.0–36.0)
MCV: 91.5 fL (ref 80.0–100.0)
MPV: 11 fL (ref 7.5–12.5)
Monocytes Relative: 7.9 %
Neutro Abs: 3670 cells/uL (ref 1500–7800)
Neutrophils Relative %: 59.2 %
Platelets: 288 10*3/uL (ref 140–400)
RBC: 4.12 10*6/uL (ref 3.80–5.10)
RDW: 12.1 % (ref 11.0–15.0)
Total Lymphocyte: 30.8 %
WBC: 6.2 10*3/uL (ref 3.8–10.8)

## 2020-04-10 LAB — IRON, TOTAL/TOTAL IRON BINDING CAP
%SAT: 23 % (calc) (ref 16–45)
Iron: 87 ug/dL (ref 45–160)
TIBC: 378 mcg/dL (calc) (ref 250–450)

## 2020-04-10 LAB — VITAMIN B12: Vitamin B-12: 302 pg/mL (ref 200–1100)

## 2020-04-10 LAB — HEMOGLOBIN A1C
Hgb A1c MFr Bld: 5.6 % of total Hgb (ref <5.7)
Mean Plasma Glucose: 114 mg/dL
eAG (mmol/L): 6.3 mmol/L

## 2020-04-10 LAB — VITAMIN D 25 HYDROXY (VIT D DEFICIENCY, FRACTURES): Vit D, 25-Hydroxy: 68 ng/mL (ref 30–100)

## 2020-04-17 DIAGNOSIS — M1611 Unilateral primary osteoarthritis, right hip: Secondary | ICD-10-CM | POA: Diagnosis not present

## 2020-04-29 DIAGNOSIS — M1611 Unilateral primary osteoarthritis, right hip: Secondary | ICD-10-CM | POA: Diagnosis not present

## 2020-05-06 DIAGNOSIS — M1611 Unilateral primary osteoarthritis, right hip: Secondary | ICD-10-CM | POA: Diagnosis not present

## 2020-05-15 DIAGNOSIS — H40013 Open angle with borderline findings, low risk, bilateral: Secondary | ICD-10-CM | POA: Diagnosis not present

## 2020-05-20 DIAGNOSIS — M25551 Pain in right hip: Secondary | ICD-10-CM | POA: Diagnosis not present

## 2020-05-20 DIAGNOSIS — M1611 Unilateral primary osteoarthritis, right hip: Secondary | ICD-10-CM | POA: Diagnosis not present

## 2020-07-11 ENCOUNTER — Encounter: Payer: Self-pay | Admitting: Adult Health

## 2020-07-11 NOTE — Progress Notes (Signed)
ANNUAL MEDICARE WELLNESS  Assessment / Plan:   Diagnoses and all orders for this visit:  Annual Medicare Wellness Visit Due annually  Discussed shingrix - check with insuranc4  Essential hypertension Continue current medications: amlodipine 5mg   Monitor blood pressure at home; call if consistently over 130/80 Continue DASH diet.   Reminder to go to the ER if any CP, SOB, nausea, dizziness, severe HA, changes vision/speech, left arm numbness and tingling and jaw pain. -     CBC with Differential/Platelet -     TSH -     COMPLETE METABOLIC PANEL WITH GFR  Hyperlipidemia, mixed Continue medications: Omega3  Discussed dietary and exercise modifications Low fat diet -     Lipid panel -     TSH  Medication management Continued  Vitamin D deficiency Continue supplementation to maintain goal of 70-100 Taking Vitamin D 5,000 IU daily  Seasonal allergies Doing well at this time OTC PRN  BMI 21.0-21.9, adult Discussed dietary and exercise modifications  Right Hip pain Follows with [facility]  RICE, and exercises given S/P injection   Further disposition pending results if labs check today. Discussed med's effects and SE's.   Over 30 minutes of face to face interview, exam, counseling, chart review, and critical decision making was performed.   Future Appointments  Date Time Provider [place]  04/09/2021  2:00 PM Garnet Sierras, NP [facility] None  07/14/2021  9:30 AM Liane Comber, NP [facility] None     Subjective:  DEBORRAH [name] is a 70 y.o. female who presents for Medicare Annual Wellness Visit and 3 month follow up.   She has right anterior hip pain, follows with [facility] and is s/p injection x 2 .  Reports this has helped some but not resolved. Only bothers with certain movements, managing, postponing on surgery.   BMI is Body mass index is 21.88 kg/m., she has been working on diet and not dedicated exercise "but I never sit" - has new  puppy and watches grandkids.  Wt Readings from Last 3 Encounters:  07/14/20 146 lb (66.2 kg)  04/09/20 149 lb (67.6 kg)  01/03/20 144 lb (65.3 kg)    Her blood pressure has been controlled at home, today their BP is BP: 128/78 She does workout. She denies chest pain, shortness of breath, dizziness.   She is not on cholesterol medication (flax seed, fish oil). Her cholesterol is not at goal. The cholesterol last visit was:   Lab Results  Component Value Date   CHOL 196 04/09/2020   HDL 71 04/09/2020   LDLCALC 107 (H) 04/09/2020   TRIG 86 04/09/2020   CHOLHDL 2.8 04/09/2020    Last A1C in the office was:  Lab Results  Component Value Date   HGBA1C 5.6 04/09/2020   Last GFR: Lab Results  Component Value Date   GFRNONAA 92 04/09/2020   Patient is on Vitamin D supplement.   Lab Results  Component Value Date   VD25OH 68 04/09/2020      Medication Review:   Current Outpatient Medications (Cardiovascular):    amLODipine (NORVASC) 5 MG tablet, Take 1 tablet by mouth once daily for blood pressure   Current Outpatient Medications (Analgesics):    acetaminophen (TYLENOL) 325 MG tablet, Take 2 tablets (650 mg total) by mouth every 6 (six) hours as needed for mild pain (or Fever >/= 101).   aspirin EC 81 MG tablet, Take 81 mg by mouth every other day.    Current Outpatient Medications (Other):  Ascorbic Acid (VITAMIN C) 1000 MG tablet, Take 1,000 mg by mouth daily.   calcium carbonate (OS-CAL) 600 MG TABS tablet, Take 600 mg by mouth daily with breakfast.   Cholecalciferol (VITAMIN D3) 125 MCG (5000 UT) CAPS, Take 5,000 Units by mouth daily.   fish oil-omega-3 fatty acids 1000 MG capsule, Take 1 g by mouth 3 (three) times daily with meals.    Flaxseed, Linseed, (FLAXSEED OIL PO), Take 1 capsule by mouth 3 (three) times daily with meals.    Multiple Vitamin (MULTIVITAMIN) tablet, Take 1 tablet by mouth every morning.    OVER THE COUNTER MEDICATION, Takes Allergy meds as  needed   zinc gluconate 50 MG tablet, Take 50 mg by mouth daily.  Allergies Allergies  Allergen Reactions   Amoxil [Amoxicillin] Rash   Ampicillin Rash    Current Problems (verified) Patient Active Problem List   Diagnosis Date Noted   Abnormal glucose 08/20/2018   History of thyroiditis 03/26/2018   Osteopenia of left femoral neck 03/23/2018   Cystocele with prolapse 03/23/2018   BMI 22.0-22.9, adult 06/01/2017   Anemia, iron deficiency 10/16/2015   Medication management 07/26/2014   Hyperlipidemia, mixed    Seasonal allergies    Vitamin D deficiency    Hypertension    History of colonic polyps 03/23/2011    Screening Tests Immunization History  Administered Date(s) Administered   Influenza Inj Mdck Quad With Preservative 01/03/2020   Influenza Split 01/03/2013   Influenza, High Dose Seasonal PF 10/23/2015, 11/08/2016, 11/25/2017, 10/04/2018   PPD Test 07/13/2013   Pneumococcal Conjugate-13 01/08/2016   Pneumococcal Polysaccharide-23 12/19/2017   Pneumococcal-Unspecified 01/18/2002   Tdap 01/19/2003, 07/05/2012   Zoster, Live 07/05/2012    Preventative care: Last colonoscopy: 01/2016, 5 year recall per Dr. Fuller Plan  Last mammogram: 01/2020 Last pap smear/pelvic exam: 2021 Dr Talbert Nan DEXA: 03/2019 - T -1.2  Prior vaccinations:  Influenza: 2021 Pneumonia - 2/2, last 2019 COVID: declines- discussed  Shingrix- has not had yet- discussed  Names of Other Physician/Practitioners you currently use: 1. [facility] here for primary care 2. Eye Exam: Dr. Delman Cheadle, 2022 3. Dental Exam: Dr. Radford Pax, 2022 Q20months  Patient Care Team: Unk Pinto, MD as PCP - General (Internal Medicine)   SURGICAL HISTORY She  has a past surgical history that includes Cardiac surgery (1958); Colonoscopy; Cervix lesion destruction (N/A, ? years ago); Tubal ligation; Colonoscopy (N/A, 02/16/2016); and Cryotherapy. FAMILY HISTORY Her family history  includes Colon cancer (age of onset: 60) in her sister; Diabetes in her brother, father, sister, and sister. SOCIAL HISTORY She  reports that she has never smoked. She has never used smokeless tobacco. She reports current alcohol use. She reports that she does not use drugs.  Review of Systems  Constitutional:  Negative for malaise/fatigue and weight loss.  HENT:  Negative for hearing loss and tinnitus.   Eyes:  Negative for blurred vision and double vision.  Respiratory:  Negative for cough, sputum production, shortness of breath and wheezing.   Cardiovascular:  Negative for chest pain, palpitations, orthopnea, claudication, leg swelling and PND.  Gastrointestinal:  Negative for abdominal pain, blood in stool, constipation, diarrhea, heartburn, melena, nausea and vomiting.  Genitourinary: Negative.   Musculoskeletal:  Positive for joint pain (R hip pain intermittently). Negative for falls and myalgias.  Skin:  Negative for rash.  Neurological:  Negative for dizziness, tingling, sensory change, weakness and headaches.  Endo/Heme/Allergies:  Negative for polydipsia.  Psychiatric/Behavioral: Negative.  Negative for depression, memory loss,  substance abuse and suicidal ideas. The patient is not nervous/anxious and does not have insomnia.   All other systems reviewed and are negative.   Objective:     Today's Vitals   07/14/20 0913  BP: 128/78  Pulse: 71  Temp: (!) 97.5 F (36.4 C)  SpO2: 96%  Weight: 146 lb (66.2 kg)  Height: 5' 8.5" (1.74 m)   Body mass index is 21.88 kg/m.  General appearance: alert, no distress, WD/WN, female HEENT: normocephalic, sclerae anicteric, TMs pearly, nares patent, no discharge or erythema, pharynx normal Oral cavity: MMM, no lesions Neck: supple, no lymphadenopathy, no thyromegaly, no masses Heart: RRR, normal S1, S2, no murmurs Lungs: CTA bilaterally, no wheezes, rhonchi, or rales Abdomen: +bs, soft, non tender, non distended, no masses, no  hepatomegaly, no splenomegaly Musculoskeletal: nontender, no swelling, no obvious deformity. Patient is able to ambulate well.  Gait is not  antalgic.Right hip: positives: pain with movement of hip and pain with rotation and negatives: FROM no pain with heel impact pulses full without SI pain.Good distal sensations and pulses bilaterally. Extremities: no edema, no cyanosis, no clubbing Pulses: 2+ symmetric, upper and lower extremities, normal cap refill Neurological: alert, oriented x 3, CN2-12 intact, strength normal upper extremities and lower extremities, sensation normal throughout, DTRs 2+ throughout, no cerebellar signs, gait normal Psychiatric: normal affect, behavior normal, pleasant   Izora Ribas, NP 9:48 AM [facility]

## 2020-07-14 ENCOUNTER — Ambulatory Visit (INDEPENDENT_AMBULATORY_CARE_PROVIDER_SITE_OTHER): Payer: Medicare HMO | Admitting: Adult Health

## 2020-07-14 ENCOUNTER — Other Ambulatory Visit: Payer: Self-pay

## 2020-07-14 ENCOUNTER — Encounter: Payer: Self-pay | Admitting: Adult Health

## 2020-07-14 VITALS — BP 128/78 | HR 71 | Temp 97.5°F | Ht 68.5 in | Wt 146.0 lb

## 2020-07-14 DIAGNOSIS — R6889 Other general symptoms and signs: Secondary | ICD-10-CM | POA: Diagnosis not present

## 2020-07-14 DIAGNOSIS — Z6822 Body mass index (BMI) 22.0-22.9, adult: Secondary | ICD-10-CM

## 2020-07-14 DIAGNOSIS — Z6821 Body mass index (BMI) 21.0-21.9, adult: Secondary | ICD-10-CM | POA: Diagnosis not present

## 2020-07-14 DIAGNOSIS — Z860100 Personal history of colon polyps, unspecified: Secondary | ICD-10-CM

## 2020-07-14 DIAGNOSIS — E782 Mixed hyperlipidemia: Secondary | ICD-10-CM | POA: Diagnosis not present

## 2020-07-14 DIAGNOSIS — Z8639 Personal history of other endocrine, nutritional and metabolic disease: Secondary | ICD-10-CM | POA: Diagnosis not present

## 2020-07-14 DIAGNOSIS — D509 Iron deficiency anemia, unspecified: Secondary | ICD-10-CM

## 2020-07-14 DIAGNOSIS — M85852 Other specified disorders of bone density and structure, left thigh: Secondary | ICD-10-CM

## 2020-07-14 DIAGNOSIS — N814 Uterovaginal prolapse, unspecified: Secondary | ICD-10-CM

## 2020-07-14 DIAGNOSIS — R7309 Other abnormal glucose: Secondary | ICD-10-CM | POA: Diagnosis not present

## 2020-07-14 DIAGNOSIS — E559 Vitamin D deficiency, unspecified: Secondary | ICD-10-CM

## 2020-07-14 DIAGNOSIS — Z79899 Other long term (current) drug therapy: Secondary | ICD-10-CM

## 2020-07-14 DIAGNOSIS — I10 Essential (primary) hypertension: Secondary | ICD-10-CM

## 2020-07-14 DIAGNOSIS — J302 Other seasonal allergic rhinitis: Secondary | ICD-10-CM

## 2020-07-14 DIAGNOSIS — Z0001 Encounter for general adult medical examination with abnormal findings: Secondary | ICD-10-CM

## 2020-07-14 DIAGNOSIS — Z0000 Encounter for general adult medical examination without abnormal findings: Secondary | ICD-10-CM

## 2020-07-14 LAB — LIPID PANEL
Cholesterol: 199 mg/dL (ref <200)
HDL: 65 mg/dL (ref >=50)
LDL Cholesterol (Calc): 109 mg/dL (calc) — ABNORMAL HIGH
Non-HDL Cholesterol (Calc): 134 mg/dL (calc) — ABNORMAL HIGH (ref <130)
Total CHOL/HDL Ratio: 3.1 (calc) (ref <5.0)
Triglycerides: 141 mg/dL (ref <150)

## 2020-07-14 LAB — CBC WITH DIFFERENTIAL/PLATELET
Absolute Monocytes: 325 cells/uL (ref 200–950)
Basophils Absolute: 50 cells/uL (ref 0–200)
Basophils Relative: 0.9 %
Eosinophils Absolute: 61 cells/uL (ref 15–500)
Eosinophils Relative: 1.1 %
HCT: 38 % (ref 35.0–45.0)
Hemoglobin: 12.6 g/dL (ref 11.7–15.5)
Lymphs Abs: 1474 cells/uL (ref 850–3900)
MCH: 30.7 pg (ref 27.0–33.0)
MCHC: 33.2 g/dL (ref 32.0–36.0)
MCV: 92.7 fL (ref 80.0–100.0)
MPV: 10.6 fL (ref 7.5–12.5)
Monocytes Relative: 5.9 %
Neutro Abs: 3592 cells/uL (ref 1500–7800)
Neutrophils Relative %: 65.3 %
Platelets: 296 10*3/uL (ref 140–400)
RBC: 4.1 10*6/uL (ref 3.80–5.10)
RDW: 12.3 % (ref 11.0–15.0)
Total Lymphocyte: 26.8 %
WBC: 5.5 10*3/uL (ref 3.8–10.8)

## 2020-07-14 LAB — COMPLETE METABOLIC PANEL WITH GFR
AG Ratio: 1.9 (calc) (ref 1.0–2.5)
ALT: 15 U/L (ref 6–29)
AST: 17 U/L (ref 10–35)
Albumin: 4.6 g/dL (ref 3.6–5.1)
Alkaline phosphatase (APISO): 54 U/L (ref 37–153)
BUN: 19 mg/dL (ref 7–25)
CO2: 29 mmol/L (ref 20–32)
Calcium: 10 mg/dL (ref 8.6–10.4)
Chloride: 103 mmol/L (ref 98–110)
Creat: 0.57 mg/dL (ref 0.50–0.99)
GFR, Est African American: 110 mL/min/{1.73_m2} (ref >=60)
GFR, Est Non African American: 95 mL/min/{1.73_m2} (ref >=60)
Globulin: 2.4 g/dL (calc) (ref 1.9–3.7)
Glucose, Bld: 98 mg/dL (ref 65–99)
Potassium: 4.9 mmol/L (ref 3.5–5.3)
Sodium: 140 mmol/L (ref 135–146)
Total Bilirubin: 0.6 mg/dL (ref 0.2–1.2)
Total Protein: 7 g/dL (ref 6.1–8.1)

## 2020-07-14 LAB — TSH: TSH: 3.03 mIU/L (ref 0.40–4.50)

## 2020-07-14 NOTE — Patient Instructions (Addendum)
    Please ask insurance if they cover shingrix   Can get at pharmacy, 2-6 months apart  "Flu like symptoms" are common    Zoster Vaccine, Recombinant injection What is this medication? ZOSTER VACCINE (ZOS ter vak SEEN) is a vaccine used to reduce the risk of getting shingles. This vaccine is not used to treat shingles or nerve pain fromshingles. This medicine may be used for other purposes; ask your health care provider orpharmacist if you have questions. COMMON BRAND NAME(S): [facility] What should I tell my care team before I take this medication? They need to know if you have any of these conditions: cancer immune system problems an unusual or allergic reaction to Zoster vaccine, other medications, foods, dyes, or preservatives pregnant or trying to get pregnant breast-feeding How should I use this medication? This vaccine is injected into a muscle. It is given by a health care provider. A copy of Vaccine Information Statements will be given before each vaccination. Be sure to read this information carefully each time. This sheet may changeoften. Talk to your health care provider about the use of this vaccine in children.This vaccine is not approved for use in children. Overdosage: If you think you have taken too much of this medicine contact apoison control center or emergency room at once. NOTE: This medicine is only for you. Do not share this medicine with others. What if I miss a dose? Keep appointments for follow-up (booster) doses. It is important not to miss your dose. Call your health care provider if you are unable to keep anappointment. What may interact with this medication? medicines that suppress your immune system medicines to treat cancer steroid medicines like prednisone or cortisone This list may not describe all possible interactions. Give your health care provider a list of all the medicines, herbs, non-prescription drugs, or dietary supplements you use. Also  tell them if you smoke, drink alcohol, or use illegaldrugs. Some items may interact with your medicine. What should I watch for while using this medication? Visit your health care provider regularly. This vaccine, like all vaccines, may not fully protect everyone. What side effects may I notice from receiving this medication? Side effects that you should report to your doctor or health care professionalas soon as possible: allergic reactions (skin rash, itching or hives; swelling of the face, lips, or tongue) trouble breathing Side effects that usually do not require medical attention (report these toyour doctor or health care professional if they continue or are bothersome): chills headache fever nausea pain, redness, or irritation at site where injected tiredness vomiting This list may not describe all possible side effects. Call your doctor for medical advice about side effects. You may report side effects to FDA at1-800-FDA-1088. Where should I keep my medication? This vaccine is only given by a health care provider. It will not be stored athome. NOTE: This sheet is a summary. It may not cover all possible information. If you have questions about this medicine, talk to your doctor, pharmacist, orhealth care provider.  2022 Elsevier/Gold Standard (2019-02-09 16:23:07)

## 2020-07-16 ENCOUNTER — Ambulatory Visit: Payer: Medicare HMO | Admitting: Adult Health Nurse Practitioner

## 2020-08-19 ENCOUNTER — Other Ambulatory Visit: Payer: Self-pay | Admitting: Adult Health

## 2020-09-24 DIAGNOSIS — H10413 Chronic giant papillary conjunctivitis, bilateral: Secondary | ICD-10-CM | POA: Diagnosis not present

## 2020-09-24 DIAGNOSIS — H04123 Dry eye syndrome of bilateral lacrimal glands: Secondary | ICD-10-CM | POA: Diagnosis not present

## 2020-11-06 ENCOUNTER — Other Ambulatory Visit: Payer: Self-pay

## 2020-11-06 ENCOUNTER — Ambulatory Visit (INDEPENDENT_AMBULATORY_CARE_PROVIDER_SITE_OTHER): Payer: Medicare HMO

## 2020-11-06 VITALS — Temp 97.6°F

## 2020-11-06 DIAGNOSIS — Z23 Encounter for immunization: Secondary | ICD-10-CM | POA: Diagnosis not present

## 2021-01-13 ENCOUNTER — Other Ambulatory Visit: Payer: Self-pay | Admitting: Internal Medicine

## 2021-01-13 DIAGNOSIS — Z1231 Encounter for screening mammogram for malignant neoplasm of breast: Secondary | ICD-10-CM

## 2021-01-21 ENCOUNTER — Ambulatory Visit: Payer: Medicare HMO | Admitting: Obstetrics and Gynecology

## 2021-03-13 ENCOUNTER — Ambulatory Visit
Admission: RE | Admit: 2021-03-13 | Discharge: 2021-03-13 | Disposition: A | Discharge status: Home / Self Care | Payer: Medicare HMO | Source: Ambulatory Visit | Attending: Internal Medicine | Admitting: Internal Medicine

## 2021-03-13 DIAGNOSIS — Z1231 Encounter for screening mammogram for malignant neoplasm of breast: Secondary | ICD-10-CM

## 2021-04-07 NOTE — Progress Notes (Signed)
?COMPLETE PHYSICAL ? ?Assessment / Plan:  ? ?Diagnoses and all orders for this visit: ? ?Encounter for general adult medical examination with abnormal findings ?Yearly ? ?BMI 22.0-22.9, adult ?Discussed dietary and exercise modifications ? ?Essential hypertension ?Continue current medications: amlodipine '5mg'$   ?Monitor blood pressure at home; call if consistently over 130/80 ?Continue DASH diet.   ?Reminder to go to the ER if any CP, SOB, nausea, dizziness, severe HA, changes vision/speech, left arm numbness and tingling and jaw pain. ?-     CBC with Differential/Platelet ?-     TSH ?-     COMPLETE METABOLIC PANEL WITH GFR ?-     EKG 12-Lead ? ?Hyperlipidemia, mixed ?Continue medications: Omega3 1,'000mg'$  TID ?Discussed dietary and exercise modifications ?Low fat diet ?-     Lipid panel ? ?Iron deficiency anemia, unspecified iron deficiency anemia type ?-     Iron, Total/Total Iron Binding Cap ?- CBC ? ?Medication management ?Continued ? ?Vitamin D deficiency ?Continue supplementation to maintain goal of 70-100 ?Taking Vitamin D 5,000 IU daily ?-     VITAMIN D 25 Hydroxy (Vit-D Deficiency, Fractures) ? ?Seasonal allergies ?Doing well at this time ?OTC PRN ? ?Osteopenia Left femoral neck ?Continue Calcium and Vit D ? ?Screening, ischemic heart disease ?-     EKG 12-Lead ? ?Screening for blood or protein in urine ?-     Routine UA with reflex microscopic ?-  Microalbumin/creatinine urine ratio ? ?Abnormal glucose ?Continue diet and exercise ?-     Hemoglobin A1c ? ?B12 deficiency ?-     Vitamin B12 ? ?Screening for thyroid disorder ?- TSH ? ?Right knee Crepitus ?Continue to monitor ?Use Tiger balm and ibuprofen as needed ?If becomes painful or starts to lock notify the office and will get imaging. ? ? ?Further disposition pending results if labs check today. Discussed med's effects and SE's.   ?Over 30 minutes of face to face interview, exam, counseling, chart review, and critical decision making was performed.   ? ?Future Appointments  ?Date Time Provider [place]  ?07/14/2021  9:30 AM Liane Comber, NP [facility] None  ?12/31/2021 10:00 AM Salvadore Dom, MD [facility] None  ?04/13/2022  2:00 PM Towanda Hornstein, Townsend Roger, NP [facility] None  ? ? ? ?Subjective:  ?[name] [name] is a 71 y.o. female who presents for Medicare Annual Wellness Visit and 3 month follow up.  ? ?She is having popping in her right knee. This started several weeks.  Denies pain, locking of knee or knee giving out.  ?She has right anterior hip pain previously evaluated by [place] and did have injection and PT.  Uses Tiger Balm, Tylenol and Ibuprofen. Currently well controlled. ? ? ?Her blood pressure has been controlled at home, currently on Amlodipine 5 mg daily. Today their BP is BP: 130/82  ?BP Readings from Last 3 Encounters:  ?04/09/21 130/82  ?07/14/20 128/78  ?04/09/20 (!) 142/80  ?She does workout. She denies chest pain, shortness of breath, dizziness.  ? ?BMI is Body mass index is 22.06 kg/m?., she is working on diet and exercise. ?Wt Readings from Last 3 Encounters:  ?04/09/21 147 lb 3.2 oz (66.8 kg)  ?07/14/20 146 lb (66.2 kg)  ?04/09/20 149 lb (67.6 kg)  ? ?She is on cholesterol medication and denies myalgias. Her cholesterol is at goal. The cholesterol last visit was:   ?Lab Results  ?Component Value Date  ? CHOL 199 07/14/2020  ? HDL 65 07/14/2020  ? LDLCALC 109 (H) 07/14/2020  ?  TRIG 141 07/14/2020  ? CHOLHDL 3.1 07/14/2020  ? ? Last A1C in the office was:  ?Lab Results  ?Component Value Date  ? HGBA1C 5.6 04/09/2020  ? ?Last GFR: ?Lab Results  ?Component Value Date  ? GFRNONAA 95 07/14/2020  ? ?Patient is on Vitamin D supplement.   ?Lab Results  ?Component Value Date  ? VD25OH 68 04/09/2020  ?   ? ?Medication Review: ? ? ?Current Outpatient Medications (Cardiovascular):  ?  amLODipine (NORVASC) 5 MG tablet, Take  1 tablet  Daily  for BP / Patient knows to take by mouth ? ? ?Current Outpatient Medications (Analgesics):   ?  acetaminophen (TYLENOL) 325 MG tablet, Take 2 tablets (650 mg total) by mouth every 6 (six) hours as needed for mild pain (or Fever >/= 101). ?  aspirin EC 81 MG tablet, Take 81 mg by mouth every other day.  ? ? ?Current Outpatient Medications (Other):  ?  calcium carbonate (OS-CAL) 600 MG TABS tablet, Take 600 mg by mouth daily with breakfast. ?  Cholecalciferol (VITAMIN D3) 125 MCG (5000 UT) CAPS, Take 5,000 Units by mouth daily. ?  fish oil-omega-3 fatty acids 1000 MG capsule, Take 1 g by mouth 3 (three) times daily with meals.  ?  Flaxseed, Linseed, (FLAXSEED OIL PO), Take 1 capsule by mouth 3 (three) times daily with meals.  ?  Multiple Vitamin (MULTIVITAMIN) tablet, Take 1 tablet by mouth every morning.  ?  OVER THE COUNTER MEDICATION, Takes Allergy meds as needed ?  Ascorbic Acid (VITAMIN C) 1000 MG tablet, Take 1,000 mg by mouth daily. (Patient not taking: Reported on 04/09/2021) ?  zinc gluconate 50 MG tablet, Take 50 mg by mouth daily. (Patient not taking: Reported on 04/09/2021) ? ?Allergies ?Allergies  ?Allergen Reactions  ? Amoxil [Amoxicillin] Rash  ? Ampicillin Rash  ? ? ?Current Problems (verified) ?Patient Active Problem List  ? Diagnosis Date Noted  ? Abnormal glucose 08/20/2018  ? History of thyroiditis 03/26/2018  ? Osteopenia of left femoral neck 03/23/2018  ? Cystocele with prolapse 03/23/2018  ? BMI 21.0-21.9, adult 06/01/2017  ? Medication management 07/26/2014  ? Hyperlipidemia, mixed   ? Seasonal allergies   ? Vitamin D deficiency   ? Hypertension   ? History of colonic polyps 03/23/2011  ? ? ?Screening Tests ?Immunization History  ?Administered Date(s) Administered  ? Influenza Inj Mdck Quad With Preservative 01/03/2020  ? Influenza Split 01/03/2013  ? Influenza, High Dose Seasonal PF 10/23/2015, 11/08/2016, 11/25/2017, 10/04/2018, 11/06/2020  ? PPD Test 07/13/2013  ? Pneumococcal Conjugate-13 01/08/2016  ? Pneumococcal Polysaccharide-23 12/19/2017  ? Pneumococcal-Unspecified 01/18/2002   ? Tdap 01/19/2003, 07/05/2012  ? Zoster, Live 07/05/2012  ? ?Health Maintenance  ?Topic Date Due  ? COVID-19 Vaccine (1) Never done  ? Zoster Vaccines- Shingrix (1 of 2) Never done  ? COLONOSCOPY (Pts 45-64yr Insurance coverage will need to be confirmed)  02/15/2021  ? MAMMOGRAM  03/13/2022  ? TETANUS/TDAP  07/06/2022  ? Pneumonia Vaccine 71 Years old  Completed  ? INFLUENZA VACCINE  Completed  ? DEXA SCAN  Completed  ? Hepatitis C Screening  Completed  ? HPV VACCINES  Aged Out  ? ?Preventative care: ?Last colonoscopy: 01/2016 DUE but has not yet gotten a letter. Dr. GCarlean Purl?Last mammogram: 03/13/21 ?Last pap smear/pelvic exam: 2021 Dr JTalbert Nan?DEXA: 03/2019 osteopenia ? ?Prior vaccinations: ?COVID: declines- discussed  ?Shingrix- has not had yet- discussed ? ?Names of Other Physician/Practitioners you currently use: ?1. [facility] here for primary [facility] ?Patient Care Team: ?Unk Pinto, MD as PCP - General (Internal Medicine)  ?2. Eye Exam: 2022 ?3. Dental Exam: 2022 Q8month ? ?SURGICAL HISTORY ?She  has a past surgical history that includes Cardiac surgery (1958); Colonoscopy; Cervix lesion destruction (N/A, ? years ago); Tubal ligation; Colonoscopy (N/A, 02/16/2016); and Cryotherapy. ?FAMILY HISTORY ?Her family history includes Colon cancer (age of onset: 540 in her sister; Diabetes in her brother, father, sister, and sister. ?SOCIAL HISTORY ?She  reports that she has never smoked. She has never used smokeless tobacco. She reports current alcohol use. She reports that she does not use drugs. ? ?Review of Systems  ?Constitutional:  Negative for chills and fever.  ?HENT:  Negative for congestion, hearing loss, sinus pain, sore throat and tinnitus.   ?Eyes:  Negative for blurred vision and double vision.  ?Respiratory:  Negative for cough, hemoptysis, sputum production, shortness of breath and wheezing.   ?Cardiovascular:  Negative for chest pain, palpitations and leg  swelling.  ?Gastrointestinal:  Negative for abdominal pain, constipation, diarrhea, heartburn, nausea and vomiting.  ?Genitourinary:  Negative for dysuria and urgency.  ?Musculoskeletal:  Positive for joint pain

## 2021-04-09 ENCOUNTER — Encounter: Payer: Self-pay | Admitting: Nurse Practitioner

## 2021-04-09 ENCOUNTER — Ambulatory Visit (INDEPENDENT_AMBULATORY_CARE_PROVIDER_SITE_OTHER): Payer: Medicare HMO | Admitting: Nurse Practitioner

## 2021-04-09 ENCOUNTER — Other Ambulatory Visit: Payer: Self-pay

## 2021-04-09 ENCOUNTER — Encounter: Payer: Medicare HMO | Admitting: Adult Health Nurse Practitioner

## 2021-04-09 VITALS — BP 130/82 | HR 73 | Temp 97.5°F | Wt 147.2 lb

## 2021-04-09 DIAGNOSIS — E538 Deficiency of other specified B group vitamins: Secondary | ICD-10-CM

## 2021-04-09 DIAGNOSIS — D509 Iron deficiency anemia, unspecified: Secondary | ICD-10-CM

## 2021-04-09 DIAGNOSIS — M238X1 Other internal derangements of right knee: Secondary | ICD-10-CM

## 2021-04-09 DIAGNOSIS — R7309 Other abnormal glucose: Secondary | ICD-10-CM

## 2021-04-09 DIAGNOSIS — Z0001 Encounter for general adult medical examination with abnormal findings: Secondary | ICD-10-CM

## 2021-04-09 DIAGNOSIS — Z136 Encounter for screening for cardiovascular disorders: Secondary | ICD-10-CM

## 2021-04-09 DIAGNOSIS — E559 Vitamin D deficiency, unspecified: Secondary | ICD-10-CM | POA: Diagnosis not present

## 2021-04-09 DIAGNOSIS — E782 Mixed hyperlipidemia: Secondary | ICD-10-CM | POA: Diagnosis not present

## 2021-04-09 DIAGNOSIS — Z1389 Encounter for screening for other disorder: Secondary | ICD-10-CM | POA: Diagnosis not present

## 2021-04-09 DIAGNOSIS — J302 Other seasonal allergic rhinitis: Secondary | ICD-10-CM

## 2021-04-09 DIAGNOSIS — Z1329 Encounter for screening for other suspected endocrine disorder: Secondary | ICD-10-CM | POA: Diagnosis not present

## 2021-04-09 DIAGNOSIS — I10 Essential (primary) hypertension: Secondary | ICD-10-CM | POA: Diagnosis not present

## 2021-04-09 DIAGNOSIS — Z6822 Body mass index (BMI) 22.0-22.9, adult: Secondary | ICD-10-CM

## 2021-04-09 DIAGNOSIS — Z0000 Encounter for general adult medical examination without abnormal findings: Secondary | ICD-10-CM

## 2021-04-09 DIAGNOSIS — Z79899 Other long term (current) drug therapy: Secondary | ICD-10-CM | POA: Diagnosis not present

## 2021-04-09 DIAGNOSIS — M85852 Other specified disorders of bone density and structure, left thigh: Secondary | ICD-10-CM

## 2021-04-10 LAB — HEMOGLOBIN A1C
Hgb A1c MFr Bld: 5.6 % of total Hgb (ref <5.7)
Mean Plasma Glucose: 114 mg/dL
eAG (mmol/L): 6.3 mmol/L

## 2021-04-10 LAB — CBC WITH DIFFERENTIAL/PLATELET
Absolute Monocytes: 511 cells/uL (ref 200–950)
Basophils Absolute: 50 cells/uL (ref 0–200)
Basophils Relative: 0.7 %
Eosinophils Absolute: 50 cells/uL (ref 15–500)
Eosinophils Relative: 0.7 %
HCT: 36.6 % (ref 35.0–45.0)
Hemoglobin: 12.3 g/dL (ref 11.7–15.5)
Lymphs Abs: 1953 cells/uL (ref 850–3900)
MCH: 30.8 pg (ref 27.0–33.0)
MCHC: 33.6 g/dL (ref 32.0–36.0)
MCV: 91.5 fL (ref 80.0–100.0)
MPV: 10.7 fL (ref 7.5–12.5)
Monocytes Relative: 7.2 %
Neutro Abs: 4537 cells/uL (ref 1500–7800)
Neutrophils Relative %: 63.9 %
Platelets: 310 10*3/uL (ref 140–400)
RBC: 4 10*6/uL (ref 3.80–5.10)
RDW: 11.6 % (ref 11.0–15.0)
Total Lymphocyte: 27.5 %
WBC: 7.1 10*3/uL (ref 3.8–10.8)

## 2021-04-10 LAB — VITAMIN D 25 HYDROXY (VIT D DEFICIENCY, FRACTURES): Vit D, 25-Hydroxy: 74 ng/mL (ref 30–100)

## 2021-04-10 LAB — COMPLETE METABOLIC PANEL WITH GFR
AG Ratio: 1.9 (calc) (ref 1.0–2.5)
ALT: 16 U/L (ref 6–29)
AST: 16 U/L (ref 10–35)
Albumin: 4.6 g/dL (ref 3.6–5.1)
Alkaline phosphatase (APISO): 54 U/L (ref 37–153)
BUN/Creatinine Ratio: 29 (calc) — ABNORMAL HIGH (ref 6–22)
BUN: 17 mg/dL (ref 7–25)
CO2: 29 mmol/L (ref 20–32)
Calcium: 9.9 mg/dL (ref 8.6–10.4)
Chloride: 101 mmol/L (ref 98–110)
Creat: 0.59 mg/dL — ABNORMAL LOW (ref 0.60–1.00)
Globulin: 2.4 g/dL (calc) (ref 1.9–3.7)
Glucose, Bld: 87 mg/dL (ref 65–99)
Potassium: 4.1 mmol/L (ref 3.5–5.3)
Sodium: 138 mmol/L (ref 135–146)
Total Bilirubin: 0.4 mg/dL (ref 0.2–1.2)
Total Protein: 7 g/dL (ref 6.1–8.1)
eGFR: 97 mL/min/{1.73_m2} (ref >=60)

## 2021-04-10 LAB — URINALYSIS, ROUTINE W REFLEX MICROSCOPIC
Bilirubin Urine: NEGATIVE
Glucose, UA: NEGATIVE
Hgb urine dipstick: NEGATIVE
Ketones, ur: NEGATIVE
Leukocytes,Ua: NEGATIVE
Nitrite: NEGATIVE
Protein, ur: NEGATIVE
Specific Gravity, Urine: 1.007 (ref 1.001–1.035)
pH: 6 (ref 5.0–8.0)

## 2021-04-10 LAB — LIPID PANEL
Cholesterol: 180 mg/dL (ref <200)
HDL: 55 mg/dL (ref >=50)
LDL Cholesterol (Calc): 95 mg/dL (calc)
Non-HDL Cholesterol (Calc): 125 mg/dL (calc) (ref <130)
Total CHOL/HDL Ratio: 3.3 (calc) (ref <5.0)
Triglycerides: 210 mg/dL — ABNORMAL HIGH (ref <150)

## 2021-04-10 LAB — IRON, TOTAL/TOTAL IRON BINDING CAP
%SAT: 20 % (calc) (ref 16–45)
Iron: 68 ug/dL (ref 45–160)
TIBC: 348 mcg/dL (calc) (ref 250–450)

## 2021-04-10 LAB — MAGNESIUM: Magnesium: 1.9 mg/dL (ref 1.5–2.5)

## 2021-04-10 LAB — MICROALBUMIN / CREATININE URINE RATIO
Creatinine, Urine: 32 mg/dL (ref 20–275)
Microalb Creat Ratio: 16 mcg/mg creat (ref <30)
Microalb, Ur: 0.5 mg/dL

## 2021-04-10 LAB — TSH: TSH: 0.19 mIU/L — ABNORMAL LOW (ref 0.40–4.50)

## 2021-04-10 LAB — VITAMIN B12: Vitamin B-12: 351 pg/mL (ref 200–1100)

## 2021-05-22 DIAGNOSIS — H40023 Open angle with borderline findings, high risk, bilateral: Secondary | ICD-10-CM | POA: Diagnosis not present

## 2021-05-22 DIAGNOSIS — H02403 Unspecified ptosis of bilateral eyelids: Secondary | ICD-10-CM | POA: Diagnosis not present

## 2021-06-01 ENCOUNTER — Ambulatory Visit (AMBULATORY_SURGERY_CENTER): Payer: Medicare HMO | Admitting: *Deleted

## 2021-06-01 VITALS — Ht 68.5 in | Wt 147.0 lb

## 2021-06-01 DIAGNOSIS — Z800 Family history of malignant neoplasm of digestive organs: Secondary | ICD-10-CM

## 2021-06-01 DIAGNOSIS — Z86010 Personal history of colonic polyps: Secondary | ICD-10-CM

## 2021-06-01 NOTE — Progress Notes (Signed)
Patient's pre-visit was done today over the phone with the patient. Name,DOB and address verified. Patient denies any allergies to Eggs and Soy. Patient denies any problems with anesthesia/sedation. Patient is not taking any diet pills or blood thinners. No home Oxygen. Insurance confirmed with patient. ? ?Prep instructions sent to pt's MyChart (if available) & mailed to pt-pt is aware. Patient understands to call us back with any questions or concerns. Patient is aware of our care-partner policy.  ? ?EMMI education assigned to the patient for the procedure, sent to [facility].  ? ?

## 2021-06-19 ENCOUNTER — Encounter: Payer: Self-pay | Admitting: Internal Medicine

## 2021-06-23 ENCOUNTER — Ambulatory Visit (AMBULATORY_SURGERY_CENTER): Payer: Medicare HMO | Admitting: Internal Medicine

## 2021-06-23 ENCOUNTER — Encounter: Payer: Self-pay | Admitting: Internal Medicine

## 2021-06-23 VITALS — BP 126/75 | HR 65 | Temp 98.0°F | Resp 13 | Ht 68.5 in | Wt 147.0 lb

## 2021-06-23 DIAGNOSIS — Z86010 Personal history of colonic polyps: Secondary | ICD-10-CM | POA: Diagnosis not present

## 2021-06-23 DIAGNOSIS — Z09 Encounter for follow-up examination after completed treatment for conditions other than malignant neoplasm: Secondary | ICD-10-CM

## 2021-06-23 DIAGNOSIS — D123 Benign neoplasm of transverse colon: Secondary | ICD-10-CM

## 2021-06-23 MED ORDER — SODIUM CHLORIDE 0.9 % IV SOLN: 500.0000 mL | Freq: Once | INTRAVENOUS | Status: DC

## 2021-06-23 NOTE — Progress Notes (Signed)
Called to room to assist during endoscopic procedure.  Patient ID and intended procedure confirmed with present staff. Received instructions for my participation in the procedure from the performing physician.  

## 2021-06-23 NOTE — Progress Notes (Signed)
During the admission with the patient she admitted that she took one half of a 0.5 xanax this morning.

## 2021-06-23 NOTE — Progress Notes (Signed)
Report to pacu rn; vss. ?

## 2021-06-23 NOTE — Op Note (Signed)
[facility] Patient Name: [name] [name] Procedure Date: 06/23/2021 9:55 AM MRN: [id number] Endoscopist: Gatha Mayer , MD Age: 71 Referring MD:  Date of Birth: [birth year] Gender: Female Account #: [id number] Procedure:                Colonoscopy Indications:              Surveillance: Personal history of adenomatous                            polyps on last colonoscopy 5 years ago, Last                            colonoscopy: 2018 Medicines:                Monitored Anesthesia Care Procedure:                Pre-Anesthesia Assessment:                           - Prior to the procedure, a History and Physical                            was performed, and patient medications and                            allergies were reviewed. The patient's tolerance of                            previous anesthesia was also reviewed. The risks                            and benefits of the procedure and the sedation                            options and risks were discussed with the patient.                            All questions were answered, and informed consent                            was obtained. Prior Anticoagulants: The patient has                            taken no previous anticoagulant or antiplatelet                            agents. ASA Grade Assessment: II - A patient with                            mild systemic disease. After reviewing the risks                            and benefits, the patient was deemed in  satisfactory condition to undergo the procedure.                           After obtaining informed consent, the colonoscope                            was passed under direct vision. Throughout the                            procedure, the patient's blood pressure, pulse, and                            oxygen saturations were monitored continuously. The                            Olympus PCF-H190DL (#[id number]) Colonoscope was                             introduced through the anus and advanced to the the                            cecum, identified by appendiceal orifice and                            ileocecal valve. The colonoscopy was performed                            without difficulty. The patient tolerated the                            procedure well. The quality of the bowel                            preparation was good. The ileocecal valve,                            appendiceal orifice, and rectum were photographed.                            The bowel preparation used was Miralax via split                            dose instruction. Scope In: 10:04:27 AM Scope Out: 10:22:47 AM Scope Withdrawal Time: 0 hours 11 minutes 8 seconds  Total Procedure Duration: 0 hours 18 minutes 20 seconds  Findings:                 The perianal and digital rectal examinations were                            normal.                           A 2 mm polyp was found in the transverse colon. The  polyp was sessile. The polyp was removed with a                            cold snare. Resection and retrieval were complete.                            Verification of patient identification for the                            specimen was done. Estimated blood loss was minimal.                           Multiple diverticula were found in the sigmoid                            colon.                           The exam was otherwise without abnormality on                            direct and retroflexion views. Complications:            No immediate complications. Estimated Blood Loss:     Estimated blood loss was minimal. Impression:               - One 2 mm polyp in the transverse colon, removed                            with a cold snare. Resected and retrieved.                           - Diverticulosis in the sigmoid colon.                           - The examination was otherwise normal on direct                             and retroflexion views.                           - Personal history of colonic polyps. sister had                            CRCA at 56 also                           diminutive adenoma removed from right colon                           2004 and 7 mm right colon hyperplastic polyp                            removed 2007  Small serrated adenoma and a tubular adenoma 03/2011                            01/2016 12 mm IC valve polyp - inflammatory -                            post-polypectomy bleed Recommendation:           - Patient has a contact number available for                            emergencies. The signs and symptoms of potential                            delayed complications were discussed with the                            patient. Return to normal activities tomorrow.                            Written discharge instructions were provided to the                            patient.                           - Resume previous diet.                           - Continue present medications.                           - Await pathology results.                           - Repeat colonoscopy in 5 years for surveillance. Gatha Mayer, MD 06/23/2021 10:35:21 AM This report has been signed electronically.

## 2021-06-23 NOTE — Progress Notes (Signed)
[facility] History and Physical   Primary Care Physician:  Unk Pinto, MD   Reason for Procedure:   Hx polyps  Plan:    colonoscopy     HPI: [name] [name] is a 71 y.o. female w/ hx colon polyps and FHx CRCA  diminutive adenoma removed from right colon  2004 and 7 mm right colon hyperplastic polyp removed 2007 Small serrated adenoma and a tubular adenoma 03/2011 - anticipate routine repeat approx 03/2014 (sister had CRCA at 83 also) 01/2016 12 mm IC valve polyp - inflammatory - post-polypectomy bleed    Past Medical History:  Diagnosis Date   Abnormal Pap smear of cervix    Acute lower GI bleeding 02/16/2016   Blood transfusion without reported diagnosis 2018   Heart murmur    Hyperlipidemia    Hypertension    Prediabetes    Seasonal allergies    Thyroiditis 07/22/2017   Vitamin D deficiency     Past Surgical History:  Procedure Laterality Date   [place]   at age 25 for hole in heart   CERVIX LESION DESTRUCTION N/A ? years ago   with abnormal pap   COLONOSCOPY     polyps   COLONOSCOPY N/A 02/16/2016   Procedure: COLONOSCOPY;  Surgeon: Ladene Artist, MD;  Location: [facility];  Service: Endoscopy;  Laterality: N/A;   CRYOTHERAPY     for abnormal pap smear   TUBAL LIGATION      Prior to Admission medications   Medication Sig Start Date End Date Taking? Authorizing Provider  acetaminophen (TYLENOL) 325 MG tablet Take 2 tablets (650 mg total) by mouth every 6 (six) hours as needed for mild pain (or Fever >/= 101). 02/18/16  Yes Regalado, Belkys A, MD  amLODipine (NORVASC) 5 MG tablet Take  1 tablet  Daily  for BP / Patient knows to take by mouth 08/19/20  Yes Unk Pinto, MD  aspirin EC 81 MG tablet Take 81 mg by mouth every other day.    Yes [provider]  Cholecalciferol (VITAMIN D3) 125 MCG (5000 UT) CAPS Take 5,000 Units by mouth daily.   Yes [provider]  fish oil-omega-3 fatty acids 1000 MG capsule  Take 1 g by mouth 3 (three) times daily with meals.    Yes [provider]  Flaxseed, Linseed, (FLAXSEED OIL PO) Take 1 capsule by mouth 3 (three) times daily with meals.    Yes [provider]  Multiple Vitamins-Minerals (CENTRUM SILVER PO) Take by mouth.   Yes [provider]  OVER THE COUNTER MEDICATION Takes Allergy meds as needed   Yes [provider]  calcium carbonate (OS-CAL) 600 MG TABS tablet Take 600 mg by mouth daily with breakfast. Patient not taking: Reported on 06/23/2021    [provider]  ibuprofen (ADVIL) 400 MG tablet Take 400 mg by mouth 2 (two) times daily.    [provider]    Current Outpatient Medications  Medication Sig Dispense Refill   acetaminophen (TYLENOL) 325 MG tablet Take 2 tablets (650 mg total) by mouth every 6 (six) hours as needed for mild pain (or Fever >/= 101). 30 tablet 0   amLODipine (NORVASC) 5 MG tablet Take  1 tablet  Daily  for BP / Patient knows to take by mouth 90 tablet 3   aspirin EC 81 MG tablet Take 81 mg by mouth every other day.      Cholecalciferol (VITAMIN D3) 125 MCG (5000 UT) CAPS  Take 5,000 Units by mouth daily.     fish oil-omega-3 fatty acids 1000 MG capsule Take 1 g by mouth 3 (three) times daily with meals.      Flaxseed, Linseed, (FLAXSEED OIL PO) Take 1 capsule by mouth 3 (three) times daily with meals.      Multiple Vitamins-Minerals (CENTRUM SILVER PO) Take by mouth.     OVER THE COUNTER MEDICATION Takes Allergy meds as needed     calcium carbonate (OS-CAL) 600 MG TABS tablet Take 600 mg by mouth daily with breakfast. (Patient not taking: Reported on 06/23/2021)     ibuprofen (ADVIL) 400 MG tablet Take 400 mg by mouth 2 (two) times daily.     Current Facility-Administered Medications  Medication Dose Route Frequency Provider Last Rate Last Admin   0.9 %  sodium chloride infusion  500 mL Intravenous Once Gatha Mayer, MD        Allergies as of 06/23/2021 - Review  Complete 06/23/2021  Allergen Reaction Noted   Amoxil [amoxicillin] Rash 01/02/2013   Ampicillin Rash 03/09/2011    Family History  Problem Relation Age of Onset   Diabetes Father    Colon cancer Sister 70   Diabetes Sister    Diabetes Sister    Diabetes Brother    Stomach cancer Neg Hx    Breast cancer Neg Hx    Esophageal cancer Neg Hx    Rectal cancer Neg Hx     Social History   Socioeconomic History   Marital status: Married    Spouse name: Not on file   Number of children: Not on file   Years of education: Not on file   Highest education level: Not on file  Occupational History   Not on file  Tobacco Use   Smoking status: Never   Smokeless tobacco: Never  Vaping Use   Vaping Use: Never used  Substance and Sexual Activity   Alcohol use: Yes    Alcohol/week: 7.0 standard drinks    Types: 7 Glasses of wine per week   Drug use: No   Sexual activity: Yes    Partners: Male    Birth control/protection: Surgical    Comment: BTL  Other Topics Concern   Not on file  Social History Narrative   Not on file   Social Determinants of Health   Financial Resource Strain: Not on file  Food Insecurity: Not on file  Transportation Needs: Not on file  Physical Activity: Not on file  Stress: Not on file  Social Connections: Not on file  Intimate Partner Violence: Not on file    Review of Systems:  All other review of systems negative except as mentioned in the HPI.  Physical Exam: Vital signs BP (!) 147/77   Pulse 67   Temp 98 F (36.7 C)   Resp (!) 0   Ht 5' 8.5" (1.74 m)   Wt 147 lb (66.7 kg)   LMP 11/17/2002   SpO2 100%   BMI 22.03 kg/m   General:   Alert,  Well-developed, well-nourished, pleasant and cooperative in NAD Lungs:  Clear throughout to auscultation.   Heart:  Regular rate and rhythm; no murmurs, clicks, rubs,  or gallops. Abdomen:  Soft, nontender and nondistended. Normal bowel sounds.   Neuro/Psych:  Alert and cooperative. Normal mood  and affect. A and O x 3   '@Sachin Ferencz'$  Simonne Maffucci, MD, [facility] [phone] (pager) 06/23/2021 9:53 AM@

## 2021-06-23 NOTE — Progress Notes (Signed)
Pt's states no medical or surgical changes since previsit or office visit. 

## 2021-06-23 NOTE — Progress Notes (Signed)
No problems noted in the recovery room. maw 

## 2021-06-23 NOTE — Patient Instructions (Addendum)
One tiny polyp removed. You will be able to wait 5 years to repeat.  I appreciate the opportunity to care for you. Gatha Mayer, MD, [facility]    Handouts were given to your care partner on polyps and diverticulosis. You may resume your current medications today. Await biopsy results.  May take 1-3 weeks to receive pathology results. Please call if any questions or concerns.     YOU HAD AN ENDOSCOPIC PROCEDURE TODAY AT [facility]:   Refer to the procedure report that was given to you for any specific questions about what was found during the examination.  If the procedure report does not answer your questions, please call your gastroenterologist to clarify.  If you requested that your care partner not be given the details of your procedure findings, then the procedure report has been included in a sealed envelope for you to review at your convenience later.  YOU SHOULD EXPECT: Some feelings of bloating in the abdomen. Passage of more gas than usual.  Walking can help get rid of the air that was put into your GI tract during the procedure and reduce the bloating. If you had a lower endoscopy (such as a colonoscopy or flexible sigmoidoscopy) you may notice spotting of blood in your stool or on the toilet paper. If you underwent a bowel prep for your procedure, you may not have a normal bowel movement for a few days.  Please Note:  You might notice some irritation and congestion in your nose or some drainage.  This is from the oxygen used during your procedure.  There is no need for concern and it should clear up in a day or so.  SYMPTOMS TO REPORT IMMEDIATELY:  Following lower endoscopy (colonoscopy or flexible sigmoidoscopy):  Excessive amounts of blood in the stool  Significant tenderness or worsening of abdominal pains  Swelling of the abdomen that is new, acute  Fever of 100F or higher   For urgent or emergent issues, a gastroenterologist can be reached at any hour by  calling [phone]. Do not use MyChart messaging for urgent concerns.    DIET:  We do recommend a small meal at first, but then you may proceed to your regular diet.  Drink plenty of fluids but you should avoid alcoholic beverages for 24 hours.  ACTIVITY:  You should plan to take it easy for the rest of today and you should NOT DRIVE or use heavy machinery until tomorrow (because of the sedation medicines used during the test).    FOLLOW UP: Our staff will call the number listed on your records 24-72 hours following your procedure to check on you and address any questions or concerns that you may have regarding the information given to you following your procedure. If we do not reach you, we will leave a message.  We will attempt to reach you two times.  During this call, we will ask if you have developed any symptoms of COVID 19. If you develop any symptoms (ie: fever, flu-like symptoms, shortness of breath, cough etc.) before then, please call [phone].  If you test positive for Covid 19 in the 2 weeks post procedure, please call and report this information to [place].    If any biopsies were taken you will be contacted by phone or by letter within the next 1-3 weeks.  Please call us at [phone] if you have not heard about the biopsies in 3 weeks.    SIGNATURES/CONFIDENTIALITY: You and/or your  care partner have signed paperwork which will be entered into your electronic medical record.  These signatures attest to the fact that that the information above on your After Visit Summary has been reviewed and is understood.  Full responsibility of the confidentiality of this discharge information lies with you and/or your care-partner.

## 2021-06-24 ENCOUNTER — Telehealth: Payer: Self-pay | Admitting: *Deleted

## 2021-06-24 NOTE — Telephone Encounter (Signed)
  Follow up Call-     06/23/2021    9:13 AM  Call back number  Post procedure Call Back phone  # [phone]  Permission to leave phone message Yes     Patient questions:  Do you have a fever, pain , or abdominal swelling? No. Pain Score  0 *  Have you tolerated food without any problems? Yes.    Have you been able to return to your normal activities? Yes.    Do you have any questions about your discharge instructions: Diet   No. Medications  No. Follow up visit  No.  Do you have questions or concerns about your Care? No.  Actions: * If pain score is 4 or above: No action needed, pain <4.

## 2021-07-01 ENCOUNTER — Encounter: Payer: Self-pay | Admitting: Internal Medicine

## 2021-07-14 ENCOUNTER — Ambulatory Visit (INDEPENDENT_AMBULATORY_CARE_PROVIDER_SITE_OTHER): Payer: Medicare HMO | Admitting: Adult Health

## 2021-07-14 ENCOUNTER — Encounter: Payer: Self-pay | Admitting: Adult Health

## 2021-07-14 VITALS — BP 138/70 | HR 96 | Temp 97.7°F | Wt 146.8 lb

## 2021-07-14 DIAGNOSIS — R7309 Other abnormal glucose: Secondary | ICD-10-CM

## 2021-07-14 DIAGNOSIS — Z860100 Personal history of colon polyps, unspecified: Secondary | ICD-10-CM

## 2021-07-14 DIAGNOSIS — R6889 Other general symptoms and signs: Secondary | ICD-10-CM

## 2021-07-14 DIAGNOSIS — J302 Other seasonal allergic rhinitis: Secondary | ICD-10-CM

## 2021-07-14 DIAGNOSIS — E782 Mixed hyperlipidemia: Secondary | ICD-10-CM | POA: Diagnosis not present

## 2021-07-14 DIAGNOSIS — I10 Essential (primary) hypertension: Secondary | ICD-10-CM | POA: Diagnosis not present

## 2021-07-14 DIAGNOSIS — Z0001 Encounter for general adult medical examination with abnormal findings: Secondary | ICD-10-CM | POA: Diagnosis not present

## 2021-07-14 DIAGNOSIS — Z79899 Other long term (current) drug therapy: Secondary | ICD-10-CM

## 2021-07-14 DIAGNOSIS — E559 Vitamin D deficiency, unspecified: Secondary | ICD-10-CM

## 2021-07-14 DIAGNOSIS — Z6821 Body mass index (BMI) 21.0-21.9, adult: Secondary | ICD-10-CM

## 2021-07-14 DIAGNOSIS — N814 Uterovaginal prolapse, unspecified: Secondary | ICD-10-CM | POA: Diagnosis not present

## 2021-07-14 DIAGNOSIS — M85852 Other specified disorders of bone density and structure, left thigh: Secondary | ICD-10-CM

## 2021-07-14 DIAGNOSIS — Z8639 Personal history of other endocrine, nutritional and metabolic disease: Secondary | ICD-10-CM

## 2021-07-14 DIAGNOSIS — Z0000 Encounter for general adult medical examination without abnormal findings: Secondary | ICD-10-CM

## 2021-07-14 DIAGNOSIS — Z6822 Body mass index (BMI) 22.0-22.9, adult: Secondary | ICD-10-CM

## 2021-07-15 LAB — TSH: TSH: 3.84 mIU/L (ref 0.40–4.50)

## 2021-07-15 LAB — COMPLETE METABOLIC PANEL WITH GFR
AG Ratio: 1.9 (calc) (ref 1.0–2.5)
ALT: 17 U/L (ref 6–29)
AST: 19 U/L (ref 10–35)
Albumin: 4.7 g/dL (ref 3.6–5.1)
Alkaline phosphatase (APISO): 60 U/L (ref 37–153)
BUN: 17 mg/dL (ref 7–25)
CO2: 28 mmol/L (ref 20–32)
Calcium: 10 mg/dL (ref 8.6–10.4)
Chloride: 104 mmol/L (ref 98–110)
Creat: 0.63 mg/dL (ref 0.60–1.00)
Globulin: 2.5 g/dL (calc) (ref 1.9–3.7)
Glucose, Bld: 104 mg/dL — ABNORMAL HIGH (ref 65–99)
Potassium: 4.6 mmol/L (ref 3.5–5.3)
Sodium: 141 mmol/L (ref 135–146)
Total Bilirubin: 0.4 mg/dL (ref 0.2–1.2)
Total Protein: 7.2 g/dL (ref 6.1–8.1)
eGFR: 95 mL/min/{1.73_m2} (ref >=60)

## 2021-07-15 LAB — CBC WITH DIFFERENTIAL/PLATELET
Absolute Monocytes: 325 cells/uL (ref 200–950)
Basophils Absolute: 50 cells/uL (ref 0–200)
Basophils Relative: 1 %
Eosinophils Absolute: 90 cells/uL (ref 15–500)
Eosinophils Relative: 1.8 %
HCT: 37.9 % (ref 35.0–45.0)
Hemoglobin: 12.8 g/dL (ref 11.7–15.5)
Lymphs Abs: 1400 cells/uL (ref 850–3900)
MCH: 31.8 pg (ref 27.0–33.0)
MCHC: 33.8 g/dL (ref 32.0–36.0)
MCV: 94 fL (ref 80.0–100.0)
MPV: 10.9 fL (ref 7.5–12.5)
Monocytes Relative: 6.5 %
Neutro Abs: 3135 cells/uL (ref 1500–7800)
Neutrophils Relative %: 62.7 %
Platelets: 294 10*3/uL (ref 140–400)
RBC: 4.03 10*6/uL (ref 3.80–5.10)
RDW: 12.1 % (ref 11.0–15.0)
Total Lymphocyte: 28 %
WBC: 5 10*3/uL (ref 3.8–10.8)

## 2021-07-15 LAB — LIPID PANEL
Cholesterol: 197 mg/dL (ref <200)
HDL: 62 mg/dL (ref >=50)
LDL Cholesterol (Calc): 103 mg/dL (calc) — ABNORMAL HIGH
Non-HDL Cholesterol (Calc): 135 mg/dL (calc) — ABNORMAL HIGH (ref <130)
Total CHOL/HDL Ratio: 3.2 (calc) (ref <5.0)
Triglycerides: 206 mg/dL — ABNORMAL HIGH (ref <150)

## 2021-08-28 ENCOUNTER — Other Ambulatory Visit: Payer: Self-pay | Admitting: Internal Medicine

## 2021-10-29 ENCOUNTER — Other Ambulatory Visit: Payer: Self-pay | Admitting: Internal Medicine

## 2021-10-29 ENCOUNTER — Encounter: Payer: Self-pay | Admitting: Internal Medicine

## 2021-10-29 ENCOUNTER — Ambulatory Visit (INDEPENDENT_AMBULATORY_CARE_PROVIDER_SITE_OTHER): Payer: Medicare HMO

## 2021-10-29 VITALS — Temp 98.0°F

## 2021-10-29 DIAGNOSIS — Z23 Encounter for immunization: Secondary | ICD-10-CM | POA: Diagnosis not present

## 2021-10-29 MED ORDER — MELOXICAM 15 MG PO TABS: ORAL_TABLET | ORAL | 3 refills | Status: DC

## 2021-11-09 NOTE — Progress Notes (Unsigned)
Assessment and Plan: [name] [name] was seen today for knee pain.  Diagnoses and all orders for this visit:  Essential hypertension - continue medications, DASH diet, exercise and monitor at home. Call if greater than 130/80.   Chronic pain of right knee Use Ibuprofen 800 mg once a day and flexeril as needed If x rays show any damage will refer to orthopedics -     ibuprofen (ADVIL) 800 MG tablet; Take 1 tablet (800 mg total) by mouth every 8 (eight) hours as needed. -     cyclobenzaprine (FLEXERIL) 5 MG tablet; Take 1 tablet (5 mg total) by mouth 3 (three) times daily as needed for muscle spasms. -     DG Knee Complete 4 Views Right; Future  Right hip pain Continue to follow with orthopedics -     DG HIP UNILAT W OR W/O PELVIS 2-3 VIEWS RIGHT; Future       Further disposition pending results of labs. Discussed med's effects and SE's.   Over 30 minutes of exam, counseling, chart review, and critical decision making was performed.   Future Appointments  Date Time Provider [place]  12/31/2021 10:00 AM Salvadore Dom, MD [facility] None  04/13/2022  2:00 PM Alycia Rossetti, NP [facility] None  07/15/2022  9:00 AM Darrol Jump, NP [facility] None    ------------------------------------------------------------------------------------------------------------------   HPI BP 130/84   Pulse 81   Temp 97.7 F (36.5 C)   Ht 5' 8.5" (1.74 m)   Wt 144 lb 12.8 oz (65.7 kg)   LMP 11/17/2002   SpO2 97%   BMI 21.70 kg/m   71 y.o.female presents for right knee pain that has been present for approximately 1 year.  She has a history of right hip arthritis and follow with Dr. Mayer Camel. She does take Meloxicam 15 mg 1/2-1 tab as needed. She has tried some of her husbands 5 mg Flexeril. She has not had any relief with     She is currently on Amlodipine 5 mg and BP is well controlled.  Denies headaches, chest pain, shortness of breath and dizziness BP Readings from Last 3  Encounters:  11/11/21 130/84  07/14/21 138/70  06/23/21 126/75    Past Medical History:  Diagnosis Date   Abnormal Pap smear of cervix    Acute lower GI bleeding 02/16/2016   Blood transfusion without reported diagnosis 2018   Heart murmur    Hyperlipidemia    Hypertension    Prediabetes    Seasonal allergies    Thyroiditis 07/22/2017   Vitamin D deficiency      Allergies  Allergen Reactions   Amoxil [Amoxicillin] Rash   Ampicillin Rash    Current Outpatient Medications on File Prior to Visit  Medication Sig   acetaminophen (TYLENOL) 325 MG tablet Take 2 tablets (650 mg total) by mouth every 6 (six) hours as needed for mild pain (or Fever >/= 101).   amLODipine (NORVASC) 5 MG tablet Take  1 tablet  Daily for BP                               /                                 Take  by                        mouth                     once daily   aspirin EC 81 MG tablet Take 81 mg by mouth every other day.    calcium carbonate (OS-CAL) 600 MG TABS tablet Take 600 mg by mouth daily with breakfast.   Cholecalciferol (VITAMIN D3) 125 MCG (5000 UT) CAPS Take 5,000 Units by mouth daily.   fish oil-omega-3 fatty acids 1000 MG capsule Take 1 g by mouth 3 (three) times daily with meals.    Flaxseed, Linseed, (FLAXSEED OIL PO) Take 1 capsule by mouth 3 (three) times daily with meals.    meloxicam (MOBIC) 15 MG tablet Take  1/2 to 1 tablet  Daily  with Food  for Pain & Inflammation   Multiple Vitamins-Minerals (CENTRUM SILVER PO) Take by mouth.   OVER THE COUNTER MEDICATION Takes Allergy meds as needed   No current facility-administered medications on file prior to visit.    ROS: all negative except above.   Physical Exam:  BP 130/84   Pulse 81   Temp 97.7 F (36.5 C)   Ht 5' 8.5" (1.74 m)   Wt 144 lb 12.8 oz (65.7 kg)   LMP 11/17/2002   SpO2 97%   BMI 21.70 kg/m   General Appearance: Well nourished, in no apparent distress. Eyes: PERRLA, EOMs,  conjunctiva no swelling or erythema Sinuses: No Frontal/maxillary tenderness ENT/Mouth: Ext aud canals clear, TMs without erythema, bulging. No erythema, swelling, or exudate on post pharynx.  Tonsils not swollen or erythematous. Hearing normal.  Neck: Supple, thyroid normal.  Respiratory: Respiratory effort normal, BS equal bilaterally without rales, rhonchi, wheezing or stridor.  Cardio: RRR with no MRGs. Brisk peripheral pulses without edema.  Abdomen: Soft, + BS.  Non tender, no guarding, rebound, hernias, masses. Lymphatics: Non tender without lymphadenopathy.  Musculoskeletal: Full ROM, 5/5 strength, loud popping of right knee with extension of leg. Unable to reproduce pain on exam Skin: Warm, dry without rashes, lesions, ecchymosis.  Neuro: Cranial nerves intact. Normal muscle tone, no cerebellar symptoms. Sensation intact.  Psych: Awake and oriented X 3, normal affect, Insight and Judgment appropriate.     Alycia Rossetti, NP 11:37 AM [facility]

## 2021-11-11 ENCOUNTER — Ambulatory Visit (INDEPENDENT_AMBULATORY_CARE_PROVIDER_SITE_OTHER): Payer: Medicare HMO | Admitting: Nurse Practitioner

## 2021-11-11 ENCOUNTER — Encounter: Payer: Self-pay | Admitting: Nurse Practitioner

## 2021-11-11 VITALS — BP 130/84 | HR 81 | Temp 97.7°F | Ht 68.5 in | Wt 144.8 lb

## 2021-11-11 DIAGNOSIS — M25561 Pain in right knee: Secondary | ICD-10-CM

## 2021-11-11 DIAGNOSIS — M25551 Pain in right hip: Secondary | ICD-10-CM

## 2021-11-11 DIAGNOSIS — G8929 Other chronic pain: Secondary | ICD-10-CM | POA: Diagnosis not present

## 2021-11-11 DIAGNOSIS — I10 Essential (primary) hypertension: Secondary | ICD-10-CM

## 2021-11-11 MED ORDER — IBUPROFEN 800 MG PO TABS: 800.0000 mg | ORAL_TABLET | Freq: Three times a day (TID) | ORAL | 0 refills | Status: DC | PRN

## 2021-11-11 MED ORDER — CYCLOBENZAPRINE HCL 5 MG PO TABS: 5.0000 mg | ORAL_TABLET | Freq: Three times a day (TID) | ORAL | 0 refills | Status: DC | PRN

## 2021-11-11 NOTE — Patient Instructions (Signed)
Xray of right knee and hip has been ordered  Can walk in to [facility] M-F 8:30- 3:45    Acute Knee Pain, Adult Many things can cause knee pain. Sometimes, knee pain is sudden (acute) and may be caused by damage, swelling, or irritation of the muscles and tissues that support your knee. The pain often goes away on its own with time and rest. If the pain does not go away, tests may be done to find out what is causing the pain. Follow these instructions at home: If you have a knee sleeve or brace:  Wear the knee sleeve or brace as told by your doctor. Take it off only as told by your doctor. Loosen it if your toes: Tingle. Become numb. Turn cold and blue. Keep it clean. If the knee sleeve or brace is not waterproof: Do not let it get wet. Cover it with a watertight covering when you take a bath or shower. Activity Rest your knee. Do not do things that cause pain or make pain worse. Avoid activities where both feet leave the ground at the same time (high-impact activities). Examples are running, jumping rope, and doing jumping jacks. Work with a physical therapist to make a safe exercise program, as told by your doctor. Managing pain, stiffness, and swelling  If told, put ice on the knee. To do this: If you have a removable knee sleeve or brace, take it off as told by your doctor. Put ice in a plastic bag. Place a towel between your skin and the bag. Leave the ice on for 20 minutes, 2-3 times a day. Take off the ice if your skin turns bright red. This is very important. If you cannot feel pain, heat, or cold, you have a greater risk of damage to the area. If told, use an elastic bandage to put pressure (compression) on your injured knee. Raise your knee above the level of your heart while you are sitting or lying down. Sleep with a pillow under your knee. General instructions Take over-the-counter and prescription medicines only as told by your doctor. Do not smoke or  use any products that contain nicotine or tobacco. If you need help quitting, ask your doctor. If you are overweight, work with your doctor and a food expert (dietitian) to set goals to lose weight. Being overweight can make your knee hurt more. Watch for any changes in your symptoms. Keep all follow-up visits. Contact a doctor if: The knee pain does not stop. The knee pain changes or gets worse. You have a fever along with knee pain. Your knee is red or feels warm when you touch it. Your knee gives out or locks up. Get help right away if: Your knee swells, and the swelling gets worse. You cannot move your knee. You have very bad knee pain that does not get better with pain medicine. Summary Many things can cause knee pain. The pain often goes away on its own with time and rest. Your doctor may do tests to find out the cause of the pain. Watch for any changes in your symptoms. Relieve your pain with rest, medicines, light activity, and use of ice. Get help right away if you cannot move your knee or your knee pain is very bad. This information is not intended to replace advice given to you by your health care provider. Make sure you discuss any questions you have with your health care provider. Document Revised: 06/20/2019 Document Reviewed: 06/20/2019 Elsevier Patient Education  2023 Elsevier Inc.  

## 2021-11-20 ENCOUNTER — Ambulatory Visit
Admission: RE | Admit: 2021-11-20 | Discharge: 2021-11-20 | Disposition: A | Discharge status: Home / Self Care | Payer: Medicare HMO | Source: Ambulatory Visit | Attending: Nurse Practitioner | Admitting: Nurse Practitioner

## 2021-11-20 DIAGNOSIS — M25561 Pain in right knee: Secondary | ICD-10-CM | POA: Diagnosis not present

## 2021-11-20 DIAGNOSIS — M1611 Unilateral primary osteoarthritis, right hip: Secondary | ICD-10-CM | POA: Diagnosis not present

## 2021-11-20 DIAGNOSIS — M25551 Pain in right hip: Secondary | ICD-10-CM | POA: Diagnosis not present

## 2021-11-20 DIAGNOSIS — G8929 Other chronic pain: Secondary | ICD-10-CM

## 2021-11-28 DIAGNOSIS — M1711 Unilateral primary osteoarthritis, right knee: Secondary | ICD-10-CM | POA: Diagnosis not present

## 2021-11-28 DIAGNOSIS — M1611 Unilateral primary osteoarthritis, right hip: Secondary | ICD-10-CM | POA: Diagnosis not present

## 2021-12-07 ENCOUNTER — Other Ambulatory Visit: Payer: Self-pay | Admitting: Nurse Practitioner

## 2021-12-07 DIAGNOSIS — G8929 Other chronic pain: Secondary | ICD-10-CM

## 2021-12-23 NOTE — Progress Notes (Signed)
71 y.o. G70P2002 Married White or Caucasian Not Hispanic or Latino female here for annual exam.  No vaginal bleeding. Not currently sexually active secondary to hip pain. No h/o dyspareunia.    She is going to have right hip replacement in the next few months.   No bowel or bladder c/o.  Patient's last menstrual period was 11/17/2002.          Sexually active: No.  The current method of family planning is vaginal spermicide and post menopausal status.    Exercising: No.   Smoker:  no  Health Maintenance: Pap: 12-21-17 neg HPV HR neg   11-26-14 neg HPV HR neg,  History of abnormal Pap:  yes, cryo years ago  MMG:  03/13/21 density B Bi-rads 1 neg  BMD:   03/21/19 Osteopenic T-score -1.2 Colonoscopy: 06/23/21 polyp otherwise normal. F/U 5 years h/o adenomatous polyps TDaP:  07/05/12 Gardasil: n/a   reports that she has never smoked. She has never used smokeless tobacco. She reports current alcohol use of about 7.0 standard drinks of alcohol per week. She reports that she does not use drugs. 2 grown children, 2 grandchildren (27 and 7). Everyone lives in [place]   Past Medical History:  Diagnosis Date   Abnormal Pap smear of cervix    Acute lower GI bleeding 02/16/2016   Blood transfusion without reported diagnosis 2018   Heart murmur    Hyperlipidemia    Hypertension    Prediabetes    Seasonal allergies    Thyroiditis 07/22/2017   Vitamin D deficiency     Past Surgical History:  Procedure Laterality Date   [place]   at age 55 for hole in heart   CERVIX LESION DESTRUCTION N/A ? years ago   with abnormal pap   COLONOSCOPY     polyps   COLONOSCOPY N/A 02/16/2016   Procedure: COLONOSCOPY;  Surgeon: Ladene Artist, MD;  Location: [facility];  Service: Endoscopy;  Laterality: N/A;   CRYOTHERAPY     for abnormal pap smear   TUBAL LIGATION      Current Outpatient Medications  Medication Sig Dispense Refill   acetaminophen (TYLENOL) 325 MG tablet Take 2 tablets (650 mg  total) by mouth every 6 (six) hours as needed for mild pain (or Fever >/= 101). 30 tablet 0   amLODipine (NORVASC) 5 MG tablet Take  1 tablet  Daily for BP                               /                                 Take                        by                        mouth                     once daily 90 tablet 3   aspirin EC 81 MG tablet Take 81 mg by mouth every other day.      calcium carbonate (OS-CAL) 600 MG TABS tablet Take 600 mg by mouth daily with breakfast.     Cholecalciferol (VITAMIN D3) 125 MCG (5000 UT) CAPS Take 5,000  Units by mouth daily.     cyclobenzaprine (FLEXERIL) 5 MG tablet Take 1 tablet (5 mg total) by mouth 3 (three) times daily as needed for muscle spasms. 60 tablet 0   fish oil-omega-3 fatty acids 1000 MG capsule Take 1 g by mouth 3 (three) times daily with meals.      Flaxseed, Linseed, (FLAXSEED OIL PO) Take 1 capsule by mouth 3 (three) times daily with meals.      ibuprofen (ADVIL) 800 MG tablet TAKE 1 TABLET BY MOUTH EVERY 8 HOURS AS NEEDED 30 tablet 0   Multiple Vitamins-Minerals (CENTRUM SILVER PO) Take by mouth.     OVER THE COUNTER MEDICATION Takes Allergy meds as needed     No current facility-administered medications for this visit.    Family History  Problem Relation Age of Onset   Diabetes Father    Colon cancer Sister 35   Diabetes Sister    Diabetes Sister    Diabetes Brother    Stomach cancer Neg Hx    Breast cancer Neg Hx    Esophageal cancer Neg Hx    Rectal cancer Neg Hx     Review of Systems  All other systems reviewed and are negative.   Exam:   BP 128/80 (BP Location: Right Arm, Patient Position: Sitting, Cuff Size: Normal)   Pulse 70   Ht '5\' 8"'$  (1.727 m)   Wt 146 lb (66.2 kg)   LMP 11/17/2002   BMI 22.20 kg/m   Weight change: '@WEIGHTCHANGE'$ @ Height:   Height: '5\' 8"'$  (172.7 cm)  Ht Readings from Last 3 Encounters:  12/31/21 '5\' 8"'$  (1.727 m)  11/11/21 5' 8.5" (1.74 m)  06/23/21 5' 8.5" (1.74 m)    General appearance:  alert, cooperative and appears stated age Head: Normocephalic, without obvious abnormality, atraumatic Neck: no adenopathy, supple, symmetrical, trachea midline and thyroid normal to inspection and palpation Breasts: normal appearance, no masses or tenderness Abdomen: soft, non-tender; non distended,  no masses,  no organomegaly Extremities: extremities normal, atraumatic, no cyanosis or edema Skin: Skin color, texture, turgor normal. No rashes or lesions Lymph nodes: Cervical, supraclavicular, and axillary nodes normal. No abnormal inguinal nodes palpated Neurologic: Grossly normal   Pelvic: External genitalia:  no lesions              Urethra:  normal appearing urethra with no masses, tenderness or lesions              Bartholins and Skenes: normal                 Vagina: normal appearing vagina with normal color and discharge, no lesions              Cervix: no lesions               Bimanual Exam:  Uterus:  normal size, contour, position, consistency, mobility, non-tender              Adnexa: no mass, fullness, tenderness               Rectovaginal: Confirms               Anus:  normal sphincter tone, no lesions  Gae Dry, CMA chaperoned for the exam.  1. Encounter for breast and pelvic examination Discussed breast self exam Mammogram in 2/24 Labs with primary  2. Screening for cervical cancer - Cytology - PAP  3. History of osteopenia Discussed calcium and vit D intake Plan f/u DEXA in  2026    

## 2021-12-29 DIAGNOSIS — M1611 Unilateral primary osteoarthritis, right hip: Secondary | ICD-10-CM | POA: Diagnosis not present

## 2021-12-29 DIAGNOSIS — M25561 Pain in right knee: Secondary | ICD-10-CM | POA: Diagnosis not present

## 2021-12-31 ENCOUNTER — Encounter: Payer: Self-pay | Admitting: Obstetrics and Gynecology

## 2021-12-31 ENCOUNTER — Ambulatory Visit (INDEPENDENT_AMBULATORY_CARE_PROVIDER_SITE_OTHER): Payer: Medicare HMO | Admitting: Obstetrics and Gynecology

## 2021-12-31 ENCOUNTER — Other Ambulatory Visit (HOSPITAL_COMMUNITY)
Admission: RE | Admit: 2021-12-31 | Discharge: 2021-12-31 | Disposition: A | Discharge status: Home / Self Care | Payer: Medicare HMO | Source: Ambulatory Visit | Attending: Obstetrics and Gynecology | Admitting: Obstetrics and Gynecology

## 2021-12-31 VITALS — BP 128/80 | HR 70 | Ht 68.0 in | Wt 146.0 lb

## 2021-12-31 DIAGNOSIS — Z124 Encounter for screening for malignant neoplasm of cervix: Secondary | ICD-10-CM

## 2021-12-31 DIAGNOSIS — Z01419 Encounter for gynecological examination (general) (routine) without abnormal findings: Secondary | ICD-10-CM

## 2021-12-31 DIAGNOSIS — Z8739 Personal history of other diseases of the musculoskeletal system and connective tissue: Secondary | ICD-10-CM | POA: Diagnosis not present

## 2021-12-31 NOTE — Patient Instructions (Signed)

## 2022-01-05 LAB — CYTOLOGY - PAP: Diagnosis: NEGATIVE

## 2022-01-19 DIAGNOSIS — M79601 Pain in right arm: Secondary | ICD-10-CM | POA: Diagnosis not present

## 2022-01-19 DIAGNOSIS — M7701 Medial epicondylitis, right elbow: Secondary | ICD-10-CM | POA: Diagnosis not present

## 2022-01-20 DIAGNOSIS — M25551 Pain in right hip: Secondary | ICD-10-CM | POA: Diagnosis not present

## 2022-01-20 DIAGNOSIS — M1611 Unilateral primary osteoarthritis, right hip: Secondary | ICD-10-CM | POA: Diagnosis not present

## 2022-01-25 DIAGNOSIS — M25651 Stiffness of right hip, not elsewhere classified: Secondary | ICD-10-CM | POA: Diagnosis not present

## 2022-01-25 DIAGNOSIS — M1611 Unilateral primary osteoarthritis, right hip: Secondary | ICD-10-CM | POA: Diagnosis not present

## 2022-01-25 DIAGNOSIS — R262 Difficulty in walking, not elsewhere classified: Secondary | ICD-10-CM | POA: Diagnosis not present

## 2022-01-29 DIAGNOSIS — Z96641 Presence of right artificial hip joint: Secondary | ICD-10-CM | POA: Diagnosis not present

## 2022-01-29 DIAGNOSIS — M1611 Unilateral primary osteoarthritis, right hip: Secondary | ICD-10-CM | POA: Diagnosis not present

## 2022-02-01 DIAGNOSIS — Z96641 Presence of right artificial hip joint: Secondary | ICD-10-CM | POA: Diagnosis not present

## 2022-02-01 DIAGNOSIS — M6281 Muscle weakness (generalized): Secondary | ICD-10-CM | POA: Diagnosis not present

## 2022-02-01 DIAGNOSIS — M25651 Stiffness of right hip, not elsewhere classified: Secondary | ICD-10-CM | POA: Diagnosis not present

## 2022-02-03 ENCOUNTER — Telehealth: Payer: Self-pay | Admitting: Internal Medicine

## 2022-02-03 DIAGNOSIS — M6281 Muscle weakness (generalized): Secondary | ICD-10-CM | POA: Diagnosis not present

## 2022-02-03 DIAGNOSIS — Z96641 Presence of right artificial hip joint: Secondary | ICD-10-CM | POA: Diagnosis not present

## 2022-02-03 DIAGNOSIS — M25651 Stiffness of right hip, not elsewhere classified: Secondary | ICD-10-CM | POA: Diagnosis not present

## 2022-02-03 NOTE — Progress Notes (Signed)
                                  Care Management & Coordination Services (CMCS) Consent     02/03/2022 Name: [name] [name] MRN: [id number] DOB: 1950/10/25    An unsuccessful telephone outreach was attempted today. The patient was referred to the pharmacist for assistance with care management and care coordination.     Follow Up Plan:   Tatjana Dellinger Upstream Scheduler

## 2022-02-09 DIAGNOSIS — Z96641 Presence of right artificial hip joint: Secondary | ICD-10-CM | POA: Diagnosis not present

## 2022-02-09 DIAGNOSIS — Z9889 Other specified postprocedural states: Secondary | ICD-10-CM | POA: Diagnosis not present

## 2022-02-10 DIAGNOSIS — Z96641 Presence of right artificial hip joint: Secondary | ICD-10-CM | POA: Diagnosis not present

## 2022-02-10 DIAGNOSIS — M6281 Muscle weakness (generalized): Secondary | ICD-10-CM | POA: Diagnosis not present

## 2022-02-10 DIAGNOSIS — M25651 Stiffness of right hip, not elsewhere classified: Secondary | ICD-10-CM | POA: Diagnosis not present

## 2022-02-12 DIAGNOSIS — M25651 Stiffness of right hip, not elsewhere classified: Secondary | ICD-10-CM | POA: Diagnosis not present

## 2022-02-12 DIAGNOSIS — M6281 Muscle weakness (generalized): Secondary | ICD-10-CM | POA: Diagnosis not present

## 2022-02-12 DIAGNOSIS — Z96641 Presence of right artificial hip joint: Secondary | ICD-10-CM | POA: Diagnosis not present

## 2022-02-17 DIAGNOSIS — Z96641 Presence of right artificial hip joint: Secondary | ICD-10-CM | POA: Diagnosis not present

## 2022-02-17 DIAGNOSIS — M6281 Muscle weakness (generalized): Secondary | ICD-10-CM | POA: Diagnosis not present

## 2022-02-17 DIAGNOSIS — M25651 Stiffness of right hip, not elsewhere classified: Secondary | ICD-10-CM | POA: Diagnosis not present

## 2022-02-19 DIAGNOSIS — M25651 Stiffness of right hip, not elsewhere classified: Secondary | ICD-10-CM | POA: Diagnosis not present

## 2022-02-19 DIAGNOSIS — M6281 Muscle weakness (generalized): Secondary | ICD-10-CM | POA: Diagnosis not present

## 2022-02-19 DIAGNOSIS — Z96641 Presence of right artificial hip joint: Secondary | ICD-10-CM | POA: Diagnosis not present

## 2022-02-22 ENCOUNTER — Telehealth: Payer: Self-pay | Admitting: Internal Medicine

## 2022-02-22 DIAGNOSIS — M6281 Muscle weakness (generalized): Secondary | ICD-10-CM | POA: Diagnosis not present

## 2022-02-22 DIAGNOSIS — Z96641 Presence of right artificial hip joint: Secondary | ICD-10-CM | POA: Diagnosis not present

## 2022-02-22 DIAGNOSIS — M25651 Stiffness of right hip, not elsewhere classified: Secondary | ICD-10-CM | POA: Diagnosis not present

## 2022-02-22 NOTE — Progress Notes (Signed)
  Chronic Care Management   Note  02/22/2022 Name: BRITIANY SILBERNAGEL MRN: [id number] DOB: 25-Nov-1950  ILEANA CHALUPA is a 72 y.o. year old female who is a primary care patient of Unk Pinto, MD. I reached out to Blenda Bridegroom by phone today in response to a referral sent by Ms. Despina Pole PCP, Unk Pinto, MD.   Ms. Pendergraph was given information about Chronic Care Management services today including:  CCM service includes personalized support from designated clinical staff supervised by her physician, including individualized plan of care and coordination with other care providers 24/7 contact phone numbers for assistance for urgent and routine care needs. Service will only be billed when office clinical staff spend 20 minutes or more in a month to coordinate care. Only one practitioner may furnish and bill the service in a calendar month. The patient may stop CCM services at any time (effective at the end of the month) by phone call to the office staff.   Patient agreed to services and verbal consent obtained.   Follow up plan:   Tatjana Secretary/administrator

## 2022-02-23 ENCOUNTER — Ambulatory Visit: Payer: Self-pay | Admitting: Pharmacist

## 2022-02-24 DIAGNOSIS — Z96641 Presence of right artificial hip joint: Secondary | ICD-10-CM | POA: Diagnosis not present

## 2022-02-24 DIAGNOSIS — M25651 Stiffness of right hip, not elsewhere classified: Secondary | ICD-10-CM | POA: Diagnosis not present

## 2022-02-24 DIAGNOSIS — M6281 Muscle weakness (generalized): Secondary | ICD-10-CM | POA: Diagnosis not present

## 2022-02-25 ENCOUNTER — Telehealth: Payer: Self-pay | Admitting: Pharmacy Technician

## 2022-02-25 NOTE — Progress Notes (Signed)
02/08/2024[name] DOB: 1950-12-16  Review office visits, consults, Medication changes and Chronic conditions for Initial CCM visit with CPP on 03/02/22@ 10:30am Phone.   PCP- 11/11/21-Wilkinson, Raeford Razor, NP-Knee Pain. Start: cyclobenzaprine (FLEXERIL) 5 MG, ibuprofen (ADVIL) 800 MG. Discontinued: meloxicam ([place]) 15 MG  Specialist- 12/31/21-Jertson, Francesca Jewett, MD(Obstetrics and Gynecology)-No medication changes.  12/29/21-Rowan, Frank(Orthopedic Surgery)-Unknown medication changes 11/28/21-Rowan, Frank(Orthopedic Surgery)-Unknown medication changes  No Hospital visits in the last 6 months  Pre Call Questions: Date::02/25/2022 Time::AM Outcome::Successful Confirmed appointment date/time with patient/caregiver?: Yes Date/time of the appointment: 03/02/22 Visit type: Phone Patient instructed to bring medications to appointment: Yes What, if any, problems do you have getting your medications from the pharmacy?: None What is your top health concern to discuss at your upcoming visit?: None  Time Spent: [place] mins  [name], [name]

## 2022-03-01 DIAGNOSIS — M25651 Stiffness of right hip, not elsewhere classified: Secondary | ICD-10-CM | POA: Diagnosis not present

## 2022-03-01 DIAGNOSIS — M6281 Muscle weakness (generalized): Secondary | ICD-10-CM | POA: Diagnosis not present

## 2022-03-01 DIAGNOSIS — Z96641 Presence of right artificial hip joint: Secondary | ICD-10-CM | POA: Diagnosis not present

## 2022-03-02 ENCOUNTER — Telehealth: Payer: Medicare HMO | Admitting: Pharmacy Technician

## 2022-03-02 ENCOUNTER — Ambulatory Visit: Payer: Self-pay | Admitting: Pharmacist

## 2022-03-04 DIAGNOSIS — M6281 Muscle weakness (generalized): Secondary | ICD-10-CM | POA: Diagnosis not present

## 2022-03-04 DIAGNOSIS — Z96641 Presence of right artificial hip joint: Secondary | ICD-10-CM | POA: Diagnosis not present

## 2022-03-04 DIAGNOSIS — M25651 Stiffness of right hip, not elsewhere classified: Secondary | ICD-10-CM | POA: Diagnosis not present

## 2022-03-10 DIAGNOSIS — M25561 Pain in right knee: Secondary | ICD-10-CM | POA: Diagnosis not present

## 2022-03-11 ENCOUNTER — Other Ambulatory Visit: Payer: Self-pay | Admitting: Nurse Practitioner

## 2022-03-11 DIAGNOSIS — G8929 Other chronic pain: Secondary | ICD-10-CM

## 2022-03-16 ENCOUNTER — Telehealth: Payer: Self-pay | Admitting: Pharmacist

## 2022-03-16 NOTE — Telephone Encounter (Signed)
error 

## 2022-03-16 NOTE — Progress Notes (Signed)
Date of service correction: 03/02/2022  CP Care Plan  [name],[name]  72 years, Female  DOB: [birth year]  M:   __________________________________________________ General Information Details Chronic Conditions: Hypertension (HTN), Osteopenia, Hyperlipidemia/Dyslipidemia (HLD) Contact your clinical pharmacist and care team with any questions or concerns. Team Phone #:: [phone] Clinical Pharmacist:: Naida Sleight [place]/Nakita Santerre [place]:: [place] High Blood Pressure GOAL: Maintain Blood Pressure less than: 140/90 Your most recent BP is: 120/80 taken on: 12/31/2021 Patient education:: Recommend following the DASH diet, which emphasizes fruits and vegetables and low-fat dairy products along with whole grains, fish, poultry, and nuts. Reduce red meats and sugars., Recommend limiting the daily amount of salt intake to less than '1500mg'$ /per day or 2/3 teaspoonful a day, Educated patient to call PCP office if you develop episodes of low blood pressure, dizziness, falls or increased headaches, and/or swelling of legs and feet., Discussed increasing exercise (walking, biking, swimming) to a goal of 30 minutes per day, as able based on current activity level and health and/or as directed by your healthcare provider., Discussed ways to increase movement. Discussed checking and recording home blood pressure readings: Daily Your current blood pressure medications are:: amlodipine (NORVASC) 5 MG High Cholesterol GOAL:: Maintain triglycerides less than 150, Maintain LDL (bad) cholesterol less than 100 Your most recent LDL level is: 103 Your most recent triglycerides level is: 206 taken on: 07/14/2021 Patient education:: Discussed increasing exercise (walking, biking, swimming) to a goal of 30 minutes per day, as able based on current activity level and health and/or as directed by your healthcare provider., Discussed how a diet high in fruits/vegetables/nuts/whole grains/beans may  help to reduce your cholesterol. Increasing soluble fiber intake (whole grains, fruits, vegetables, beans, nuts and seeds). Recommended avoiding sugary foods and trans fat, limiting carbohydrates, and reducing portion sizes. Recommended Patient to incorporate more healthy fats (salmon, cold-water fish, almonds, walnuts) into their diet. Your current cholesterol medications are:: fish oil-omega-3 fatty acids 1000 MG Osteopenia GOAL:: Prevent fractures and disabilities related to osteopenia Patient education:: Recommend vitamin D 800 IU once a day to help improve low vitamin D levels and healthy bones., Recommend daily calcium intake of 1,200 mg per day.  Calcium is found in foods like yogurt, milk, orange juice with calcium, dark green leafy vegetables, and cheese.  If additional calcium supplement is needed, calcium citrate is the preferred form of calcium., Recommend doing weight bearing exercises, such as walking, light weights, and/or resistance training as tolerated., To avoid falls, recommend removing any loose throw rugs from walkways and moving any objects obstructing pathways in the home. Ensure there is adequate lighting, especially if moving around the house at night. Contact your primary care provider or pharmacist if any of the medications you take make you feel dizzy or too drowsy. Your current osteopenia medications are:: Cholecalciferol (VITAMIN D3) 125 MCG (5000 units) - 1 capsule daily Calcium carbonate '600mg'$ - 1 tablet daily with breakfast Other Needs Recommended Preventative Health Care:: Recommend scheduling Annual Wellness Visit, Recommend breast cancer screening, Recommend a bone density screening for women over age 70  CPP Initial Care Plan Time: 5 min

## 2022-03-17 NOTE — Progress Notes (Signed)
Initial Pharmacist Visit (CCM)  [name] [name],[name] [name]  66 years, Female  DOB: 1950-06-17  M:   __________________________________________________ Summary for PCP:  1. Recovering from [facility] well, no pain medications at this time; last PT session this Thursday. 2. HTN well controlled at this time with amlodipine; denies hypotension, edema. Discussed goals of care with patient. 3. HLD- slightly above goal LDL, but patient has ability to be more active now that hip has been replaced and discussed incorporating exercise into daily routine. No therapeutic recommendations to treatment at this time.  Visit Details Patient scheduled for CCM visit with the clinical pharmacist.  Patient is referred for CCM by their PCP and Clinical Pharmacist is under general PCP supervision.: At least 2 of these conditions are expected to last 12 months or longer and patient is at significant risk for acute exacerbations and/or functional decline.  Patient has consented to participation in [facility] program. Visit Type: Phone Call Date of Upcoming Visit: 03/02/2022 Patient Chart Prep [facility]) Chronic Conditions Patient's Chronic Conditions: Hypertension (HTN), Osteopenia or Osteoporosis, Hyperlipidemia/Dyslipidemia (HLD), Vitamins/Supplements Doctor and Hospital Visits Were there PCP Visits in last 6 months?: Yes PCP Visits details: 11/11/21-Wilkinson, Raeford Razor, NP-Knee Pain. Start: cyclobenzaprine (FLEXERIL) 5 MG, ibuprofen (ADVIL) 800 MG. Discontinued: meloxicam (MOBIC) 15 MG Were there Specialist Visits in last 6 months?: Yes Specialist Visits details: 12/31/21-Jertson, Francesca Jewett, MD(Obstetrics and Gynecology)-No medication changes. 12/29/21-Rowan, Frank(Orthopedic Surgery)-Unknown medication changes 11/28/21-Rowan, Frank(Orthopedic Surgery)-Unknown medication changes   Was there a Hospital Visit in last 30 days?: No Were there other Hospital Visits in last 6 months?: No Pre-Call Questions (HC) Pre-Call Questions Date::  02/25/2022 Time:: AM Outcome:: Successful Confirmed appointment date/time with patient/caregiver?: Yes Date/time of the appointment: 03/02/22 Visit type: Phone Patient/Caregiver instructed to bring medications to appointment: Yes What, if any, problems do you have getting your medications from the pharmacy?: None What is your top health concern to discuss at your upcoming visit?: None Have you seen any other providers since your last visit?: No Disease Assessments Visit Date Visit Completed on: 03/02/2022 Subjective Information Did patient bring medications to appointment?: No What source (s) was used to reconcile medications this visit?: Patient/caregiver list, EMR list Subjective: Completed telemedicine visit with patient for Initial visit for CCM Program. Mrs. Gorzynski is married, they have 2 children and 2 grandchildren. She enjoys being a 'home body', spending time with grandchildren, gardening. She recently had a THR in January- 5 weeks post op and is recovering well. Reports not needing pain medications and only needing walker/cane for stability for 1-2 days post op around the house. She is still in PT with her last session is this Thursday. She has not been able to do much with recent hip problems so she is excited with what she will be able to accomplish with her new hip! Lifestyle habits: Diet: standard diet Exercise: in PT Tobacco: denies Alcohol: 1 glass per day Caffeine: 1-2 cups coffee AM Recreational drugs: denies What is the patient's sleep pattern?: No sleep issues How many hours per night does patient typically sleep?: 8-9 SDOH: [place] Scre[employer] ([email or web address]) SDOH questions completed during initial visit?: Yes What is your living situation today? (ref #1): I have a steady place to live Think about the place you live. Do you have problems with any of the  following? (ref #2): None of the above Within the past 12 months, you worried that your food would run out before you got money to buy more (ref #3): Never true Within  the past 12 months, the food you bought just didn't last and you didn't have money to get more (ref #4): Never true In the past 12 months, has lack of reliable transportation kept you from medical appointments, meetings, work or from getting things needed for daily living? (ref #5): No In the past 12 months, has the electric, gas, oil, or water company threatened to shut off services in your home? (ref #6): No How often does anyone, including family and friends, physically hurt you? (ref #7): Never (1) How often does anyone, including family and friends, insult or talk down to you? (ref #8): Never (1) How often does anyone, including friends and family, threaten you with harm? (ref #9): Never (1) How often does anyone, including family and friends, scream or curse at you? (ref #10): Never (1) Medication Adherence Does the [facility] have access to medication refill history?: Yes Medication adherence rates for STAR metric medications: amlodipine (NORVASC) 5 MG- Flag checked 12/31/21 90DS Medication adherence rates for non-STAR metric medications: No 5 day fill gap Name and location of Current pharmacy: [place] Current Rx insurance plan: [employer] Are meds synced by current pharmacy?: No Are meds delivered by current pharmacy?: No - delivery not available Would patient benefit from direct intervention of clinical lead in dispensing process to optimize clinical outcomes?: No Are [facility] pharmacy services available where patient lives?: Yes Is patient disadvantaged to use [facility]?: Yes Medication organization: pill box- self organizes Assessment:: Adherent Hypertension (HTN) Most Recent BP: 120/80 Most Recent HR: 70 taken on: 12/31/2021 Care Gap: Need BP documented or last BP 140/90 or higher: Addressed Assessed  today?: Yes Goal: <140/90 mmHG Is Patient checking BP at home?: Yes Patient home BP readings are ranging: 120-130/70-80 Has patient experienced hypotension, dizziness, falls or bradycardia?: No We discussed: Proper Home BP Measurement, Contacting PCP office for signs and symptoms of high or low blood pressure (hypotension, dizziness, falls, headaches, edema), Increasing movement, Increasing exercise (walking, biking, swimming) to a goal of 30 minutes per day, as able based on current activity level and health or as directed by your healthcare provider. Assessment:: Controlled Drug: amlodipine (NORVASC) 5 MG Pharmacist Assessment: Appropriate, Effective, Safe, Accessible Plan/Follow up: Controlled; both home measurements and clinic measurements are at goal. Tolerating medication well with no side effects, hypotension, or edema. Continue amlodipine. Counseled pt on s/sx of hypotension and hypertension, BP goals, and repeat readings on when to call the office (highs and lows).  Hyperlipidemia/Dyslipidemia (HLD) Last Lipid panel on: 07/14/2021 TC (Goal<200): 197 LDL: 103 HDL (Goal>40): 62 TG (Goal<150): 206 ASCVD 10-year risk?is:: N/A due to lab values outside the range Assessed today?: Yes LDL Goal: <100 We discussed: How a diet high in fruits/vegetables/nuts/whole grains/beans may help to reduce your cholesterol. Increasing soluble fiber intake.  Avoiding sugary foods and trans fat, limiting carbohydrates, and reducing portion sizes. Recommended increasing intake of healthy fats into their diet, Increasing exercise (walking, biking, swimming) to a goal of 30 minutes per day, as able based on current activity level and health or as directed by your healthcare provider Assessment:: Uncontrolled Drug: fish oil-omega-3 fatty acids 1000 MG Pharmacist Assessment: Appropriate, Query Effectiveness Plan/Follow up: Slightly above goal, but would consider controlled. Discussed ability to increase  exercise now that hip has been replaced and pt has increased ability to incorporate exercise into daily routine. Feel confident that pt can achieve goal of LDL < 100 at next lab draw. Review upcoming labs scheduled for AWV in June- continue to  monitor, no recommendations to medication changes at this time. Osteopenia or Osteoporosis Most recent T-score: -1.2 taken on: 03/21/2019 Most recent Vitamin D 25-OH: 74 taken on: 04/09/2021 Assessed today?: Yes Patient has: Osteopenia In the past 12 months, have you fallen?: No Are there any stairs in or around the home?: No Is the home free of loose throw rugs in walkways, pet beds, electrical cords, etc.?: No Is there adequate lighting in your home to reduce the risk of falls?: Yes Risk Factors: Alcohol Use We discussed: Vitamin D supplementation Assessment:: Controlled Drug: Cholecalciferol (VITAMIN D3) 125 MCG Pharmacist Assessment: Appropriate, Effective, Safe, Accessible Drug: Calcium Carbonate '600mg'$  - 1 tablet with breakfast  Pharmacist Assessment: Appropriate, Effective, Safe, Accessible Plan/Follow up: Controlled; no reported recent falls or injuries. Discussed increasing aerobic activity and weight bearing exercises into daily routine. Continue Calcium and Vitamin D supplementation Vitamins / Supplements We discussed: Confirming with pharmacist the safety of new vitamins / supplements with current medications before starting / adding to medication regimen Drug: Multiple Vitamins-Minerals (CENTRUM SILVER PO) Pharmacist Assessment: Appropriate, Effective, Safe, Accessible Preventative Health Care Gap: Colorectal cancer screening: Addressed Care Gap: Breast cancer screening: Needs to be addressed Care Gap: Annual Wellness Visit (AWV): Needs to be addressed Clinical Summary Summary Next Pharmacist Follow Up: 6 months- HTN, Osteopenia  Next AWV: Needs to schedule- pt aware Recommended CRN Follow-Up in: Not indicated at this  time Physician Referral Statement:: I reaffirm my previous referral of patient for CCM services Attestation Statement:: CCM Services:  This encounter meets complex CCM services and moderate to high medical decision making.  Prior to outreach and patient consent for Chronic Care Management, I referred this patient for services after reviewing the nominated patient list or from a personal encounter with the patient.  I have personally reviewed this encounter including the documentation in this note and have collaborated with the care management provider regarding care management and care coordination activities to include development and update of the comprehensive care plan I am certifying that I agree with the content of this note and encounter as supervising physician. Pharmacist Interventions Intervention Details Pharmacist Interventions discussed: Yes Monitoring: Preventative health screenings, Routine monitoring Education: Lifestyle modifications, Medication administration Follow Ups CRN Follow Up and Interim Scheduling CRN visit Scheduled and protocol assigned: N/A Care Plan created and initiated: Done Pharmacist's Follow Up scheduled and Follow Up Protocol assigned: [place] Disease State Review Calls assigned: Done CTL Checklist Care Plan protocol added to mail merge spreadsheet: [place] uploaded into EMR: Done Review Care Team assignment.  Assign and Complete Care Team Update protocol if Care Team, Primary and/or Concierge are not accurate: Reviewed - Care Team accurate  CPP Chart Prep: 8 min  CPP Office Visit: 25 min  CPP Office Visit Documentation: 30 min  CPP Initial Care Plan: 5 min

## 2022-03-18 DIAGNOSIS — E782 Mixed hyperlipidemia: Secondary | ICD-10-CM | POA: Diagnosis not present

## 2022-03-18 DIAGNOSIS — I10 Essential (primary) hypertension: Secondary | ICD-10-CM | POA: Diagnosis not present

## 2022-03-26 ENCOUNTER — Other Ambulatory Visit: Payer: Self-pay | Admitting: Internal Medicine

## 2022-03-26 DIAGNOSIS — Z1231 Encounter for screening mammogram for malignant neoplasm of breast: Secondary | ICD-10-CM

## 2022-04-12 NOTE — Progress Notes (Unsigned)
COMPLETE PHYSICAL  Assessment / Plan:   Diagnoses and all orders for this visit:  Encounter for general adult medical examination with abnormal findings Yearly  BMI 22.0-22.9, adult Discussed dietary and exercise modifications  Essential hypertension Continue current medications: amlodipine 5mg   Monitor blood pressure at home; call if consistently over 130/80 Continue DASH diet.   Reminder to go to the ER if any CP, SOB, nausea, dizziness, severe HA, changes vision/speech, left arm numbness and tingling and jaw pain. -     CBC with Differential/Platelet -     TSH -     COMPLETE METABOLIC PANEL WITH GFR -     EKG 12-Lead  PAC Bigeminy Start Verapamil 180 mg daily Limit caffeine Return in 1 week for repeat EKG Go to the ER if any chest pain, shortness of breath, nausea, dizziness, severe HA, changes vision/speech   Hyperlipidemia, mixed Continue medications: Omega3 1,000mg  TID Discussed dietary and exercise modifications Low fat diet -     Lipid panel  Iron deficiency anemia, unspecified iron deficiency anemia type - CBC  Medication management Continued  Vitamin D deficiency Continue supplementation to maintain goal of 70-100 Taking Vitamin D 5,000 IU daily -     VITAMIN D 25 Hydroxy (Vit-D Deficiency, Fractures)  Seasonal allergies Doing well at this time OTC PRN  Trapezius muscle spasm, left Declined trigger point injections Use moist heat, Tylenol and Muscle relaxant she has from hip replacement. Monitor, if no improvement notify the office  Osteopenia Left femoral neck T score - 1.2 03/21/19 Continue Calcium and Vit D Continue weight bearing exercises  Screening, ischemic heart disease -     EKG 12-Lead  Screening for blood or protein in urine -     Routine UA with reflex microscopic -  Microalbumin/creatinine urine ratio  Abnormal glucose Continue diet and exercise -     Hemoglobin A1c  B12 deficiency -     Vitamin B12  Screening for thyroid  disorder - TSH  Screening for AAA - U/S ABD Retroperitoneal LTD   Further disposition pending results if labs check today. Discussed med's effects and SE's.   Over 30 minutes of face to face interview, exam, counseling, chart review, and critical decision making was performed.   Future Appointments  Date Time Provider [place]  05/14/2022 10:20 AM [facility] 3 [facility]  07/15/2022  9:00 AM Darrol Jump, NP [facility] None  04/13/2023  2:00 PM Alycia Rossetti, NP [facility] None     Subjective:  [name] [name] is a 72 y.o. female who presents for Medicare Annual Wellness Visit and 3 month follow up.  She has been having pulled aching pain on left side of her neck when she turns to the left x 1 week.   Patient gets her yearly mammogram and pelvic exam at GYN.   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  Her husband had his knee replaced 03/19/22 and isn't doing as well and she is taking care of him.   Her blood pressure has been controlled at home, currently on Amlodipine 5 mg daily. Today their BP is BP: 122/62  BP Readings from Last 3 Encounters:  04/13/22 122/62  12/31/21 128/80  11/11/21 130/84  She does workout. She denies chest pain, shortness of breath, dizziness.   BMI is Body mass index is 22.41 kg/m., she is working on diet and exercise. Wt Readings from Last 3 Encounters:  04/13/22 147 lb 6.4 oz (  66.9 kg)  12/31/21 146 lb (66.2 kg)  11/11/21 144 lb 12.8 oz (65.7 kg)   She is not on cholesterol medication and denies myalgias. Her cholesterol is not at goal. The cholesterol last visit was:   Lab Results  Component Value Date   CHOL 197 07/14/2021   HDL 62 07/14/2021   LDLCALC 103 (H) 07/14/2021   TRIG 206 (H) 07/14/2021   CHOLHDL 3.2 07/14/2021    Last A1C in the office was:  Lab Results  Component Value Date   HGBA1C 5.6 04/09/2021   Last GFR: Lab Results  Component Value Date   EGFR 95 07/14/2021     Patient is on Vitamin D supplement.   Lab Results  Component Value Date   VD25OH 74 04/09/2021      Medication Review:   Current Outpatient Medications (Cardiovascular):    amLODipine (NORVASC) 5 MG tablet, Take  1 tablet  Daily for BP                               /                                 Take                        by                        mouth                     once daily   Current Outpatient Medications (Analgesics):    acetaminophen (TYLENOL) 325 MG tablet, Take 2 tablets (650 mg total) by mouth every 6 (six) hours as needed for mild pain (or Fever >/= 101).   aspirin EC 81 MG tablet, Take 81 mg by mouth every other day.    ibuprofen (ADVIL) 800 MG tablet, TAKE 1 TABLET BY MOUTH EVERY 8 HOURS AS NEEDED   Current Outpatient Medications (Other):    calcium carbonate (OS-CAL) 600 MG TABS tablet, Take 600 mg by mouth daily with breakfast.   Cholecalciferol (VITAMIN D3) 125 MCG (5000 UT) CAPS, Take 5,000 Units by mouth daily.   fish oil-omega-3 fatty acids 1000 MG capsule, Take 1 g by mouth 3 (three) times daily with meals.    Flaxseed, Linseed, (FLAXSEED OIL PO), Take 1 capsule by mouth 3 (three) times daily with meals.    Multiple Vitamins-Minerals (CENTRUM SILVER PO), Take by mouth.   OVER THE COUNTER MEDICATION, Takes Allergy meds as needed   cyclobenzaprine (FLEXERIL) 5 MG tablet, Take 1 tablet (5 mg total) by mouth 3 (three) times daily as needed for muscle spasms. (Patient not taking: Reported on 04/13/2022)  Allergies Allergies  Allergen Reactions   Amoxil [Amoxicillin] Rash   Ampicillin Rash    Current Problems (verified) Patient Active Problem List   Diagnosis Date Noted   Abnormal glucose 08/20/2018   History of thyroiditis 03/26/2018   Osteopenia of left femoral neck 03/23/2018   Cystocele with prolapse 03/23/2018   BMI 22.0-22.9, adult 06/01/2017   Medication management 07/26/2014   Hyperlipidemia, mixed    Seasonal allergies    Vitamin D  deficiency    Hypertension    History of colonic polyps 03/23/2011    Screening Tests Immunization History  Administered Date(s) Administered  Influenza Inj Mdck Quad With Preservative 01/03/2020   Influenza Split 01/03/2013   Influenza, High Dose Seasonal PF 10/23/2015, 11/08/2016, 11/25/2017, 10/04/2018, 11/06/2020, 10/29/2021   PPD Test 07/13/2013   Pneumococcal Conjugate-13 01/08/2016   Pneumococcal Polysaccharide-23 12/19/2017   Pneumococcal-Unspecified 01/18/2002   Tdap 01/19/2003, 07/05/2012   Zoster, Live 07/05/2012   Health Maintenance  Topic Date Due   COVID-19 Vaccine (1) Never done   Zoster Vaccines- Shingrix (1 of 2) Never done   MAMMOGRAM  03/13/2022   DTaP/Tdap/Td (3 - Td or Tdap) 07/06/2022   Medicare Annual Wellness (AWV)  07/15/2022   COLONOSCOPY (Pts 45-94yrs Insurance coverage will need to be confirmed)  06/24/2026   Pneumonia Vaccine 21+ Years old  Completed   INFLUENZA VACCINE  Completed   DEXA SCAN  Completed   Hepatitis C Screening  Completed   HPV VACCINES  Aged Out   Health Maintenance  Topic Date Due   COVID-19 Vaccine (1) Never done   Zoster Vaccines- Shingrix (1 of 2) Never done   MAMMOGRAM  03/13/2022   DTaP/Tdap/Td (3 - Td or Tdap) 07/06/2022   Medicare Annual Wellness (AWV)  07/15/2022   COLONOSCOPY (Pts 45-70yrs Insurance coverage will need to be confirmed)  06/24/2026   Pneumonia Vaccine 66+ Years old  Completed   INFLUENZA VACCINE  Completed   DEXA SCAN  Completed   Hepatitis C Screening  Completed   HPV VACCINES  Aged Out     Names of Other Physician/Practitioners you currently use: 1. [facility] here for primary care Patient Care Team: Unk Pinto, MD as PCP - General (Internal Medicine)  2. Eye Exam: 05/22/21 3. Dental Exam: 2022 Q26months  SURGICAL HISTORY She  has a past surgical history that includes Cardiac surgery (1958); Colonoscopy; Cervix lesion destruction (N/A, ? years  ago); Tubal ligation; Colonoscopy (N/A, 02/16/2016); and Cryotherapy. FAMILY HISTORY Her family history includes Colon cancer (age of onset: 36) in her sister; Diabetes in her brother, father, sister, and sister. SOCIAL HISTORY She  reports that she has never smoked. She has never used smokeless tobacco. She reports current alcohol use of about 7.0 standard drinks of alcohol per week. She reports that she does not use drugs.  Review of Systems  Constitutional:  Negative for chills and fever.  HENT:  Negative for congestion, hearing loss, sinus pain, sore throat and tinnitus.   Eyes:  Negative for blurred vision and double vision.  Respiratory:  Negative for cough, hemoptysis, sputum production, shortness of breath and wheezing.   Cardiovascular:  Negative for chest pain, palpitations and leg swelling.  Gastrointestinal:  Negative for abdominal pain, constipation, diarrhea, heartburn, nausea and vomiting.  Genitourinary:  Negative for dysuria and urgency.  Musculoskeletal:  Positive for neck pain. Negative for back pain, falls, joint pain and myalgias.  Skin:  Negative for rash.  Neurological:  Negative for dizziness, tingling, tremors, weakness and headaches.  Endo/Heme/Allergies:  Does not bruise/bleed easily.  Psychiatric/Behavioral:  Negative for depression and suicidal ideas. The patient is not nervous/anxious and does not have insomnia.      Objective:     Today's Vitals   04/13/22 1348  BP: 122/62  Pulse: (!) 48  Temp: (!) 97.5 F (36.4 C)  SpO2: 95%  Weight: 147 lb 6.4 oz (66.9 kg)  Height: 5\' 8"  (1.727 m)     Body mass index is 22.41 kg/m.  General appearance: alert, no distress, WD/WN, female HEENT: normocephalic, sclerae anicteric, TMs pearly, nares patent, no discharge  or erythema, pharynx normal Oral cavity: MMM, no lesions Neck: supple, no lymphadenopathy, no thyromegaly, no masses Heart: irregularly irregular, normal S1, S2, no murmurs Lungs: CTA bilaterally,  no wheezes, rhonchi, or rales Abdomen: +bs, soft, non tender, non distended, no masses, no hepatomegaly, no splenomegaly Musculoskeletal: nontender, no swelling, no obvious deformity. Left trapezius muscle spasm is felt on exam. Patient is able to ambulate well.  Gait is normal. Good distal sensations and pulses bilaterally. Extremities: no edema, no cyanosis, no clubbing Pulses: 2+ symmetric, upper and lower extremities, normal cap refill Neurological: alert, oriented x 3, CN2-12 intact, strength normal upper extremities and lower extremities, sensation normal throughout, DTRs 2+ throughout, no cerebellar signs, gait normal Psychiatric: normal affect, behavior normal, pleasant  EKG:PAC Bigeminy AAA: < 3 cm  [facility]  04/13/2022

## 2022-04-13 ENCOUNTER — Ambulatory Visit (INDEPENDENT_AMBULATORY_CARE_PROVIDER_SITE_OTHER): Payer: Medicare HMO | Admitting: Nurse Practitioner

## 2022-04-13 ENCOUNTER — Encounter: Payer: Self-pay | Admitting: Nurse Practitioner

## 2022-04-13 VITALS — BP 122/62 | HR 48 | Temp 97.5°F | Ht 68.0 in | Wt 147.4 lb

## 2022-04-13 DIAGNOSIS — Z136 Encounter for screening for cardiovascular disorders: Secondary | ICD-10-CM | POA: Diagnosis not present

## 2022-04-13 DIAGNOSIS — M62838 Other muscle spasm: Secondary | ICD-10-CM

## 2022-04-13 DIAGNOSIS — M85852 Other specified disorders of bone density and structure, left thigh: Secondary | ICD-10-CM

## 2022-04-13 DIAGNOSIS — Z79899 Other long term (current) drug therapy: Secondary | ICD-10-CM | POA: Diagnosis not present

## 2022-04-13 DIAGNOSIS — I700 Atherosclerosis of aorta: Secondary | ICD-10-CM

## 2022-04-13 DIAGNOSIS — Z1389 Encounter for screening for other disorder: Secondary | ICD-10-CM | POA: Diagnosis not present

## 2022-04-13 DIAGNOSIS — E559 Vitamin D deficiency, unspecified: Secondary | ICD-10-CM

## 2022-04-13 DIAGNOSIS — Z0000 Encounter for general adult medical examination without abnormal findings: Secondary | ICD-10-CM

## 2022-04-13 DIAGNOSIS — J302 Other seasonal allergic rhinitis: Secondary | ICD-10-CM

## 2022-04-13 DIAGNOSIS — R7309 Other abnormal glucose: Secondary | ICD-10-CM

## 2022-04-13 DIAGNOSIS — D509 Iron deficiency anemia, unspecified: Secondary | ICD-10-CM | POA: Diagnosis not present

## 2022-04-13 DIAGNOSIS — Z1329 Encounter for screening for other suspected endocrine disorder: Secondary | ICD-10-CM

## 2022-04-13 DIAGNOSIS — Z0001 Encounter for general adult medical examination with abnormal findings: Secondary | ICD-10-CM

## 2022-04-13 DIAGNOSIS — E782 Mixed hyperlipidemia: Secondary | ICD-10-CM | POA: Diagnosis not present

## 2022-04-13 DIAGNOSIS — I498 Other specified cardiac arrhythmias: Secondary | ICD-10-CM

## 2022-04-13 DIAGNOSIS — I10 Essential (primary) hypertension: Secondary | ICD-10-CM | POA: Diagnosis not present

## 2022-04-13 DIAGNOSIS — Z6821 Body mass index (BMI) 21.0-21.9, adult: Secondary | ICD-10-CM

## 2022-04-13 MED ORDER — VERAPAMIL HCL ER 180 MG PO TBCR: EXTENDED_RELEASE_TABLET | ORAL | 1 refills | Status: DC

## 2022-04-13 NOTE — Patient Instructions (Signed)
Premature Atrial Contraction  A premature atrial contraction (PAC) is a kind of irregular heartbeat (arrhythmia). The heart has four chambers, including the upper chambers (atria) and lower chambers (ventricles). Normally, an electrical signal starts in a group of cells called the sinoatrial node (SA node) and travels through the atria, causing them to pump blood into the ventricles. During a PAC, the atria beat too early, before they have had time to fill with blood. The heartbeat pauses afterward so the heart can fill with blood for the next beat. Sometimes PAC can be a warning sign of another type of arrhythmia called atrial fibrillation. Atrial fibrillation may allow blood to pool in the atria and form clots. If a clot travels to the brain, it can cause a stroke. What are the causes? The cause of this condition is often unknown. Sometimes, this condition may be caused by heart disease or injury to the heart. What increases the risk? You are more likely to develop this condition if: You have heart disease. You are 50 years of age or older. Episodes may be triggered by: Tobacco, alcohol, or caffeine use. Stimulant drugs. Some medicines or supplements. Stress and anxiety. What are the signs or symptoms? Symptoms of this condition include: The feeling of a pause in the heartbeat. The first heartbeat after the "skipped" beat may feel more forceful. A feeling that your heart is fluttering. A quick feeling of dizziness or faintness. How is this diagnosed? This condition is diagnosed based on: Your medical history or symptoms. A physical exam. Your health care provider may listen to your heart. An ECG (electrocardiogram) to monitor the electrical activity of your heart. An ambulatory cardiac monitor that records your heartbeats for 24 hours or more. You may also have: Blood tests. An echocardiogram, which creates an image of your heart. How is this treated? Treatment depends on how often  your symptoms happen and other risk factors. Treatments may include: Medicines. A catheter ablation procedure to destroy the part of the heart tissue that sends abnormal signals. In many cases, treatment may not be needed. Follow these instructions at home: Lifestyle  Do not use any products that contain nicotine or tobacco. These products include cigarettes, chewing tobacco, and vaping devices, such as e-cigarettes. If you need help quitting, ask your health care provider. Exercise regularly. Ask your health care provider what type of exercise is safe for you. Try to get at least 7-9 hours of sleep each night. Find healthy ways to manage stress. Avoid stressful situations when possible. Alcohol use Do not drink alcohol if: Your health care provider tells you not to drink. You are pregnant, may be pregnant, or are planning to become pregnant. Alcohol triggers your episodes. If you drink alcohol: Limit how much you have to: 0-1 drink a day for women. 0-2 drinks a day for men. Know how much alcohol is in your drink. In the [place] one drink equals one 12 oz bottle of beer (355 mL), one 5 oz glass of wine (148 mL), or one 1 oz glass of hard liquor (44 mL). General instructions Take over-the-counter and prescription medicines only as told by your health care provider. If caffeine triggers episodes, do not eat, drink, or use anything with caffeine in it. Contact a health care provider if: You feel your heart is fluttering. Your heart skips beats and you feel dizzy, light-headed, or very tired. Get help right away if: You have chest pain. You have trouble breathing. You faint. You have any symptoms of   a stroke. "BE FAST" is an easy way to remember the main warning signs of a stroke: B - Balance. Signs are dizziness, sudden trouble walking, or loss of balance. E - Eyes. Signs are trouble seeing or a sudden change in vision. F - Face. Signs are sudden weakness or numbness of the face, or the  face or eyelid drooping on one side. A - Arms. Signs are weakness or numbness in an arm. This happens suddenly and usually on one side of the body. S - Speech. Signs are sudden trouble speaking, slurred speech, or trouble understanding what people say. T - Time. Time to call emergency services. Write down what time symptoms started. You have other signs of stroke, such as: A sudden, severe headache with no known cause. Nausea or vomiting. Seizure. These symptoms may be an emergency. Get help right away. Call 911. Do not wait to see if the symptoms will go away. Do not drive yourself to the hospital. This information is not intended to replace advice given to you by your health care provider. Make sure you discuss any questions you have with your health care provider. Document Revised: 06/05/2021 Document Reviewed: 06/05/2021 Elsevier Patient Education  2023 Elsevier Inc.  

## 2022-04-14 LAB — URINALYSIS, ROUTINE W REFLEX MICROSCOPIC
Bacteria, UA: NONE SEEN /HPF
Bilirubin Urine: NEGATIVE
Glucose, UA: NEGATIVE
Hgb urine dipstick: NEGATIVE
Hyaline Cast: NONE SEEN /LPF
Ketones, ur: NEGATIVE
Nitrite: NEGATIVE
Protein, ur: NEGATIVE
Specific Gravity, Urine: 1.025 (ref 1.001–1.035)
pH: 5.5 (ref 5.0–8.0)

## 2022-04-14 LAB — CBC WITH DIFFERENTIAL/PLATELET
Absolute Monocytes: 539 cells/uL (ref 200–950)
Basophils Absolute: 63 cells/uL (ref 0–200)
Basophils Relative: 0.9 %
Eosinophils Absolute: 140 cells/uL (ref 15–500)
Eosinophils Relative: 2 %
HCT: 37 % (ref 35.0–45.0)
Hemoglobin: 12.3 g/dL (ref 11.7–15.5)
Lymphs Abs: 2352 cells/uL (ref 850–3900)
MCH: 29.4 pg (ref 27.0–33.0)
MCHC: 33.2 g/dL (ref 32.0–36.0)
MCV: 88.3 fL (ref 80.0–100.0)
MPV: 11.6 fL (ref 7.5–12.5)
Monocytes Relative: 7.7 %
Neutro Abs: 3906 cells/uL (ref 1500–7800)
Neutrophils Relative %: 55.8 %
Platelets: 308 10*3/uL (ref 140–400)
RBC: 4.19 10*6/uL (ref 3.80–5.10)
RDW: 11.9 % (ref 11.0–15.0)
Total Lymphocyte: 33.6 %
WBC: 7 10*3/uL (ref 3.8–10.8)

## 2022-04-14 LAB — COMPLETE METABOLIC PANEL WITH GFR
AG Ratio: 1.9 (calc) (ref 1.0–2.5)
ALT: 14 U/L (ref 6–29)
AST: 17 U/L (ref 10–35)
Albumin: 4.8 g/dL (ref 3.6–5.1)
Alkaline phosphatase (APISO): 69 U/L (ref 37–153)
BUN: 19 mg/dL (ref 7–25)
CO2: 29 mmol/L (ref 20–32)
Calcium: 9.5 mg/dL (ref 8.6–10.4)
Chloride: 104 mmol/L (ref 98–110)
Creat: 0.72 mg/dL (ref 0.60–1.00)
Globulin: 2.5 g/dL (calc) (ref 1.9–3.7)
Glucose, Bld: 124 mg/dL — ABNORMAL HIGH (ref 65–99)
Potassium: 4.6 mmol/L (ref 3.5–5.3)
Sodium: 141 mmol/L (ref 135–146)
Total Bilirubin: 0.3 mg/dL (ref 0.2–1.2)
Total Protein: 7.3 g/dL (ref 6.1–8.1)
eGFR: 89 mL/min/{1.73_m2} (ref >=60)

## 2022-04-14 LAB — MAGNESIUM: Magnesium: 2.1 mg/dL (ref 1.5–2.5)

## 2022-04-14 LAB — MICROALBUMIN / CREATININE URINE RATIO
Creatinine, Urine: 112 mg/dL (ref 20–275)
Microalb Creat Ratio: 7 mg/g creat (ref <30)
Microalb, Ur: 0.8 mg/dL

## 2022-04-14 LAB — LIPID PANEL
Cholesterol: 200 mg/dL — ABNORMAL HIGH (ref <200)
HDL: 56 mg/dL (ref >=50)
Non-HDL Cholesterol (Calc): 144 mg/dL (calc) — ABNORMAL HIGH (ref <130)
Total CHOL/HDL Ratio: 3.6 (calc) (ref <5.0)
Triglycerides: 418 mg/dL — ABNORMAL HIGH (ref <150)

## 2022-04-14 LAB — TSH: TSH: 4.65 mIU/L — ABNORMAL HIGH (ref 0.40–4.50)

## 2022-04-14 LAB — VITAMIN D 25 HYDROXY (VIT D DEFICIENCY, FRACTURES): Vit D, 25-Hydroxy: 72 ng/mL (ref 30–100)

## 2022-04-14 LAB — MICROSCOPIC MESSAGE

## 2022-04-14 LAB — HEMOGLOBIN A1C
Hgb A1c MFr Bld: 5.7 % of total Hgb — ABNORMAL HIGH (ref <5.7)
Mean Plasma Glucose: 117 mg/dL
eAG (mmol/L): 6.5 mmol/L

## 2022-04-15 NOTE — Progress Notes (Signed)
Assessment and Plan:  [name] [name] was seen today for follow-up.  Diagnoses and all orders for this visit:  Bilateral swelling of feet She is to push fluids, limit sodium Start HCTZ 25 mg QD  If BP runs less than 100/60 consistently notify the office and will adjust medications -     hydrochlorothiazide (HYDRODIURIL) 25 MG tablet; Take 1 tablet (25 mg total) by mouth daily.  Atrial bigeminy Continue Verapamil and monitor Follow up in June with Dr Melford Aase Go to the ER if any chest pain, shortness of breath, nausea, dizziness, severe HA, changes vision/speech      Further disposition pending results of labs. Discussed med's effects and SE's.   Over 30 minutes of exam, counseling, chart review, and critical decision making was performed.   Future Appointments  Date Time Provider [place]  4/72/2024 10:20 AM [facility] 3 [facility]  07/15/2022  2:30 PM Unk Pinto, MD [facility] None  04/13/2023  2:00 PM Alycia Rossetti, NP [facility] None    ------------------------------------------------------------------------------------------------------------------   HPI BP 122/60   Pulse 71   Temp 97.7 F (36.5 C)   Ht 5\' 8"  (1.727 m)   Wt 148 lb 9.6 oz (67.4 kg)   LMP 11/17/2002   SpO2 97%   BMI 22.59 kg/m   72 y.o.female presents for reevaluation of EKG- visit 1 week ago showed PAC's bigeminy. She was started on verapamil 180 mg QD. She did call Dr. Melford Aase over the weekend complaining of swelling and he stopped her amlodipine.   BP Readings from Last 3 Encounters:  04/19/22 122/60  04/13/22 122/62  12/31/21 128/80     Past Medical History:  Diagnosis Date   Abnormal Pap smear of cervix    Acute lower GI bleeding 02/16/2016   Blood transfusion without reported diagnosis 2018   Heart murmur    Hyperlipidemia    Hypertension    Prediabetes    Seasonal allergies    Thyroiditis 07/22/2017   Vitamin D deficiency      Allergies  Allergen Reactions    Amoxil [Amoxicillin] Rash   Ampicillin Rash    Current Outpatient Medications on File Prior to Visit  Medication Sig   acetaminophen (TYLENOL) 325 MG tablet Take 2 tablets (650 mg total) by mouth every 6 (six) hours as needed for mild pain (or Fever >/= 101).   amLODipine (NORVASC) 5 MG tablet Take  1 tablet  Daily for BP                               /                                 Take                        by                        mouth                     once daily   aspirin EC 81 MG tablet Take 81 mg by mouth every other day.    calcium carbonate (OS-CAL) 600 MG TABS tablet Take 600 mg by mouth daily with breakfast.   Cholecalciferol (VITAMIN D3) 125 MCG (5000 UT) CAPS Take 5,000 Units by  mouth daily.   fish oil-omega-3 fatty acids 1000 MG capsule Take 1 g by mouth 3 (three) times daily with meals.    Flaxseed, Linseed, (FLAXSEED OIL PO) Take 1 capsule by mouth 3 (three) times daily with meals.    ibuprofen (ADVIL) 800 MG tablet TAKE 1 TABLET BY MOUTH EVERY 8 HOURS AS NEEDED   Multiple Vitamins-Minerals (CENTRUM SILVER PO) Take by mouth.   OVER THE COUNTER MEDICATION Takes Allergy meds as needed   verapamil (CALAN-SR) 180 MG CR tablet 1 tablet by mouth daily   No current facility-administered medications on file prior to visit.    ROS: all negative except above.   Physical Exam:  BP 122/60   Pulse 71   Temp 97.7 F (36.5 C)   Ht 5\' 8"  (1.727 m)   Wt 148 lb 9.6 oz (67.4 kg)   LMP 11/17/2002   SpO2 97%   BMI 22.59 kg/m   General Appearance: Well nourished, in no apparent distress. Eyes: PERRLA, EOMs, conjunctiva no swelling or erythema Sinuses: No Frontal/maxillary tenderness ENT/Mouth: Ext aud canals clear, TMs without erythema, bulging. No erythema, swelling, or exudate on post pharynx.  Tonsils not swollen or erythematous. Hearing normal.  Neck: Supple, thyroid normal.  Respiratory: Respiratory effort normal, BS equal bilaterally without rales, rhonchi, wheezing  or stridor.  Cardio: RRR with no MRGs. Brisk peripheral pulses without edema.  Abdomen: Soft, + BS.  Non tender, no guarding, rebound, hernias, masses. Lymphatics: Non tender without lymphadenopathy.  Musculoskeletal: Full ROM, 5/5 strength, normal gait.  Skin: Warm, dry without rashes, lesions, ecchymosis.  Neuro: Cranial nerves intact. Normal muscle tone, no cerebellar symptoms. Sensation intact.  Psych: Awake and oriented X 3, normal affect, Insight and Judgment appropriate.   EKG: NSR, no ST changes  Phyillis Dascoli Mikki Santee, NP 11:50 AM [facility]

## 2022-04-17 ENCOUNTER — Other Ambulatory Visit: Payer: Self-pay | Admitting: Internal Medicine

## 2022-04-19 ENCOUNTER — Ambulatory Visit (INDEPENDENT_AMBULATORY_CARE_PROVIDER_SITE_OTHER): Payer: Medicare HMO | Admitting: Nurse Practitioner

## 2022-04-19 ENCOUNTER — Encounter: Payer: Self-pay | Admitting: Nurse Practitioner

## 2022-04-19 VITALS — BP 122/60 | HR 71 | Temp 97.7°F | Ht 68.0 in | Wt 148.6 lb

## 2022-04-19 DIAGNOSIS — I498 Other specified cardiac arrhythmias: Secondary | ICD-10-CM | POA: Diagnosis not present

## 2022-04-19 DIAGNOSIS — M7989 Other specified soft tissue disorders: Secondary | ICD-10-CM

## 2022-04-19 MED ORDER — HYDROCHLOROTHIAZIDE 25 MG PO TABS: 25.0000 mg | ORAL_TABLET | Freq: Every day | ORAL | 3 refills | Status: DC

## 2022-04-19 NOTE — Addendum Note (Signed)
Addended by: Lorenda Peck E on: 04/19/2022 03:39 PM   Modules accepted: Orders

## 2022-04-19 NOTE — Patient Instructions (Signed)
Monitor BP and if consistently lower than 100/60 notify the office  Hydrochlorothiazide Capsules or Tablets What is this medication? HYDROCHLOROTHIAZIDE (hye droe klor oh THYE a zide) treats high blood pressure. It may also be used to reduce swelling related to heart, kidney, or liver disease. It helps your kidneys remove more fluid and salt from your blood through the urine. It belongs to a group of medications called diuretics. This medicine may be used for other purposes; ask your health care provider or pharmacist if you have questions. COMMON BRAND NAME(S): Esidrix, Ezide, HydroDIURIL, Microzide, Oretic, Zide What should I tell my care team before I take this medication? They need to know if you have any of these conditions: Diabetes Gout Kidney disease Liver disease Lupus Pancreatitis An unusual or allergic reaction to hydrochlorothiazide, other medications, foods, dyes, or preservatives Pregnant or trying to get pregnant Breastfeeding How should I use this medication? Take this medication by mouth. Take it as directed on the prescription label at the same time every day. You can take it with or without food. If it upsets your stomach, take it with food. Keep taking it unless your care team tells you to stop. Talk to your care team about the use of this medication in children. While it may be prescribed for children as young as newborns for selected conditions, precautions do apply. Overdosage: If you think you have taken too much of this medicine contact a poison control center or emergency room at once. NOTE: This medicine is only for you. Do not share this medicine with others. What if I miss a dose? If you miss a dose, take it as soon as you can. If it is almost time for your next dose, take only that dose. Do not take double or extra doses. What may interact with this medication? Cholestyramine Colestipol Digoxin Dofetilide Lithium Medications for blood pressure Medications  for diabetes Medications that relax muscles for surgery Other diuretics Steroid medications, such as prednisone or cortisone This list may not describe all possible interactions. Give your health care provider a list of all the medicines, herbs, non-prescription drugs, or dietary supplements you use. Also tell them if you smoke, drink alcohol, or use illegal drugs. Some items may interact with your medicine. What should I watch for while using this medication? Visit your care team for regular checks on your progress. Check your blood pressure as directed. Know what your blood pressure should be and when to contact your care team. Do not treat yourself for coughs, colds, or pain while you are using this medication without asking your care team for advice. Some medications may increase your blood pressure. This medication may affect your coordination, reaction time, or judgment. Do not drive or operate machinery until you know how this medication affects you. Sit up or stand slowly to reduce the risk of dizzy or fainting spells. Drinking alcohol with this medication can increase the risk of these side effects. Talk to your care team about your risk of skin cancer. You may be more at risk for skin cancer if you take this medication. This medication can make you more sensitive to the sun. Keep out of the sun. If you cannot avoid being in the sun, wear protective clothing and use sunscreen. Do not use sun lamps or tanning beds/booths. You may need to be on a special diet while taking this medication. Ask your care team. Also, find out how many glasses of fluids you need to drink each day. Check  with your care team if you get an attack of severe diarrhea, nausea and vomiting, or if you sweat a lot. The loss of too much body fluid can make it dangerous for you to take this medication. This medication may increase blood sugar. Ask your care team if changes in diet or medications are needed if you have  diabetes. What side effects may I notice from receiving this medication? Side effects that you should report to your care team as soon as possible: Allergic reactions--skin rash, itching, hives, swelling of the face, lips, tongue, or throat Dehydration--increased thirst, dry mouth, feeling faint or lightheaded, headache, dark yellow or brown urine Gout--severe pain, redness, warmth, or swelling in joints, such as the big toe Kidney injury--decrease in the amount of urine, swelling of the ankles, hands, or feet Low blood pressure--dizziness, feeling faint or lightheaded, blurry vision Low potassium level--muscle pain or cramps, unusual weakness, fatigue, fast or irregular heartbeat, constipation Sudden eye pain or change in vision such as blurred vision, seeing halos around lights, vision loss Side effects that usually do not require medical attention (report to your care team if they continue or are bothersome): Change in sex drive or performance Headache Upset stomach This list may not describe all possible side effects. Call your doctor for medical advice about side effects. You may report side effects to FDA at 1-800-FDA-1088. Where should I keep my medication? Keep out of the reach of children and pets. Store at room temperature between 20 and 25 degrees C (68 and 77 degrees F). Protect from light and moisture. Keep the container tightly closed. Do not freeze. Get rid of any unused medication after the expiration date. To get rid of medications that are no longer needed or have expired: Take the medication to a medication take-back program. Check with your pharmacy or law enforcement to find a location. If you cannot return the medication, check the label or package insert to see if the medication should be thrown out in the garbage or flushed down the toilet. If you are not sure, ask your care team. If it is safe to put in the trash, empty the medication out of the container. Mix the  medication with cat litter, dirt, coffee grounds, or other unwanted substance. Seal the mixture in a bag or container. Put it in the trash. NOTE: This sheet is a summary. It may not cover all possible information. If you have questions about this medicine, talk to your doctor, pharmacist, or health care provider.  2023 Elsevier/Gold Standard (2020-09-23 00:00:00)

## 2022-04-21 DIAGNOSIS — R69 Illness, unspecified: Secondary | ICD-10-CM | POA: Diagnosis not present

## 2022-05-14 ENCOUNTER — Ambulatory Visit: Payer: Medicare HMO

## 2022-05-21 ENCOUNTER — Ambulatory Visit
Admission: RE | Admit: 2022-05-21 | Discharge: 2022-05-21 | Disposition: A | Discharge status: Home / Self Care | Payer: Medicare HMO | Source: Ambulatory Visit | Attending: Internal Medicine | Admitting: Internal Medicine

## 2022-05-21 DIAGNOSIS — Z1231 Encounter for screening mammogram for malignant neoplasm of breast: Secondary | ICD-10-CM

## 2022-05-30 IMAGING — MG MM DIGITAL SCREENING BILAT W/ TOMO AND CAD
8 series · 8 of 24 positions shown · non-contrast
Comparison: Previous exam(s).

CLINICAL DATA: Screening.

EXAM:
DIGITAL SCREENING BILATERAL MAMMOGRAM WITH TOMOSYNTHESIS AND CAD
TECHNIQUE: Bilateral screening digital craniocaudal and mediolateral oblique
mammograms were obtained. Bilateral screening digital breast
tomosynthesis was performed. The images were evaluated with
computer-aided detection.

[R CC synth-2D]
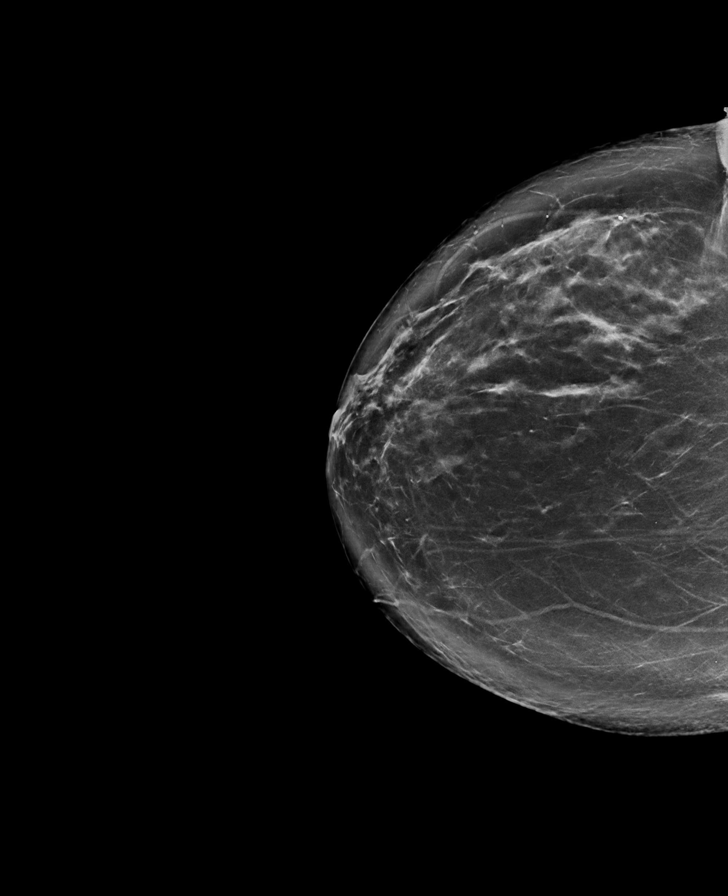

[R MLO synth-2D]
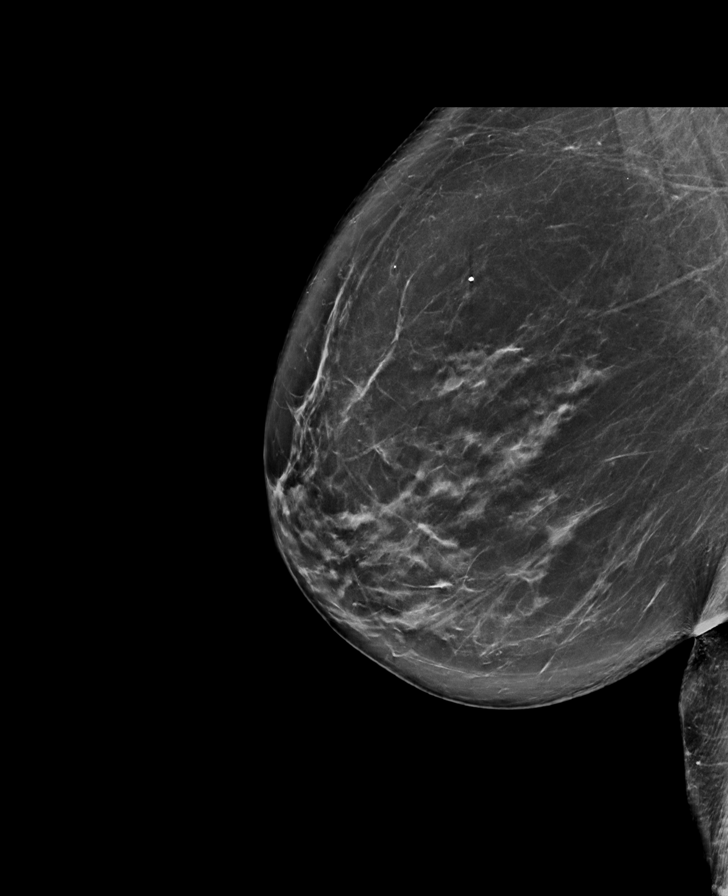

[L MLO synth-2D]
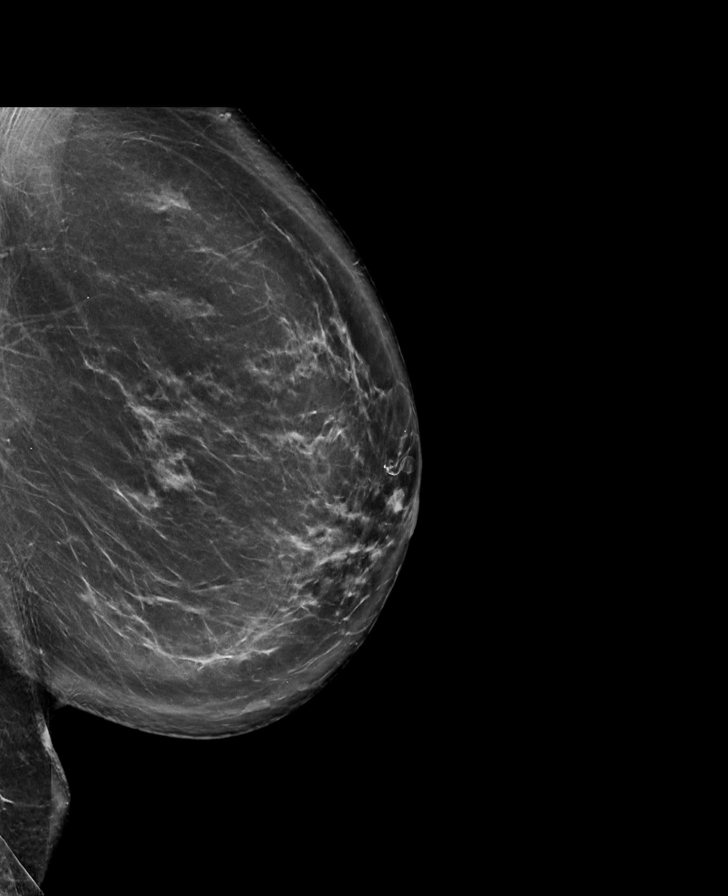

[L CC synth-2D]
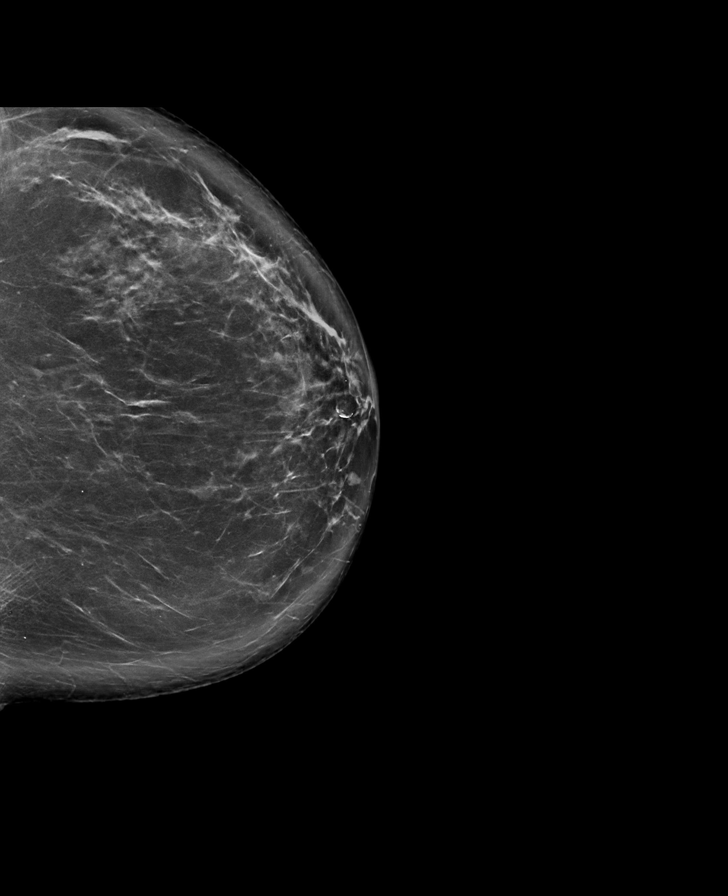

[L MLO tomo · tomo slice 49/97.0]
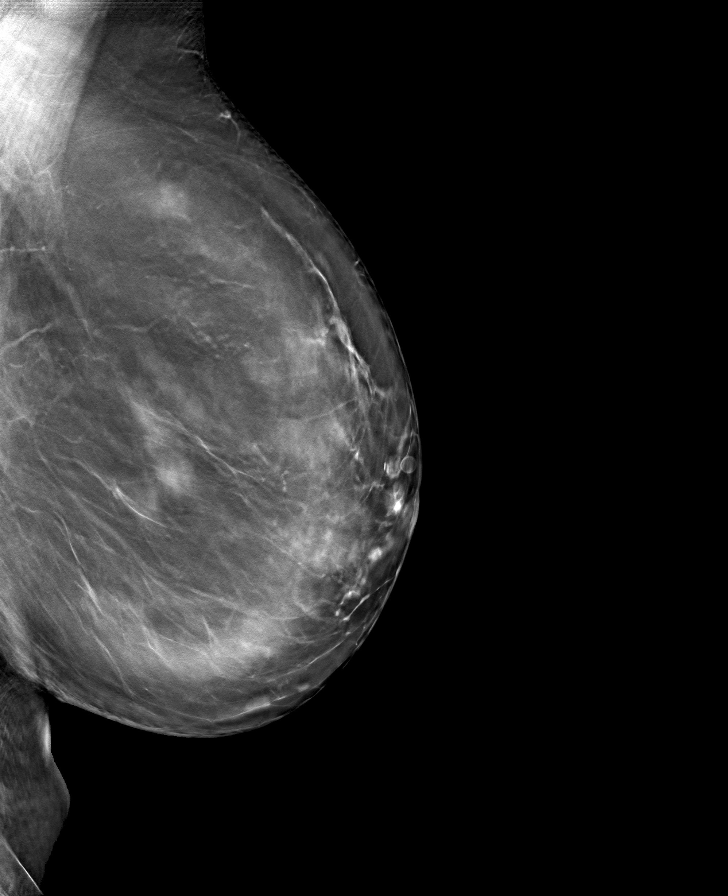

[R MLO tomo · tomo slice 47/93.0]
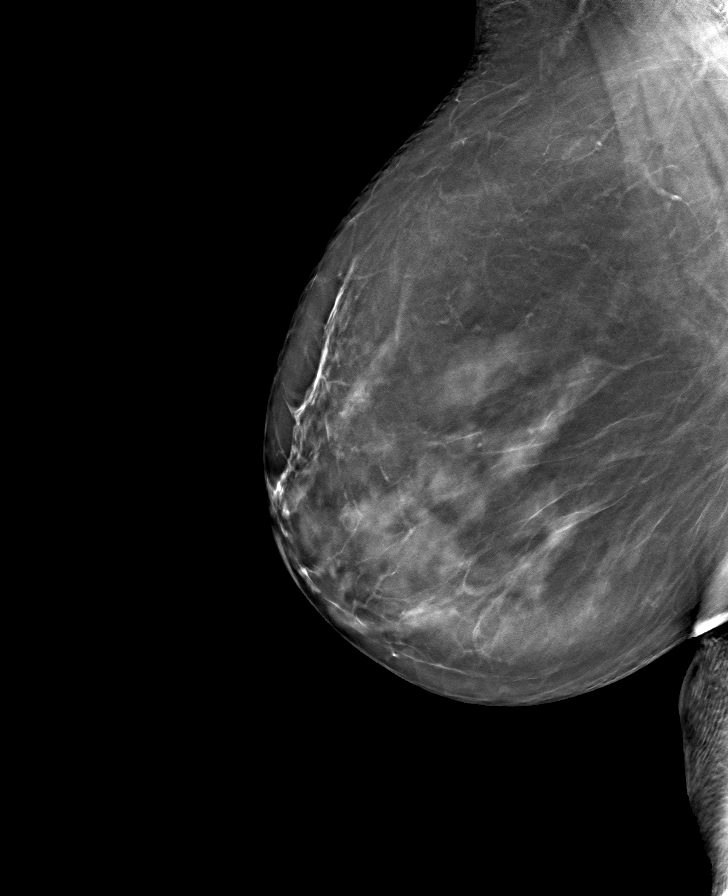

[R CC tomo · tomo slice 48/95.0]
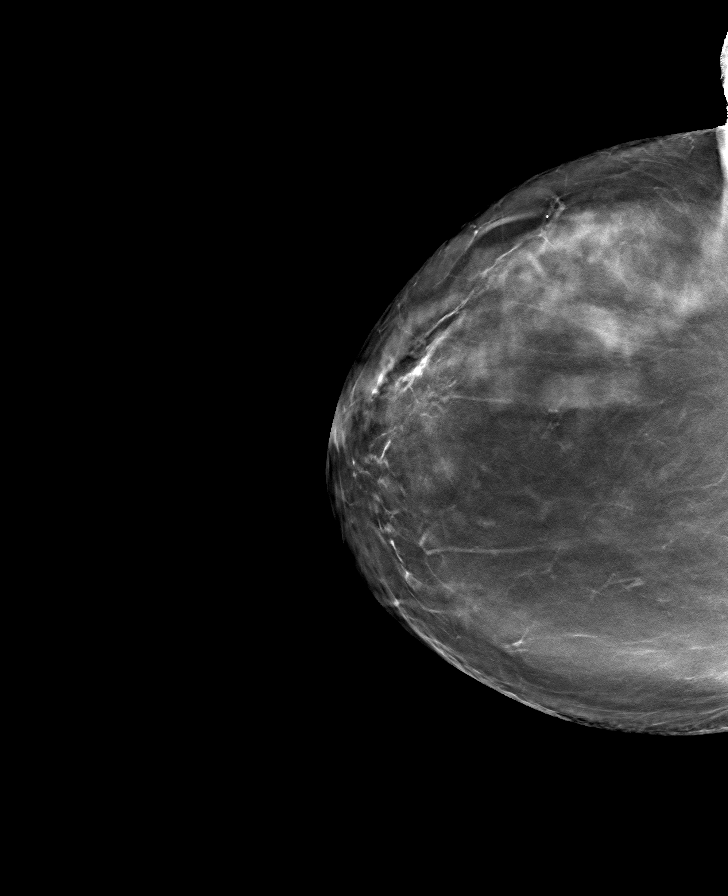

[L CC tomo · tomo slice 47/93.0]
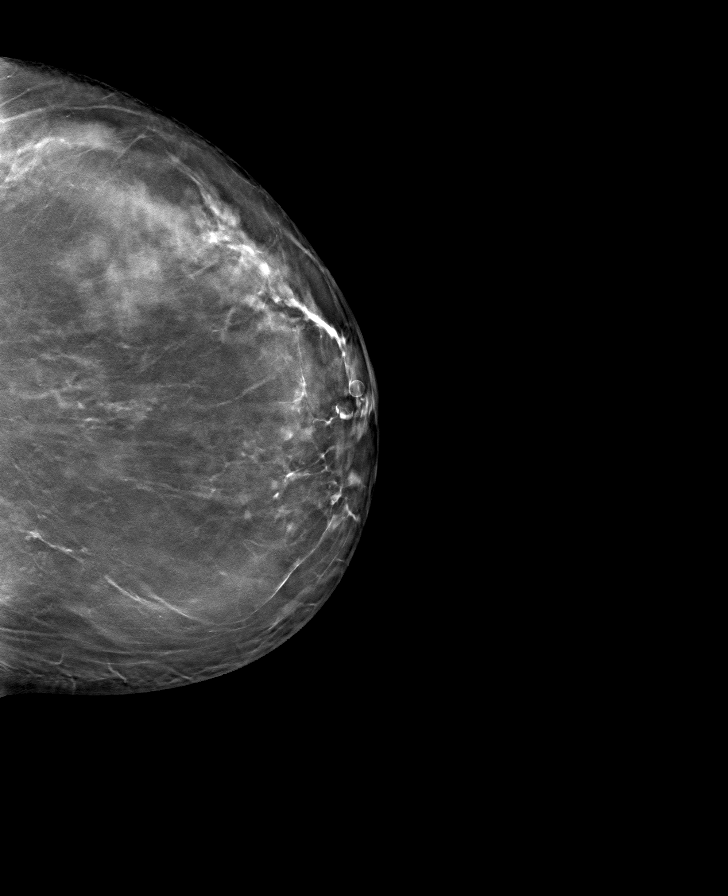

[8 of 24 positions shown; findings below may reference images not displayed]

ACR Breast Density Category b: There are scattered areas of
fibroglandular density.
FINDINGS: There are no findings suspicious for malignancy.
IMPRESSION: No mammographic evidence of malignancy. A result letter of this
screening mammogram will be mailed directly to the patient.

RECOMMENDATION:
Screening mammogram in one year. (Code:51-O-LD2)

BI-RADS CATEGORY  1: Negative.

## 2022-06-18 DIAGNOSIS — R079 Chest pain, unspecified: Secondary | ICD-10-CM | POA: Diagnosis not present

## 2022-06-18 DIAGNOSIS — R9431 Abnormal electrocardiogram [ECG] [EKG]: Secondary | ICD-10-CM | POA: Diagnosis not present

## 2022-06-18 DIAGNOSIS — R0789 Other chest pain: Secondary | ICD-10-CM | POA: Diagnosis not present

## 2022-06-18 DIAGNOSIS — I700 Atherosclerosis of aorta: Secondary | ICD-10-CM | POA: Diagnosis not present

## 2022-06-22 ENCOUNTER — Telehealth: Payer: Self-pay | Admitting: *Deleted

## 2022-06-22 NOTE — Telephone Encounter (Signed)
ED Discharge  [name] [name],[name] [name] 71 years, Female  DOB: [birth year]  M: [phone]  __________________________________________________ ED Details Emergency Room Discharge Details ED Admit Date:: 06/18/2022 ED Discharge Date:: 06/18/2022 ICD Code/Description:: R07 : Chest pain, unspecified Treatment Location:: [facility]  Call Outreach Attempt #1 Date:: 06/21/2022 Time: PM Outcome:: Unsuccessful - Left message Details: [facility]  Attempt #2 Date:: 06/22/2022 Time: AM Outcome:: Successful  Patient Details  What led you to go to the Emergency Room?: Visiting daughter in [place] and on Friday she had sharp right side chest pain that kept happening periodically. Was it trauma event or true emergency? (Chest pain, loss of consciousness, stroke, motor vehicle accident, fracture, fall, confusion, etc.): Yes Was it recommended that you follow up with your health care provider or another provider?: Yes Do you already have a follow up with your Provider or specialist?: Yes Do you feel comfortable and do you understand the instructions you received from the Emergency Room on warning signs or symptoms that would indicate a need to call the doctor(s), or be seen before your next appointment?: Yes What would you do if you had any of the warning signs we just discussed and unable to reach your PCP?: Call Clinical Lead Med review completed - Med list from Emergency Room discharge reviewed with patient and changes updated in clinic EMR: Yes Were any of the following done while you were in the Emergency Room?: X-ray, Labs Do you have someone at home to help you since you've been discharged from the hospital/ER/Urgent Care?: Yes Who/Relationship: Genevie Cheshire, husband Do you have any barriers to receiving the healthcare recommended for you?: Other Details: none Are there any questions or concerns you have regarding your Emergency Room visit?: Patient is scheduled to see PCP on 07/15/22 and wants  to discuss a referral to cardiology. Patient originally went to urgent care first and the wait was so long and because she was having chest pain, they instructed her to go to ED. Patient states that nothing was found wrong at the ED and no medication changes. Since the visit patient has only had 1 episode of the chest pain and has been fine since then. Is there a longitudinal CRN on the team that has seen the patient since being enrolled?: No  Engagement Notes [name] [name] on 06/22/2022 11:40 AM Completed call with patient and patient is doing much better. Patient has a follow up scheduled with Dr. Oneta Rack on 07/15/22. ( )  [name] [name] on 06/21/2022 12:23 PM Reviewing patients chart prior to ED discharge call. No records available in EMR. [facility]. ( )  Clinical Lead Review CP/CRN Follow Up ED discharge protocol reviewed and: Case reviewed. Appropriate follow up arranged. See EMR for details.  CRN review of call: 5 min

## 2022-06-23 ENCOUNTER — Telehealth: Payer: Self-pay

## 2022-06-23 NOTE — Telephone Encounter (Signed)
Annabelle Harman wanted patient to come in and be seen for possible change in her medications after getting a message about hair loss. Annabelle Harman wanted to listen to her heart and talk about changes. Sent patient to Irving Burton to schedule and then she refused to schedule because she was seeing Dr. Oneta Rack 07/15/22. Patient said she would talk to him about it at that appointment.

## 2022-07-15 ENCOUNTER — Encounter: Payer: Self-pay | Admitting: Internal Medicine

## 2022-07-15 ENCOUNTER — Ambulatory Visit (INDEPENDENT_AMBULATORY_CARE_PROVIDER_SITE_OTHER): Payer: Medicare HMO | Admitting: Internal Medicine

## 2022-07-15 ENCOUNTER — Ambulatory Visit: Payer: Medicare HMO | Admitting: Nurse Practitioner

## 2022-07-15 VITALS — BP 138/78 | HR 68 | Temp 97.8°F | Resp 17 | Ht 68.0 in | Wt 143.8 lb

## 2022-07-15 DIAGNOSIS — Z79899 Other long term (current) drug therapy: Secondary | ICD-10-CM | POA: Diagnosis not present

## 2022-07-15 DIAGNOSIS — R7309 Other abnormal glucose: Secondary | ICD-10-CM | POA: Diagnosis not present

## 2022-07-15 DIAGNOSIS — I10 Essential (primary) hypertension: Secondary | ICD-10-CM | POA: Diagnosis not present

## 2022-07-15 DIAGNOSIS — E782 Mixed hyperlipidemia: Secondary | ICD-10-CM | POA: Diagnosis not present

## 2022-07-15 DIAGNOSIS — E559 Vitamin D deficiency, unspecified: Secondary | ICD-10-CM

## 2022-07-15 DIAGNOSIS — L659 Nonscarring hair loss, unspecified: Secondary | ICD-10-CM | POA: Diagnosis not present

## 2022-07-15 LAB — CBC WITH DIFFERENTIAL/PLATELET
Absolute Monocytes: 446 cells/uL (ref 200–950)
Lymphs Abs: 1810 cells/uL (ref 850–3900)
MCV: 87.1 fL (ref 80.0–100.0)
MPV: 10.8 fL (ref 7.5–12.5)
Neutro Abs: 3113 cells/uL (ref 1500–7800)
Platelets: 314 10*3/uL (ref 140–400)
RDW: 14 % (ref 11.0–15.0)

## 2022-07-15 MED ORDER — AMLODIPINE BESYLATE 2.5 MG PO TABS
ORAL_TABLET | ORAL | 3 refills | Status: DC
Start: 2022-07-15 — End: 2022-12-15

## 2022-07-15 MED ORDER — ZINC 50 MG PO TABS
ORAL_TABLET | ORAL | 0 refills | Status: AC
Start: 2022-07-15 — End: ?

## 2022-07-15 NOTE — Progress Notes (Signed)
Future Appointments  Date Time Provider Department  07/15/2022                          3 mo ov   2:30 PM Lucky Cowboy, MD [facility]  04/13/2023                          cpe   2:00 PM Raynelle Dick, NP [facility]    History of Present Illness:       This very nice 72 y.o. MWF presents for 3 month follow up with HTN, HLD, Pre-Diabetes and Vitamin D Deficiency.   Patient was dx'd with Thyroiditis in May-July 2019 tx'd w/Prednisone - tapered to d/c in Sept 2019.         Patient is treated for HTN  since   2001 & BP has been controlled at home. Today's  . Patient has had no complaints of any cardiac type chest pain, palpitations, dyspnea Pollyann Kennedy /PND, dizziness, claudication or dependent edema.        Hyperlipidemia is not controlled with diet & supplements . Last Lipids were  Lab Results  Component Value Date   CHOL 200 (H) 04/13/2022   HDL 56 04/13/2022   LDLCALC  not calculated. 04/13/2022   TRIG 418 (H) 04/13/2022   CHOLHDL 3.6 04/13/2022     Also, the patient has history of  PreDiabetes (A1c 6.0% /2014  & 5.9% /2017)  and has had no symptoms of reactive hypoglycemia, diabetic polys, paresthesias or visual blurring.  Last A1c was   Lab Results  Component Value Date   HGBA1C 5.7 (H) 04/13/2022                                                          Further, the patient also has history of Vitamin D Deficiency ("24" /2008)  and supplements vitamin D . Last vitamin D was at goal :  Lab Results  Component Value Date   VD25OH 72 04/13/2022     Current Outpatient Medications  Medication Instructions   acetaminophen   650 mg Every 6 hours PRN   aspirin EC   81 mg Every other day   calcium carbonate (OS-CAL)  600 mg Daily with breakfast   fish oil-omega-3 fatty acids   1  g 3 times daily with meals   Flaxseed, Linseed, (FLAXSEED OIL PO) 1 capsule 3 times daily with meals   Hydrochlorothiazide   25 mg Daily   ibuprofen (ADVIL)  800 mg Every 8 hours PRN    CENTRUM SILVER  daily   OTC Allergy meds Takes as needed    verapamil SR 180 MG CR tablet 1 tablet daily   Vitamin D  5,000 Units Daily    Allergies  Allergen Reactions   Amoxil [Amoxicillin] Rash   Ampicillin Rash     PMHx:   Past Medical History:  Diagnosis Date   Abnormal Pap smear of cervix    Acute lower GI bleeding 02/16/2016   Blood transfusion without reported diagnosis 2018   Heart murmur    Hyperlipidemia    Hypertension    Prediabetes    Seasonal allergies    Thyroiditis 07/22/2017   Vitamin D deficiency      Immunization  History  Administered Date(s) Administered   Influenza Inj Mdck Quad  01/03/2020   Influenza Split 01/03/2013   Influenza, High Dose  11/25/2017, 10/04/2018, 11/06/2020, 10/29/2021   PPD Test 07/13/2013   Pneumococcal -13 01/08/2016   Pneumococcal -23 12/19/2017   Pneumococcal-23 01/18/2002   Tdap 01/19/2003, 07/05/2012   Zoster, Live 07/05/2012     Past Surgical History:  Procedure Laterality Date   CARDIAC SURGERY  1958   at age 48 for hole in heart   CERVIX LESION DESTRUCTION N/A ? years ago   with abnormal pap   COLONOSCOPY     polyps   COLONOSCOPY N/A 02/16/2016   Procedure: COLONOSCOPY;  Surgeon: Meryl Dare, MD;  Location: [facility];  Service: Endoscopy;  Laterality: N/A;   CRYOTHERAPY     for abnormal pap smear   TUBAL LIGATION       FHx:    Reviewed / unchanged   SHx:    Reviewed / unchanged    Systems Review:  Constitutional: Denies fever, chills, wt changes, headaches, insomnia, fatigue, night sweats, change in appetite. Eyes: Denies redness, blurred vision, diplopia, discharge, itchy, watery eyes.  ENT: Denies discharge, congestion, post nasal drip, epistaxis, sore throat, earache, hearing loss, dental pain, tinnitus, vertigo, sinus pain, snoring.  CV: Denies chest pain, palpitations, irregular heartbeat, syncope, dyspnea, diaphoresis, orthopnea, PND, claudication or edema. Respiratory: denies cough,  dyspnea, DOE, pleurisy, hoarseness, laryngitis, wheezing.  Gastrointestinal: Denies dysphagia, odynophagia, heartburn, reflux, water brash, abdominal pain or cramps, nausea, vomiting, bloating, diarrhea, constipation, hematemesis, melena, hematochezia  or hemorrhoids. Genitourinary: Denies dysuria, frequency, urgency, nocturia, hesitancy, discharge, hematuria or flank pain. Musculoskeletal: Denies arthralgias, myalgias, stiffness, jt. swelling, pain, limping or strain/sprain.  Skin: Denies pruritus, rash, hives, warts, acne, eczema or change in skin lesion(s). Neuro: No weakness, tremor, incoordination, spasms, paresthesia or pain. Psychiatric: Denies confusion, memory loss or sensory loss. Endo: Denies change in weight, skin or hair change.  Heme/Lymph: No excessive bleeding, bruising or enlarged lymph nodes.   Physical Exam  LMP 11/17/2002   Appears  well nourished, well groomed  and in no distress.  Eyes: PERRLA, EOMs, conjunctiva no swelling or erythema. Sinuses: No frontal/maxillary tenderness ENT/Mouth: EAC's clear, TM's nl w/o erythema, bulging. Nares clear w/o erythema, swelling, exudates. Oropharynx clear without erythema or exudates. Oral hygiene is good. Tongue normal, non obstructing. Hearing intact.  Neck: Supple. Thyroid not palpable. Car 2+/2+ without bruits, nodes or JVD. Chest: Respirations nl with BS clear & equal w/o rales, rhonchi, wheezing or stridor.  Cor: Heart sounds normal w/ regular rate and rhythm without sig. murmurs, gallops, clicks or rubs. Peripheral pulses normal and equal  without edema.  Abdomen: Soft & bowel sounds normal. Non-tender w/o guarding, rebound, hernias, masses or organomegaly.  Lymphatics: Unremarkable.  Musculoskeletal: Full ROM all peripheral extremities, joint stability, 5/5 strength and normal gait.  Skin: Warm, dry without exposed rashes, lesions or ecchymosis apparent.  Neuro: Cranial nerves intact, reflexes equal bilaterally.  Sensory-motor testing grossly intact. Tendon reflexes grossly intact.  Pysch: Alert & oriented x 3.  Insight and judgement nl & appropriate. No ideations.   Assessment and Plan:   1. Essential hypertension  - Continue medication, monitor blood pressure at home.  - Continue DASH diet.  Reminder to go to the ER if any CP,  SOB, nausea, dizziness, severe HA, changes vision/speech.    - CBC with Differential/Platelet - COMPLETE METABOLIC PANEL WITH GFR - Magnesium - TSH   2. Hyperlipidemia, mixed  - Continue diet/meds,  exercise,& lifestyle modifications.  - Continue monitor periodic cholesterol/liver & renal functions     - Lipid panel - TSH   3. Abnormal glucose  - Continue diet, exercise  - Lifestyle modifications.  - Monitor appropriate labs   - Hemoglobin A1c - Insulin, random   4. Vitamin D deficiency  - Continue supplementation.    - VITAMIN D 25 Hydroxy   5. Hair loss  - Iron, TIBC and Ferritin Panel - Zinc   6. Medication management  - CBC with Differential/Platelet - COMPLETE METABOLIC PANEL WITH GFR - Magnesium - Lipid panel - TSH - Hemoglobin A1c - Insulin, random - VITAMIN D 25 Hydroxy  - Iron, TIBC and Ferritin Panel - Zinc           Discussed  regular exercise, BP monitoring, weight control to achieve/maintain BMI less than 25 and discussed med and SE's. Recommended labs to assess /monitor clinical status .  I discussed the assessment and treatment plan with the patient. The patient was provided an opportunity to ask questions and all were answered. The patient agreed with the plan and demonstrated an understanding of the instructions.  I provided over 30 minutes of exam, counseling, chart review and  complex critical decision making.        The patient was advised to call back or seek an in-person evaluation if the symptoms worsen or if the condition fails to improve as anticipated.   Marinus Maw, MD

## 2022-07-15 NOTE — Patient Instructions (Signed)

## 2022-07-16 LAB — CBC WITH DIFFERENTIAL/PLATELET
Basophils Absolute: 61 cells/uL (ref 0–200)
Basophils Relative: 1.1 %
Eosinophils Absolute: 72 cells/uL (ref 15–500)
Eosinophils Relative: 1.3 %
Hemoglobin: 12.2 g/dL (ref 11.7–15.5)
MCH: 28.7 pg (ref 27.0–33.0)
Monocytes Relative: 8.1 %
RBC: 4.25 10*6/uL (ref 3.80–5.10)
WBC: 5.5 10*3/uL (ref 3.8–10.8)

## 2022-07-16 LAB — LIPID PANEL
Cholesterol: 192 mg/dL (ref <200)
HDL: 67 mg/dL (ref >=50)
LDL Cholesterol (Calc): 99 mg/dL (calc)
Total CHOL/HDL Ratio: 2.9 (calc) (ref <5.0)

## 2022-07-16 LAB — COMPLETE METABOLIC PANEL WITH GFR
ALT: 14 U/L (ref 6–29)
AST: 21 U/L (ref 10–35)
BUN: 19 mg/dL (ref 7–25)
Creat: 0.68 mg/dL (ref 0.60–1.00)
eGFR: 93 mL/min/{1.73_m2} (ref >=60)

## 2022-07-16 LAB — HEMOGLOBIN A1C: eAG (mmol/L): 7 mmol/L

## 2022-07-16 LAB — VITAMIN D 25 HYDROXY (VIT D DEFICIENCY, FRACTURES): Vit D, 25-Hydroxy: 77 ng/mL (ref 30–100)

## 2022-07-16 LAB — IRON,TIBC AND FERRITIN PANEL: %SAT: 19 % (calc) (ref 16–45)

## 2022-07-16 NOTE — Progress Notes (Signed)
^<^<^<^<^<^<^<^<^<^<^<^<^<^<^<^<^<^<^<^<^<^<^<^<^<^<^<^<^<^<^<^<^<^<^<^<^ ^>^>^>^>^>^>^>^>^>^>^>>^>^>^>^>^>^>^>^>^>^>^>^>^>^>^>^>^>^>^>^>^>^>^>^>^>  -Test results slightly outside the reference range are not unusual. If there is anything important, I will review this with you,  otherwise it is considered normal test values.  If you have further questions,  please do not hesitate to contact me at the office or via My Chart.   ^<^<^<^<^<^<^<^<^<^<^<^<^<^<^<^<^<^<^<^<^<^<^<^<^<^<^<^<^<^<^<^<^<^<^<^<^ ^>^>^>^>^>^>^>^>^>^>^>^>^>^>^>^>^>^>^>^>^>^>^>^>^>^>^>^>^>^>^>^>^>^>^>^>^  -  Chol  = 192   &   LDL =  99  - both  great !  - Very low risk for Heart Attack  / Stroke  ^>^>^>^>^>^>^>^>^>^>^>^>^>^>^>^>^>^>^>^>^>^>^>^>^>^>^>^>^>^>^>^>^>^>^>^>^  - A1c = 6.0% - is elevated 12 week average blood glucose   Blood sugar and A1c are elevated in the borderline and                                                         early or pre-diabetes range which has the same   300% increased risk for heart attack, stroke, cancer and                                            alzheimer- type vascular dementia as full blown diabetes.   But the good news is that diet, exercise with                                                     weight loss can cure the early diabetes at this point.   -  It is very important that you work harder with diet by  avoiding all foods that are white except chicken,   fish & calliflower.  - Avoid white rice  (brown & wild rice is OK),   - Avoid white potatoes  (sweet potatoes in moderation is OK),   White bread or wheat bread or anything made out of   white flour like bagels, donuts, rolls, buns, biscuits, cakes,  - pastries, cookies, pizza crust, and pasta (made from  white flour & egg whites)   - vegetarian pasta or spinach or wheat pasta is OK.  - Multigrain breads like Arnold's, Pepper[employer] or   multigrain sandwich thins or high fiber breads like   [place]  bread or "Dave[employer] Killer" breads that are  4 to 5 grams fiber per slice !  are best.    Diet, exercise and weight loss can reverse and cure  diabetes in the early stages.    - Diet, exercise and weight loss is very important in the   control and prevention of complications of diabetes which  affects every system in your body  ^>^>^>^>^>^>^>^>^>^>^>^>^>^>^>^>^>^>^>^>^>^>^>^>^>^>^>^>^>^>^>^>^>^>^>^>^  -  Ferritin is the iron stores in body and is low & could account for hair thinning.   - Zinc level is still pending  ^>^>^>^>^>^>^>^>^>^>^>^>^>^>^>^>^>^>^>^>^>^>^>^>^>^>^>^>^>^>^>^>^>^>^>^>^  - Thyroid is normal  ^>^>^>^>^>^>^>^>^>^>^>^>^>^>^>^>^>^>^>^>^>^>^>^>^>^>^>^>^>^>^>^>^>^>^>^>^  - Vitamin D = 77 - Excellent - Please continue dosage same  ^>^>^>^>^>^>^>^>^>^>^>^>^>^>^>^>^>^>^>^>^>^>^>^>^>^>^>^>^>^>^>^>^>^>^>^>^ ^>^>^>^>^>^>^>^>^>^>^>^>^>^>^>^>^>^>^>^>^>^>^>^>^>^>^>^>^>^>^>^>^>^>^>^>^  - All Else - CBC - Kidneys - Electrolytes - Liver - Magnesium & Thyroid    - all  Normal / OK  ^>^>^>^>^>^>^>^>^>^>^>^>^>^>^>^>^>^>^>^>^>^>^>^>^>^>^>^>^>^>^>^>^>^>^>^>^ ^>^>^>^>^>^>^>^>^>^>^>^>^>^>^>^>^>^>^>^>^>^>^>^>^>^>^>^>^>^>^>^>^>^>^>^>^

## 2022-07-17 ENCOUNTER — Encounter: Payer: Self-pay | Admitting: Internal Medicine

## 2022-07-20 LAB — HEMOGLOBIN A1C
Hgb A1c MFr Bld: 6 % of total Hgb — ABNORMAL HIGH (ref <5.7)
Mean Plasma Glucose: 126 mg/dL

## 2022-07-20 LAB — COMPLETE METABOLIC PANEL WITH GFR
AG Ratio: 2 (calc) (ref 1.0–2.5)
Albumin: 5 g/dL (ref 3.6–5.1)
Alkaline phosphatase (APISO): 57 U/L (ref 37–153)
CO2: 30 mmol/L (ref 20–32)
Calcium: 10.1 mg/dL (ref 8.6–10.4)
Chloride: 98 mmol/L (ref 98–110)
Globulin: 2.5 g/dL (calc) (ref 1.9–3.7)
Glucose, Bld: 90 mg/dL (ref 65–99)
Potassium: 4.4 mmol/L (ref 3.5–5.3)
Sodium: 140 mmol/L (ref 135–146)
Total Bilirubin: 0.5 mg/dL (ref 0.2–1.2)
Total Protein: 7.5 g/dL (ref 6.1–8.1)

## 2022-07-20 LAB — MAGNESIUM: Magnesium: 2 mg/dL (ref 1.5–2.5)

## 2022-07-20 LAB — TSH: TSH: 4.34 mIU/L (ref 0.40–4.50)

## 2022-07-20 LAB — CBC WITH DIFFERENTIAL/PLATELET
HCT: 37 % (ref 35.0–45.0)
MCHC: 33 g/dL (ref 32.0–36.0)
Neutrophils Relative %: 56.6 %
Total Lymphocyte: 32.9 %

## 2022-07-20 LAB — IRON,TIBC AND FERRITIN PANEL
Ferritin: 15 ng/mL — ABNORMAL LOW (ref 16–288)
Iron: 75 ug/dL (ref 45–160)
TIBC: 400 mcg/dL (calc) (ref 250–450)

## 2022-07-20 LAB — ZINC: Zinc: 71 ug/dL (ref 60–130)

## 2022-07-20 LAB — INSULIN, RANDOM: Insulin: 10.2 u[IU]/mL

## 2022-07-20 LAB — LIPID PANEL
Non-HDL Cholesterol (Calc): 125 mg/dL (calc) (ref <130)
Triglycerides: 163 mg/dL — ABNORMAL HIGH (ref <150)

## 2022-07-20 NOTE — Progress Notes (Signed)
^<^<^<^<^<^<^<^<^<^<^<^<^<^<^<^<^<^<^<^<^<^<^<^<^<^<^<^<^<^<^<^<^<^<^<^<^ ^>^>^>^>^>^>^>^>^>^>^>^>^>^>^>^>^>^>^>^>^>^>^>^>^>^>^>^>^>^>^>^>^>^>^>^>^  -     Zinc level finally returned & is Normal - Great !   ^<^<^<^<^<^<^<^<^<^<^<^<^<^<^<^<^<^<^<^<^<^<^<^<^<^<^<^<^<^<^<^<^<^<^<^<^ ^>^>^>^>^>^>^>^>^>^>^>^>^>^>^>^>^>^>^>^>^>^>^>^>^>^>^>^>^>^>^>^>^>^>^>^>^

## 2022-08-19 ENCOUNTER — Other Ambulatory Visit: Payer: Self-pay | Admitting: Internal Medicine

## 2022-08-19 ENCOUNTER — Other Ambulatory Visit: Payer: Self-pay

## 2022-08-19 DIAGNOSIS — M7989 Other specified soft tissue disorders: Secondary | ICD-10-CM

## 2022-08-19 MED ORDER — HYDROCHLOROTHIAZIDE 25 MG PO TABS
25.0000 mg | ORAL_TABLET | Freq: Every day | ORAL | 3 refills | Status: DC
Start: 2022-08-19 — End: 2022-08-19

## 2022-08-19 MED ORDER — HYDROCHLOROTHIAZIDE 25 MG PO TABS
ORAL_TABLET | ORAL | 3 refills | Status: DC
Start: 2022-08-19 — End: 2023-05-03

## 2022-10-07 NOTE — Progress Notes (Signed)
Assessment and Plan:  [name] [name] was seen today for joint swelling.  Diagnoses and all orders for this visit:  Essential hypertension - continue medications- Norvasc 2.5 mg every day and HCTZ 25 mg every day , DASH diet, exercise and monitor at home. Call if greater than 130/80.   Flu vaccine need -     Flu vaccine HIGH DOSE PF  Right ankle swelling Avoid wearing the low rise socks all together Monitor swelling without socks If Xray is normal and continues to have swelling will refer to ortho -     DG Ankle Complete Right; Future       Further disposition pending results of labs. Discussed med's effects and SE's.   Over 30 minutes of exam, counseling, chart review, and critical decision making was performed.   Future Appointments  Date Time Provider Department Center  10/27/2022 11:00 AM Raynelle Dick, NP [facility] None  02/01/2023 11:30 AM Lucky Cowboy, MD [facility] None  05/04/2023 10:30 AM Raynelle Dick, NP [facility] None  08/08/2023 10:00 AM Raynelle Dick, NP [facility] None    ------------------------------------------------------------------------------------------------------------------   HPI BP 130/72   Pulse 66   Temp 97.9 F (36.6 C)   Ht 5\' 8"  (1.727 m)   Wt 148 lb 12.8 oz (67.5 kg)   LMP 11/17/2002   SpO2 98%   BMI 22.62 kg/m   72 y.o.female presents for right ankle swelling. States in has been going on for since 06/2022. She sprained her right ankle last year and states it is only painful when it swells and shoe gets tight. She does wear walking boot she was given with sprain. Mostly the swelling only occurs to right ankle when she wears low rise socks.    BP well controlled with Norvasc 2.5 mg and hydrochlorothiazide 25 mg every day  BP Readings from Last 3 Encounters:  10/08/22 130/72  07/15/22 138/78  04/19/22 122/60  Denies headaches, chest pain, shortness of breath and dizziness   BMI is Body mass index is 22.62 kg/m., she has  been working on diet and exercise. Wt Readings from Last 3 Encounters:  10/08/22 148 lb 12.8 oz (67.5 kg)  07/15/22 143 lb 12.8 oz (65.2 kg)  04/19/22 148 lb 9.6 oz (67.4 kg)     Past Medical History:  Diagnosis Date   Abnormal Pap smear of cervix    Acute lower GI bleeding 02/16/2016   Blood transfusion without reported diagnosis 2018   Heart murmur    Hyperlipidemia    Hypertension    Prediabetes    Seasonal allergies    Thyroiditis 07/22/2017   Vitamin D deficiency      Allergies  Allergen Reactions   Amoxil [Amoxicillin] Rash   Ampicillin Rash    Current Outpatient Medications on File Prior to Visit  Medication Sig   acetaminophen (TYLENOL) 325 MG tablet Take 2 tablets (650 mg total) by mouth every 6 (six) hours as needed for mild pain (or Fever >/= 101).   amLODipine (NORVASC) 2.5 MG tablet Take  1 tablet  Daily  for BP   Ascorbic Acid (VITAMIN C) 500 MG CAPS Take by mouth.   aspirin EC 81 MG tablet Take 81 mg by mouth every other day.    calcium carbonate (OS-CAL) 600 MG TABS tablet Take 600 mg by mouth daily with breakfast.   Cholecalciferol (VITAMIN D3) 125 MCG (5000 UT) CAPS Take 5,000 Units by mouth daily.   fish oil-omega-3 fatty acids 1000 MG capsule Take 1 g  by mouth 3 (three) times daily with meals.    Flaxseed, Linseed, (FLAXSEED OIL PO) Take 1 capsule by mouth 3 (three) times daily with meals.    hydrochlorothiazide (HYDRODIURIL) 25 MG tablet Take  1 tablet  Daily  for BP & Fluid Retention                                                                       /                                                                   TAKE                                         BY                                                 MOUTH   ibuprofen (ADVIL) 800 MG tablet TAKE 1 TABLET BY MOUTH EVERY 8 HOURS AS NEEDED   Multiple Vitamins-Minerals (CENTRUM SILVER PO) Take by mouth.   Zinc 50 MG TABS Take 1/2 tablet  (25 mg)  Daily   OVER THE COUNTER MEDICATION Takes Allergy  meds as needed (Patient not taking: Reported on 10/08/2022)   No current facility-administered medications on file prior to visit.    ROS: all negative except above.   Physical Exam:  BP 130/72   Pulse 66   Temp 97.9 F (36.6 C)   Ht 5\' 8"  (1.727 m)   Wt 148 lb 12.8 oz (67.5 kg)   LMP 11/17/2002   SpO2 98%   BMI 22.62 kg/m   General Appearance: Well nourished, in no apparent distress. Eyes: PERRLA, EOMs, conjunctiva no swelling or erythema  Neck: Supple, thyroid normal.  Respiratory: Respiratory effort normal, BS equal bilaterally without rales, rhonchi, wheezing or stridor.  Cardio: RRR with no MRGs. Brisk peripheral pulses without edema.  Abdomen: Soft, + BS. Musculoskeletal: Full ROM, 5/5 strength, normal gait. Right ankle 2+ pitting edema- not involving entire foot or lower leg Skin: Warm, dry without rashes, lesions, ecchymosis.  Neuro: Cranial nerves intact. Normal muscle tone, no cerebellar symptoms. Sensation intact.  Psych: Awake and oriented X 3, normal affect, Insight and Judgment appropriate.     Raynelle Dick, NP 11:00 AM [facility]

## 2022-10-08 ENCOUNTER — Ambulatory Visit
Admission: RE | Admit: 2022-10-08 | Discharge: 2022-10-08 | Disposition: A | Discharge status: Home / Self Care | Payer: Medicare HMO | Source: Ambulatory Visit | Attending: Nurse Practitioner | Admitting: Nurse Practitioner

## 2022-10-08 ENCOUNTER — Ambulatory Visit (INDEPENDENT_AMBULATORY_CARE_PROVIDER_SITE_OTHER): Payer: Medicare HMO | Admitting: Nurse Practitioner

## 2022-10-08 ENCOUNTER — Encounter: Payer: Self-pay | Admitting: Nurse Practitioner

## 2022-10-08 VITALS — BP 130/72 | HR 66 | Temp 97.9°F | Ht 68.0 in | Wt 148.8 lb

## 2022-10-08 DIAGNOSIS — M25471 Effusion, right ankle: Secondary | ICD-10-CM | POA: Diagnosis not present

## 2022-10-08 DIAGNOSIS — Z23 Encounter for immunization: Secondary | ICD-10-CM | POA: Diagnosis not present

## 2022-10-08 DIAGNOSIS — I10 Essential (primary) hypertension: Secondary | ICD-10-CM | POA: Diagnosis not present

## 2022-10-08 DIAGNOSIS — R600 Localized edema: Secondary | ICD-10-CM | POA: Diagnosis not present

## 2022-10-08 NOTE — Patient Instructions (Signed)
Edema  Edema is when you have too much fluid in your body or under your skin. Edema may make your legs, feet, and ankles swell. Swelling often happens in looser tissues, such as around your eyes. This is a common condition. It gets more common as you get older. There are many possible causes of edema. These include: Eating too much salt (sodium). Being on your feet or sitting for a long time. Certain medical conditions, such as: Pregnancy. Heart failure. Liver disease. Kidney disease. Cancer. Hot weather may make edema worse. Edema is usually painless. Your skin may look swollen or shiny. Follow these instructions at home: Medicines Take over-the-counter and prescription medicines only as told by your doctor. Your doctor may prescribe a medicine to help your body get rid of extra water (diuretic). Take this medicine if you are told to take it. Eating and drinking Eat a low-salt (low-sodium) diet as told by your doctor. Sometimes, eating less salt may reduce swelling. Depending on the cause of your swelling, you may need to limit how much fluid you drink (fluid restriction). General instructions Raise the injured area above the level of your heart while you are sitting or lying down. Do not sit still or stand for a long time. Do not wear tight clothes. Do not wear garters on your upper legs. Exercise your legs. This can help the swelling go down. Wear compression stockings as told by your doctor. It is important that these are the right size. These should be prescribed by your doctor to prevent possible injuries. If elastic bandages or wraps are recommended, use them as told by your doctor. Contact a doctor if: Treatment is not working. You have heart, liver, or kidney disease and have symptoms of edema. You have sudden and unexplained weight gain. Get help right away if: You have shortness of breath or chest pain. You cannot breathe when you lie down. You have pain, redness, or  warmth in the swollen areas. You have heart, liver, or kidney disease and get edema all of a sudden. You have a fever and your symptoms get worse all of a sudden. These symptoms may be an emergency. Get help right away. Call 911. Do not wait to see if the symptoms will go away. Do not drive yourself to the hospital. Summary Edema is when you have too much fluid in your body or under your skin. Edema may make your legs, feet, and ankles swell. Swelling often happens in looser tissues, such as around your eyes. Raise the injured area above the level of your heart while you are sitting or lying down. Follow your doctor's instructions about diet and how much fluid you can drink. This information is not intended to replace advice given to you by your health care provider. Make sure you discuss any questions you have with your health care provider. Document Revised: 09/08/2020 Document Reviewed: 09/08/2020 Elsevier Patient Education  2024 [employer].

## 2022-10-26 NOTE — Progress Notes (Unsigned)
ANNUAL MEDICARE WELLNESS  Assessment / Plan:   Diagnoses and all orders for this visit:  Annual Medicare Wellness Visit Due annually  Health maintenance reviewed - declines   Essential hypertension Continue current medications: amlodipine 5mg   Monitor blood pressure at home; call if consistently over 130/80 Continue DASH diet.   Reminder to go to the ER if any CP, SOB, nausea, dizziness, severe HA, changes vision/speech, left arm numbness and tingling and jaw pain. -     CBC with Differential/Platelet -     TSH -     COMPLETE METABOLIC PANEL WITH GFR  Hyperlipidemia, mixed Continue medications: Omega3  Discussed dietary and exercise modifications Low fat diet -     Lipid panel -     TSH  Medication management Continued  Vitamin D deficiency Continue supplementation to maintain goal of 70-100 Taking Vitamin D 5,000 IU daily  Seasonal allergies Doing well at this time OTC PRN  Cystocele with prolapse Mild leaking; GYN follows  Has done some pelvic floor PT with benefit several years ago Encourage follow up/continue exercises as recommended  History of colonic polyps Colonoscopy UTD; next due 5 years (2028) per GI Discussed low red/processed meat, high fiber diet  BMI 21.0-21.9, adult Discussed dietary and exercise modifications  Right Hip pain Follows with [facility]   RICE, and exercises given S/P injection  No orders of the defined types were placed in this encounter.     Further disposition pending results if labs check today. Discussed med's effects and SE's.   Over 30 minutes of face to face interview, exam, counseling, chart review, and critical decision making was performed.   Future Appointments  Date Time Provider Department Center  10/27/2022 11:00 AM Raynelle Dick, NP [facility] None  02/01/2023 11:30 AM Lucky Cowboy, MD [facility] None  05/04/2023 10:30 AM Raynelle Dick, NP [facility] None  08/08/2023 10:00 AM Raynelle Dick, NP [facility] None  10/27/2023 11:30 AM Raynelle Dick, NP [facility] None    Plan:   During the course of the visit the patient was educated and counseled about appropriate screening and preventive services including:   Pneumococcal vaccine  Prevnar 13 Influenza vaccine Td vaccine Screening electrocardiogram Bone densitometry screening Colorectal cancer screening Diabetes screening Glaucoma screening Nutrition counseling  Advanced directives: requested     Subjective:  [name] [name] is a 72 y.o. female who presents for Medicare Annual Wellness Visit and 3 month follow up.   She has right hip arthritis, follows with [facility] and is s/p injection x 2 .  Reports this has helped some but not resolved. Only bothers with certain movements, managing, postponing on surgery. Avoids sleeping on that side, uses ibuprofen, tiger balm. Doesn't limit walking or stairs. Modifies crouching.   BMI is There is no height or weight on file to calculate BMI., she has been working on diet and not dedicated exercise "but I never sit" - walks 15 min with her dog, and watches grandkids.  Wt Readings from Last 3 Encounters:  10/08/22 148 lb 12.8 oz (67.5 kg)  07/15/22 143 lb 12.8 oz (65.2 kg)  04/19/22 148 lb 9.6 oz (67.4 kg)    Her blood pressure has been controlled at home, today their BP is   She does workout. She denies chest pain, shortness of breath, dizziness.   BP Readings from Last 3 Encounters:  10/08/22 130/72  07/15/22 138/78  04/19/22 122/60   She is not on cholesterol medication (flax seed, fish  oil). Her cholesterol is not at goal. The cholesterol last visit was:   Lab Results  Component Value Date   CHOL 192 07/15/2022   HDL 67 07/15/2022   LDLCALC 99 07/15/2022   TRIG 163 (H) 07/15/2022   CHOLHDL 2.9 07/15/2022    Last A1C in the office was:  Lab Results  Component Value Date   HGBA1C 6.0 (H) 07/15/2022   Last GFR: Lab Results  Component Value Date    GFRNONAA 95 07/14/2020   Patient is on Vitamin D supplement.   Lab Results  Component Value Date   VD25OH 77 07/15/2022     She has hx of thyroiditis that resolved, TSHs have been normal until last check; denies diarrhea, palpitations, heat intolerance. Does report intermittent insomnia.  Lab Results  Component Value Date   TSH 4.34 07/15/2022     Medication Review:   Current Outpatient Medications (Cardiovascular):    amLODipine (NORVASC) 2.5 MG tablet, Take  1 tablet  Daily  for BP   hydrochlorothiazide (HYDRODIURIL) 25 MG tablet, Take  1 tablet  Daily  for BP & Fluid Retention                                                                       /                                                                   TAKE                                         BY                                                 MOUTH   Current Outpatient Medications (Analgesics):    acetaminophen (TYLENOL) 325 MG tablet, Take 2 tablets (650 mg total) by mouth every 6 (six) hours as needed for mild pain (or Fever >/= 101).   aspirin EC 81 MG tablet, Take 81 mg by mouth every other day.    ibuprofen (ADVIL) 800 MG tablet, TAKE 1 TABLET BY MOUTH EVERY 8 HOURS AS NEEDED   Current Outpatient Medications (Other):    Ascorbic Acid (VITAMIN C) 500 MG CAPS, Take by mouth.   calcium carbonate (OS-CAL) 600 MG TABS tablet, Take 600 mg by mouth daily with breakfast.   Cholecalciferol (VITAMIN D3) 125 MCG (5000 UT) CAPS, Take 5,000 Units by mouth daily.   fish oil-omega-3 fatty acids 1000 MG capsule, Take 1 g by mouth 3 (three) times daily with meals.    Flaxseed, Linseed, (FLAXSEED OIL PO), Take 1 capsule by mouth 3 (three) times daily with meals.    Multiple Vitamins-Minerals (CENTRUM SILVER PO), Take by mouth.   OVER THE COUNTER MEDICATION, Takes Allergy meds as  needed (Patient not taking: Reported on 10/08/2022)   Zinc 50 MG TABS, Take 1/2 tablet  (25 mg)  Daily  Allergies Allergies  Allergen Reactions    Amoxil [Amoxicillin] Rash   Ampicillin Rash    Current Problems (verified) Patient Active Problem List   Diagnosis Date Noted   Abnormal glucose 08/20/2018   History of thyroiditis 03/26/2018   Osteopenia of left femoral neck 03/23/2018   Cystocele with prolapse 03/23/2018   BMI 22.0-22.9, adult 06/01/2017   Medication management 07/26/2014   Hyperlipidemia, mixed    Seasonal allergies    Vitamin D deficiency    Hypertension    History of colonic polyps 03/23/2011    Screening Tests Immunization History  Administered Date(s) Administered   Influenza Inj Mdck Quad With Preservative 01/03/2020   Influenza Split 01/03/2013   Influenza, High Dose Seasonal PF 10/23/2015, 11/08/2016, 11/25/2017, 10/04/2018, 11/06/2020, 10/29/2021, 10/08/2022   PPD Test 07/13/2013   Pneumococcal Conjugate-13 01/08/2016   Pneumococcal Polysaccharide-23 12/19/2017   Pneumococcal-Unspecified 01/18/2002   Tdap 01/19/2003, 07/05/2012   Zoster, Live 07/05/2012   Preventative care: Last colonoscopy: 06/23/2021, 2 mm adenoma, 5 year recall per Dr. Leone Payor  Last mammogram: 03/13/2021 - gets annually at the breast center  Last pap smear/pelvic exam: 2021 Dr Oscar La, goes every other year DEXA: 03/2019 - T -1.2  COVID: declines Shingrix- has not had yet- declines for now   Names of Other Physician/Practitioners you currently use: 1. [facility] here for primary care 2. Eye Exam: Dr. Emily Filbert, 05/2021 3. Dental Exam: Dr. Mayford Knife, 2023, Q71months  Patient Care Team: Lucky Cowboy, MD as PCP - General (Internal Medicine)   SURGICAL HISTORY She  has a past surgical history that includes Cardiac surgery (1958); Colonoscopy; Cervix lesion destruction (N/A, ? years ago); Tubal ligation; Colonoscopy (N/A, 02/16/2016); and Cryotherapy. FAMILY HISTORY Her family history includes Colon cancer (age of onset: 29) in her sister; Diabetes in her brother, father, sister, and  sister. SOCIAL HISTORY She  reports that she has never smoked. She has never used smokeless tobacco. She reports current alcohol use of about 7.0 standard drinks of alcohol per week. She reports that she does not use drugs.  MEDICARE WELLNESS OBJECTIVES: Physical activity:   Cardiac risk factors:   Depression/mood screen:      07/14/2021    9:47 AM  Depression screen PHQ 2/9  Decreased Interest 0  Down, Depressed, Hopeless 0  PHQ - 2 Score 0    ADLs:      No data to display           Cognitive Testing  Alert? Yes  Normal Appearance?Yes  Oriented to person? Yes  Place? Yes   Time? Yes  Recall of three objects?  Yes  Can perform simple calculations? Yes  Displays appropriate judgment?Yes  Can read the correct time from a watch face?Yes  EOL planning:       Review of Systems  Constitutional:  Negative for malaise/fatigue and weight loss.  HENT:  Negative for hearing loss and tinnitus.   Eyes:  Negative for blurred vision and double vision.  Respiratory:  Negative for cough, sputum production, shortness of breath and wheezing.   Cardiovascular:  Negative for chest pain, palpitations, orthopnea, claudication, leg swelling and PND.  Gastrointestinal:  Negative for abdominal pain, blood in stool, constipation, diarrhea, heartburn, melena, nausea and vomiting.  Genitourinary: Negative.   Musculoskeletal:  Positive for joint pain (R hip pain intermittently). Negative for falls and myalgias.  Skin:  Negative for rash.  Neurological:  Negative for dizziness, tingling, sensory change, weakness and headaches.  Endo/Heme/Allergies:  Negative for polydipsia.  Psychiatric/Behavioral: Negative.  Negative for depression, memory loss, substance abuse and suicidal ideas. The patient is not nervous/anxious and does not have insomnia.   All other systems reviewed and are negative.    Objective:     There were no vitals filed for this visit.  There is no height or weight on file to  calculate BMI.  General appearance: alert, no distress, WD/WN, female HEENT: normocephalic, sclerae anicteric, TMs pearly, nares patent, no discharge or erythema, pharynx normal Oral cavity: MMM, no lesions Neck: supple, no lymphadenopathy, no thyromegaly, no masses Heart: RRR, normal S1, S2, no murmurs Lungs: CTA bilaterally, no wheezes, rhonchi, or rales Abdomen: +bs, soft, non tender, non distended, no masses, no hepatomegaly, no splenomegaly Musculoskeletal: nontender, no swelling, no obvious deformity. Patient is able to ambulate well.  Gait is not  antalgic.Right hip: positives: pain with movement of hip and pain with rotation and negatives: FROM no pain with heel impact pulses full without SI pain.Good distal sensations and pulses bilaterally. R knee full ROM, mild patellar crepitus.  Extremities: no edema, no cyanosis, no clubbing Pulses: 2+ symmetric, upper and lower extremities, normal cap refill Neurological: alert, oriented x 3, CN2-12 intact, strength normal upper extremities and lower extremities, sensation normal throughout, DTRs 2+ throughout, no cerebellar signs, gait normal Psychiatric: normal affect, behavior normal, pleasant    Medicare Attestation I have personally reviewed: The patient's medical and social history Their use of alcohol, tobacco or illicit drugs Their current medications and supplements The patient's functional ability including ADLs,fall risks, home safety risks, cognitive, and hearing and visual impairment Diet and physical activities Evidence for depression or mood disorders  The patient's weight, height, BMI, and visual acuity have been recorded in the chart.  I have made referrals, counseling, and provided education to the patient based on review of the above and I have provided the patient with a written personalized care plan for preventive services.     Raynelle Dick, NP 12:59 PM [facility]

## 2022-10-27 ENCOUNTER — Encounter: Payer: Self-pay | Admitting: Nurse Practitioner

## 2022-10-27 ENCOUNTER — Ambulatory Visit (INDEPENDENT_AMBULATORY_CARE_PROVIDER_SITE_OTHER): Payer: Medicare HMO | Admitting: Nurse Practitioner

## 2022-10-27 VITALS — BP 108/64 | HR 72 | Temp 97.5°F | Ht 68.0 in | Wt 146.6 lb

## 2022-10-27 DIAGNOSIS — Z0000 Encounter for general adult medical examination without abnormal findings: Secondary | ICD-10-CM

## 2022-10-27 DIAGNOSIS — Z860100 Personal history of colon polyps, unspecified: Secondary | ICD-10-CM

## 2022-10-27 DIAGNOSIS — M7989 Other specified soft tissue disorders: Secondary | ICD-10-CM

## 2022-10-27 DIAGNOSIS — R7309 Other abnormal glucose: Secondary | ICD-10-CM | POA: Diagnosis not present

## 2022-10-27 DIAGNOSIS — M25551 Pain in right hip: Secondary | ICD-10-CM

## 2022-10-27 DIAGNOSIS — Z6821 Body mass index (BMI) 21.0-21.9, adult: Secondary | ICD-10-CM | POA: Diagnosis not present

## 2022-10-27 DIAGNOSIS — Z79899 Other long term (current) drug therapy: Secondary | ICD-10-CM

## 2022-10-27 DIAGNOSIS — Z0001 Encounter for general adult medical examination with abnormal findings: Secondary | ICD-10-CM

## 2022-10-27 DIAGNOSIS — I10 Essential (primary) hypertension: Secondary | ICD-10-CM | POA: Diagnosis not present

## 2022-10-27 DIAGNOSIS — M85852 Other specified disorders of bone density and structure, left thigh: Secondary | ICD-10-CM | POA: Diagnosis not present

## 2022-10-27 DIAGNOSIS — Z6822 Body mass index (BMI) 22.0-22.9, adult: Secondary | ICD-10-CM

## 2022-10-27 DIAGNOSIS — R6889 Other general symptoms and signs: Secondary | ICD-10-CM

## 2022-10-27 DIAGNOSIS — E559 Vitamin D deficiency, unspecified: Secondary | ICD-10-CM

## 2022-10-27 DIAGNOSIS — E782 Mixed hyperlipidemia: Secondary | ICD-10-CM | POA: Diagnosis not present

## 2022-10-27 DIAGNOSIS — N814 Uterovaginal prolapse, unspecified: Secondary | ICD-10-CM

## 2022-10-27 DIAGNOSIS — J302 Other seasonal allergic rhinitis: Secondary | ICD-10-CM | POA: Diagnosis not present

## 2022-10-27 NOTE — Patient Instructions (Signed)

## 2022-10-28 LAB — COMPLETE METABOLIC PANEL WITH GFR
AG Ratio: 1.7 (calc) (ref 1.0–2.5)
ALT: 16 U/L (ref 6–29)
AST: 19 U/L (ref 10–35)
Albumin: 4.7 g/dL (ref 3.6–5.1)
Alkaline phosphatase (APISO): 52 U/L (ref 37–153)
BUN: 14 mg/dL (ref 7–25)
CO2: 31 mmol/L (ref 20–32)
Calcium: 10.2 mg/dL (ref 8.6–10.4)
Chloride: 100 mmol/L (ref 98–110)
Creat: 0.66 mg/dL (ref 0.60–1.00)
Globulin: 2.7 g/dL (ref 1.9–3.7)
Glucose, Bld: 100 mg/dL — ABNORMAL HIGH (ref 65–99)
Potassium: 4.1 mmol/L (ref 3.5–5.3)
Sodium: 139 mmol/L (ref 135–146)
Total Bilirubin: 0.4 mg/dL (ref 0.2–1.2)
Total Protein: 7.4 g/dL (ref 6.1–8.1)
eGFR: 93 mL/min/{1.73_m2} (ref >=60)

## 2022-10-28 LAB — TSH: TSH: 2.94 m[IU]/L (ref 0.40–4.50)

## 2022-10-28 LAB — CBC WITH DIFFERENTIAL/PLATELET
Absolute Monocytes: 454 {cells}/uL (ref 200–950)
Basophils Absolute: 38 {cells}/uL (ref 0–200)
Basophils Relative: 0.6 %
Eosinophils Absolute: 83 {cells}/uL (ref 15–500)
Eosinophils Relative: 1.3 %
HCT: 38.9 % (ref 35.0–45.0)
Hemoglobin: 12.4 g/dL (ref 11.7–15.5)
Lymphs Abs: 1914 {cells}/uL (ref 850–3900)
MCH: 28.8 pg (ref 27.0–33.0)
MCHC: 31.9 g/dL — ABNORMAL LOW (ref 32.0–36.0)
MCV: 90.5 fL (ref 80.0–100.0)
MPV: 10.2 fL (ref 7.5–12.5)
Monocytes Relative: 7.1 %
Neutro Abs: 3910 {cells}/uL (ref 1500–7800)
Neutrophils Relative %: 61.1 %
Platelets: 438 10*3/uL — ABNORMAL HIGH (ref 140–400)
RBC: 4.3 10*6/uL (ref 3.80–5.10)
RDW: 12.1 % (ref 11.0–15.0)
Total Lymphocyte: 29.9 %
WBC: 6.4 10*3/uL (ref 3.8–10.8)

## 2022-10-28 LAB — LIPID PANEL
Cholesterol: 168 mg/dL (ref <200)
HDL: 55 mg/dL (ref >=50)
LDL Cholesterol (Calc): 91 mg/dL
Non-HDL Cholesterol (Calc): 113 mg/dL (ref <130)
Total CHOL/HDL Ratio: 3.1 (calc) (ref <5.0)
Triglycerides: 121 mg/dL (ref <150)

## 2022-12-12 ENCOUNTER — Other Ambulatory Visit: Payer: Self-pay | Admitting: Internal Medicine

## 2022-12-15 ENCOUNTER — Other Ambulatory Visit: Payer: Self-pay | Admitting: Internal Medicine

## 2022-12-15 ENCOUNTER — Other Ambulatory Visit: Payer: Self-pay

## 2022-12-15 DIAGNOSIS — I10 Essential (primary) hypertension: Secondary | ICD-10-CM

## 2022-12-15 MED ORDER — AMLODIPINE BESYLATE 2.5 MG PO TABS: ORAL_TABLET | ORAL | 3 refills | Status: DC

## 2023-01-31 ENCOUNTER — Encounter: Payer: Self-pay | Admitting: Internal Medicine

## 2023-01-31 NOTE — Patient Instructions (Addendum)
 Due to recent changes in healthcare laws, you may see the results of your imaging and laboratory studies on MyChart before your provider has had a chance to review them.  We understand that in some cases there may be results that are confusing or concerning to you. Not all laboratory results come back in the same time frame and the provider may be waiting for multiple results in order to interpret others.  Please give us  48 hours in order for your provider to thoroughly review all the results before contacting the office for clarification of your results.  ++++++++++++++++++++++++++  Vit D  & Vit C 1,000 mg   are recommended to help protect  against the Covid-19 and other Corona viruses.    Also it's recommended  to take  Zinc  50 mg x 1/2 tab = 25  mg / daily  to help  protect against the Covid-19   and best place to get  is also on [email or web address]  and don't pay more than 6-8 cents /pill !   +++++++++++++++++++++++++++++++++++++++ Recommend Adult Low Dose Aspirin or  coated  Aspirin 81 mg daily  To reduce risk of Colon Cancer 40 %,  Skin Cancer 26 % ,  Melanoma 46%  and  Pancreatic cancer 60% +++++++++++++++++++++++++++++++++++++++++ Vitamin D  goal  is between 70-100.  Please make sure that you are taking your Vitamin D  as directed.  It is very important as a natural anti-inflammatory  helping hair, skin, and nails, as well as reducing stroke and heart attack risk.  It helps your bones and helps with mood. It also decreases numerous cancer risks so please take it as directed.  Low Vit D is associated with a 200-300% higher risk for CANCER  and 200-300% higher risk for HEART   ATTACK  &  STROKE.   .....................................SABRA It is also associated with higher death rate at younger ages,  autoimmune diseases like Rheumatoid arthritis, Lupus, Multiple Sclerosis.    Also many other serious conditions, like depression, Alzheimer's Dementia, infertility, muscle aches, fatigue,  fibromyalgia - just to name a few. +++++++++++++++++++++++++++++++++++++++++ Recommend the book The END of DIETING by Dr Marty Resides  & the book The END of DIABETES  by Dr Marty Resides At [email or web address] - get book & Audio CD's    Being diabetic has a  300% increased risk for heart attack, stroke, cancer, and alzheimer- type vascular dementia. It is very important that you work harder with diet by avoiding all foods that are white. Avoid white rice (brown & wild rice is OK), white potatoes (sweetpotatoes in moderation is OK), White bread or wheat bread or anything made out of white flour like bagels, donuts, rolls, buns, biscuits, cakes, pastries, cookies, pizza crust, and pasta (made from white flour & egg whites) - vegetarian pasta or spinach or wheat pasta is OK. Multigrain breads like Arnold's or [employer], or multigrain sandwich thins or flatbreads.  Diet, exercise and weight loss can reverse and cure diabetes in the early stages.  Diet, exercise and weight loss is very important in the control and prevention of complications of diabetes which affects every system in your body, ie. Brain - dementia/stroke, eyes - glaucoma/blindness, heart - heart attack/heart failure, kidneys - dialysis, stomach - gastric paralysis, intestines - malabsorption, nerves - severe painful neuritis, circulation - gangrene & loss of a leg(s), and finally cancer and Alzheimers.    I recommend avoid fried & greasy foods,  sweets/candy, white rice (brown or wild rice or Quinoa is  OK), white potatoes (sweet potatoes are OK) - anything made from white flour - bagels, doughnuts, rolls, buns, biscuits,white and wheat breads, pizza crust and traditional pasta made of white flour & egg white(vegetarian pasta or spinach or wheat pasta is OK).  Multi-grain bread is OK - like multi-grain flat bread or sandwich thins. Avoid alcohol in excess. Exercise is also important.    Eat all the vegetables you want - avoid meat, especially red  meat and dairy - especially cheese.  Cheese is the most concentrated form of trans-fats which is the worst thing to clog up our arteries. Veggie cheese is OK which can be found in the fresh produce section at [employer] or [employer] or [employer]  +++++++++++++++++++++++++++++++++++++++ DASH Eating Plan  DASH stands for Dietary Approaches to Stop Hypertension.   The DASH eating plan is a healthy eating plan that has been shown to reduce high blood pressure (hypertension). Additional health benefits may include reducing the risk of type 2 diabetes mellitus, heart disease, and stroke. The DASH eating plan may also help with weight loss. WHAT DO I NEED TO KNOW ABOUT THE DASH EATING PLAN? For the DASH eating plan, you will follow these general guidelines: Choose foods with a percent daily value for sodium of less than 5% (as listed on the food label). Use salt-free seasonings or herbs instead of table salt or sea salt. Check with your health care provider or pharmacist before using salt substitutes. Eat lower-sodium products, often labeled as lower sodium or no salt added. Eat fresh foods. Eat more vegetables, fruits, and low-fat dairy products. Choose whole grains. Look for the word whole as the first word in the ingredient list. Choose fish  Limit sweets, desserts, sugars, and sugary drinks. Choose heart-healthy fats. Eat veggie cheese  Eat more home-cooked food and less restaurant, buffet, and fast food. Limit fried foods. Cook foods using methods other than frying. Limit canned vegetables. If you do use them, rinse them well to decrease the sodium. When eating at a restaurant, ask that your food be prepared with less salt, or no salt if possible.                      WHAT FOODS CAN I EAT? Read Dr Marty Fuhrman's books on The End of Dieting & The End of Diabetes  Grains Whole grain or whole wheat bread. Brown rice. Whole grain or whole wheat pasta. Quinoa, bulgur, and whole  grain cereals. Low-sodium cereals. Corn or whole wheat flour tortillas. Whole grain cornbread. Whole grain crackers. Low-sodium crackers.  Vegetables Fresh or frozen vegetables (raw, steamed, roasted, or grilled). Low-sodium or reduced-sodium tomato and vegetable juices. Low-sodium or reduced-sodium tomato sauce and paste. Low-sodium or reduced-sodium canned vegetables.   Fruits All fresh, canned (in natural juice), or frozen fruits.  Protein Products  All fish and seafood.  Dried beans, peas, or lentils. Unsalted nuts and seeds. Unsalted canned beans.  Dairy Low-fat dairy products, such as skim or 1% milk, 2% or reduced-fat cheeses, low-fat ricotta or cottage cheese, or plain low-fat yogurt. Low-sodium or reduced-sodium cheeses.  Fats and Oils Tub margarines without trans fats. Light or reduced-fat mayonnaise and salad dressings (reduced sodium). Avocado. Safflower, olive, or canola oils. Natural peanut or almond butter.  Other Unsalted popcorn and pretzels. The items listed above may not be a complete list of recommended foods or beverages. Contact your dietitian for more options.  +++++++++++++++  WHAT FOODS ARE NOT RECOMMENDED? Grains/ White flour or  wheat flour White bread. White pasta. White rice. Refined cornbread. Bagels and croissants. Crackers that contain trans fat.  Vegetables  Creamed or fried vegetables. Vegetables in a . Regular canned vegetables. Regular canned tomato sauce and paste. Regular tomato and vegetable juices.  Fruits Dried fruits. Canned fruit in light or heavy syrup. Fruit juice.  Meat and Other Protein Products Meat in general - RED meat & White meat.  Fatty cuts of meat. Ribs, chicken wings, all processed meats as bacon, sausage, bologna, salami, fatback, hot dogs, bratwurst and packaged luncheon meats.  Dairy Whole or 2% milk, cream, half-and-half, and cream cheese. Whole-fat or sweetened yogurt. Full-fat cheeses or blue cheese. Non-dairy creamers  and whipped toppings. Processed cheese, cheese spreads, or cheese curds.  Condiments Onion and garlic salt, seasoned salt, table salt, and sea salt. Canned and packaged gravies. Worcestershire sauce. Tartar sauce. Barbecue sauce. Teriyaki sauce. Soy sauce, including reduced sodium. Steak sauce. Fish sauce. Oyster sauce. Cocktail sauce. Horseradish. Ketchup and mustard. Meat flavorings and tenderizers. Bouillon cubes. Hot sauce. Tabasco sauce. Marinades. Taco seasonings. Relishes.  Fats and Oils Butter, stick margarine, lard, shortening and bacon fat. Coconut, palm kernel, or palm oils. Regular salad dressings.  Pickles and olives. Salted popcorn and pretzels.  The items listed above may not be a complete list of foods and beverages to avoid.

## 2023-01-31 NOTE — Progress Notes (Signed)
 [facility]      ADULT   &   ADOLESCENT      INTERNAL MEDICINE  Elsie Richards, M.D.          Lonell Rous, ANP        Tonya Cranford, FNP  [facility] [address]  [place], [place]. [address] Telephone [phone] Telefax [phone]   Future Appointments  Date Time Provider Department  02/01/2023                      6  mo ov 11:30 AM Richards Elsie, MD [facility]  05/04/2023                      9  mo ov 10:30 AM Wilkinson, Dana E, NP [facility]  08/08/2023                      cpe 10:00 AM Wilkinson, Dana E, NP [facility]  10/27/2023                      wellness 11:30 AM Wilkinson, Dana E, NP [facility]    History of Present Illness:      This very nice 73 y.o. MWF presents for 6 month follow up with HTN, HLD, Pre-Diabetes and Vitamin D  Deficiency.   Patient was dx'd with Thyroiditis in May-July 2019 tx'd w/Prednisone  - tapered to d/c in Sept 2019.         Patient is treated for HTN  since   2001 & BP has been controlled at home. Today's BP is at goal -  138/74. Patient has had no complaints of any cardiac type chest pain, palpitations, dyspnea chet /PND, dizziness, claudication or dependent edema.        Hyperlipidemia is not controlled with diet & supplements . Last Lipids were at goal :  Lab Results  Component Value Date   CHOL 168 10/27/2022   HDL 55 10/27/2022   LDLCALC 91 10/27/2022   TRIG 121 10/27/2022   CHOLHDL 3.1 10/27/2022     Also, the patient has history of  PreDiabetes (A1c 6.0% /2014  & 5.9% /2017)  and has had no symptoms of reactive hypoglycemia, diabetic polys, paresthesias or visual blurring.  Last A1c was not at goal :  Lab Results  Component Value Date   HGBA1C 6.0 (H) 07/15/2022                                                       Further, the patient also has history of Vitamin D  Deficiency (24 /2008)  and supplements vitamin D  . Last vitamin D  was at goal :  Lab Results  Component Value Date    VD25OH 77 07/15/2022       Current Outpatient Medications  Medication Instructions   acetaminophen   650 mg  Every 6 hours PRN   amLODipine  2.5 MG  Take  1 tablet  Daily    VITAMIN C 500 MG  Daily   aspirin EC 81 mg  Every other day   OS-CAL 600 mg Daily with breakfast   fish oil-omega-3   1 gram 3 times daily with meals   FLAXSEED OIL 1 capsule 3 times daily with meals   hydrochlorothiazide  25 MG  Take  1 tablet  Daily   ibuprofen  800 mg  Every 8 hours PRN   CENTRUM SILVER  Daily   OTC Allergy   Takes as needed   Vitamin D    5,000 Units Daily   Zinc  50 MG  Take 1/2 tablet  (25 mg)  Daily     Allergies  Allergen Reactions   Amoxil [Amoxicillin] Rash   Ampicillin Rash     PMHx:   Past Medical History:  Diagnosis Date   Abnormal Pap smear of cervix    Acute lower GI bleeding 02/16/2016   Blood transfusion without reported diagnosis 2018   Heart murmur    Hyperlipidemia    Hypertension    Prediabetes    Seasonal allergies    Thyroiditis 07/22/2017   Vitamin D  deficiency      Immunization History  Administered Date(s) Administered   Influenza Inj Mdck Quad  01/03/2020   Influenza Split 01/03/2013   Influenza, High Dose  11/25/2017, 10/04/2018, 11/06/2020, 10/29/2021   PPD Test 07/13/2013   Pneumococcal -13 01/08/2016   Pneumococcal -23 12/19/2017   Pneumococcal-23 01/18/2002   Tdap 01/19/2003, 07/05/2012   Zoster, Live 07/05/2012     Past Surgical History:  Procedure Laterality Date   CARDIAC SURGERY  1958   at age 30 for hole in heart   CERVIX LESION DESTRUCTION N/A ? years ago   with abnormal pap   COLONOSCOPY     polyps   COLONOSCOPY N/A 02/16/2016   Procedure: COLONOSCOPY;  Surgeon: Gwendlyn ONEIDA Buddy, MD;  Location: [facility];  Service: Endoscopy;  Laterality: N/A;   CRYOTHERAPY     for abnormal pap smear   TUBAL LIGATION       FHx:    Reviewed / unchanged   SHx:    Reviewed / unchanged    Systems Review:  Constitutional: Denies  fever, chills, wt changes, headaches, insomnia, fatigue, night sweats, change in appetite. Eyes: Denies redness, blurred vision, diplopia, discharge, itchy, watery eyes.  ENT: Denies discharge, congestion, post nasal drip, epistaxis, sore throat, earache, hearing loss, dental pain, tinnitus, vertigo, sinus pain, snoring.  CV: Denies chest pain, palpitations, irregular heartbeat, syncope, dyspnea, diaphoresis, orthopnea, PND, claudication or edema. Respiratory: denies cough, dyspnea, DOE, pleurisy, hoarseness, laryngitis, wheezing.  Gastrointestinal: Denies dysphagia, odynophagia, heartburn, reflux, water brash, abdominal pain or cramps, nausea, vomiting, bloating, diarrhea, constipation, hematemesis, melena, hematochezia  or hemorrhoids. Genitourinary: Denies dysuria, frequency, urgency, nocturia, hesitancy, discharge, hematuria or flank pain. Musculoskeletal: Denies arthralgias, myalgias, stiffness, jt. swelling, pain, limping or strain/sprain.  Skin: Denies pruritus, rash, hives, warts, acne, eczema or change in skin lesion(s). Neuro: No weakness, tremor, incoordination, spasms, paresthesia or pain. Psychiatric: Denies confusion, memory loss or sensory loss. Endo: Denies change in weight, skin or hair change.  Heme/Lymph: No excessive bleeding, bruising or enlarged lymph nodes.   Physical Exam  BP 138/74   Pulse 69   Temp 97.9 F (36.6 C)   Resp 16   Ht 5' 8 (1.727 m)   Wt 147 lb 12.8 oz (67 kg)   LMP 11/17/2002   SpO2 96%   BMI 22.47 kg/m   Appears  well nourished, well groomed  and in no distress.  Eyes: PERRLA, EOMs, conjunctiva no swelling or erythema. Sinuses: No frontal/maxillary tenderness ENT/Mouth: EAC's clear, TM's nl w/o erythema, bulging. Nares clear w/o erythema, swelling, exudates. Oropharynx clear without erythema or exudates. Oral hygiene is good. Tongue normal, non obstructing. Hearing intact.  Neck: Supple. Thyroid  not palpable. Car 2+/2+ without bruits,  nodes  or JVD. Chest: Respirations nl with BS clear & equal w/o rales, rhonchi, wheezing or stridor.  Cor: Heart sounds normal w/ regular rate and rhythm without sig. murmurs, gallops, clicks or rubs. Peripheral pulses normal and equal  without edema.  Abdomen: Soft & bowel sounds normal. Non-tender w/o guarding, rebound, hernias, masses or organomegaly.  Lymphatics: Unremarkable.  Musculoskeletal: Full ROM all peripheral extremities, joint stability, 5/5 strength and normal gait.  Skin: Warm, dry without exposed rashes, lesions or ecchymosis apparent.  Neuro: Cranial nerves intact, reflexes equal bilaterally. Sensory-motor testing grossly intact. Tendon reflexes grossly intact.  Pysch: Alert & oriented x 3.  Insight and judgement nl & appropriate. No ideations.   Assessment and Plan:   1. Essential hypertension (Primary)  - Continue medication, monitor blood pressure at home.  - Continue DASH diet.  Reminder to go to the ER if any CP,  SOB, nausea, dizziness, severe HA, changes vision/speech.   - CBC with Differential/Platelet - COMPLETE METABOLIC PANEL WITH GFR - Magnesium - TSH   2. Hyperlipidemia, mixed  - Continue diet/meds, exercise,& lifestyle modifications.  - Continue monitor periodic cholesterol/liver & renal functions    - Lipid panel - TSH   3. Abnormal glucose  - Continue diet, exercise  - Lifestyle modifications.  - Monitor appropriate labs    - Hemoglobin A1c - Insulin , random   4. Vitamin D  deficiency  - Continue supplementation.   - VITAMIN D  25 Hydroxy    5. Medication management  - CBC with Differential/Platelet - COMPLETE METABOLIC PANEL WITH GFR - Magnesium - Lipid panel - TSH - Hemoglobin A1c - Insulin , random - VITAMIN D  25 Hydroxy          Discussed  regular exercise, BP monitoring, weight control to achieve/maintain BMI less than 25 and discussed med and SE's. Recommended labs to assess /monitor clinical status .  I discussed the  assessment and treatment plan with the patient. The patient was provided an opportunity to ask questions and all were answered. The patient agreed with the plan and demonstrated an understanding of the instructions.  I provided over 30 minutes of exam, counseling, chart review and  complex critical decision making.        The patient was advised to call back or seek an in-person evaluation if the symptoms worsen or if the condition fails to improve as anticipated.   Elsie JONETTA Richards, MD

## 2023-02-01 ENCOUNTER — Ambulatory Visit (INDEPENDENT_AMBULATORY_CARE_PROVIDER_SITE_OTHER): Payer: Medicare HMO | Admitting: Internal Medicine

## 2023-02-01 ENCOUNTER — Encounter: Payer: Self-pay | Admitting: Internal Medicine

## 2023-02-01 VITALS — BP 138/74 | HR 69 | Temp 97.9°F | Resp 16 | Ht 68.0 in | Wt 147.8 lb

## 2023-02-01 DIAGNOSIS — Z79899 Other long term (current) drug therapy: Secondary | ICD-10-CM

## 2023-02-01 DIAGNOSIS — E782 Mixed hyperlipidemia: Secondary | ICD-10-CM | POA: Diagnosis not present

## 2023-02-01 DIAGNOSIS — E559 Vitamin D deficiency, unspecified: Secondary | ICD-10-CM

## 2023-02-01 DIAGNOSIS — I10 Essential (primary) hypertension: Secondary | ICD-10-CM

## 2023-02-01 DIAGNOSIS — R7309 Other abnormal glucose: Secondary | ICD-10-CM | POA: Diagnosis not present

## 2023-02-02 LAB — COMPLETE METABOLIC PANEL WITH GFR
AG Ratio: 1.8 (calc) (ref 1.0–2.5)
ALT: 19 U/L (ref 6–29)
AST: 19 U/L (ref 10–35)
Albumin: 4.8 g/dL (ref 3.6–5.1)
Alkaline phosphatase (APISO): 48 U/L (ref 37–153)
BUN: 18 mg/dL (ref 7–25)
CO2: 32 mmol/L (ref 20–32)
Calcium: 10.1 mg/dL (ref 8.6–10.4)
Chloride: 102 mmol/L (ref 98–110)
Creat: 0.67 mg/dL (ref 0.60–1.00)
Globulin: 2.7 g/dL (ref 1.9–3.7)
Glucose, Bld: 93 mg/dL (ref 65–99)
Potassium: 4.4 mmol/L (ref 3.5–5.3)
Sodium: 142 mmol/L (ref 135–146)
Total Bilirubin: 0.5 mg/dL (ref 0.2–1.2)
Total Protein: 7.5 g/dL (ref 6.1–8.1)
eGFR: 93 mL/min/{1.73_m2} (ref >=60)

## 2023-02-02 LAB — MAGNESIUM: Magnesium: 2 mg/dL (ref 1.5–2.5)

## 2023-02-02 LAB — CBC WITH DIFFERENTIAL/PLATELET
Absolute Lymphocytes: 1965 {cells}/uL (ref 850–3900)
Absolute Monocytes: 372 {cells}/uL (ref 200–950)
Basophils Absolute: 47 {cells}/uL (ref 0–200)
Basophils Relative: 0.8 %
Eosinophils Absolute: 59 {cells}/uL (ref 15–500)
Eosinophils Relative: 1 %
HCT: 39 % (ref 35.0–45.0)
Hemoglobin: 12.9 g/dL (ref 11.7–15.5)
MCH: 29.8 pg (ref 27.0–33.0)
MCHC: 33.1 g/dL (ref 32.0–36.0)
MCV: 90.1 fL (ref 80.0–100.0)
MPV: 11.2 fL (ref 7.5–12.5)
Monocytes Relative: 6.3 %
Neutro Abs: 3457 {cells}/uL (ref 1500–7800)
Neutrophils Relative %: 58.6 %
Platelets: 302 10*3/uL (ref 140–400)
RBC: 4.33 10*6/uL (ref 3.80–5.10)
RDW: 12.1 % (ref 11.0–15.0)
Total Lymphocyte: 33.3 %
WBC: 5.9 10*3/uL (ref 3.8–10.8)

## 2023-02-02 LAB — INSULIN, RANDOM: Insulin: 13.2 u[IU]/mL

## 2023-02-02 LAB — LIPID PANEL
Cholesterol: 189 mg/dL (ref <200)
HDL: 58 mg/dL (ref >=50)
LDL Cholesterol (Calc): 105 mg/dL — ABNORMAL HIGH
Non-HDL Cholesterol (Calc): 131 mg/dL — ABNORMAL HIGH (ref <130)
Total CHOL/HDL Ratio: 3.3 (calc) (ref <5.0)
Triglycerides: 147 mg/dL (ref <150)

## 2023-02-02 LAB — HEMOGLOBIN A1C
Hgb A1c MFr Bld: 5.9 %{Hb} — ABNORMAL HIGH (ref <5.7)
Mean Plasma Glucose: 123 mg/dL
eAG (mmol/L): 6.8 mmol/L

## 2023-02-02 LAB — VITAMIN D 25 HYDROXY (VIT D DEFICIENCY, FRACTURES): Vit D, 25-Hydroxy: 95 ng/mL (ref 30–100)

## 2023-02-02 LAB — TSH: TSH: 3.57 m[IU]/L (ref 0.40–4.50)

## 2023-02-03 NOTE — Progress Notes (Signed)
- Test results slightly outside the reference range are not unusual. If there is anything important, I will review this with you,  otherwise it is considered normal test values.  If you have further questions,  please do not hesitate to contact me at the office or via My Chart.   =========================================================================  -  Total  Chol =  189  -    Elevated             (  Ideal  or  Goal is less than 180  !  )  & -  Bad / Dangerous LDL  Chol =   105      - also Elevated              (  Ideal  or  Goal is less than 70  !  )    - Cholesterol is too high - Recommend a stricter low cholesterol diet   - Cholesterol only comes from animal sources                                                                                              - ie. meat, dairy, egg yolks  - Eat all the vegetables you want.  - Avoid Meat, Avoid Meat,  Avoid Meat                                                                           - especially Red Meat - Beef AND Pork .  - Avoid cheese & dairy - milk & ice cream.     - Cheese is the most concentrated form of trans-fats which                                                                               is the worst thing to clog up our arteries.    - Veggie cheese is OK which can be found in the fresh                                                 produce section at [employer] or [employer] or [employer]  =========================================================================  -  A1c = 5.9%   Blood sugar and A1c are elevated in the borderline and  early or pre-diabetes range which has the same   300% increased risk for heart attack, stroke, cancer and                                                                    alzheimer- type vascular dementia as full blown diabetes.   But the good news is that diet, exercise with                                                                               weight loss can cure the early diabetes at this point.   Being diabetic has a  300% increased risk for heart attack,                                                                          stroke, cancer, and alzheimer- type vascular dementia.   It is very important that you work harder with diet by                                                          avoiding all foods that are white except chicken, fish & calliflower.  - Avoid white rice  (brown & wild rice is OK),   - Avoid white potatoes  (sweet potatoes in moderation is OK),   White bread or wheat bread or anything made out of                                                                          white flour like bagels, donuts, rolls, buns, biscuits, cakes,  - pastries, cookies, pizza crust, and pasta (made from white flour & egg whites)   - vegetarian pasta or spinach or wheat pasta is OK.  - Multigrain breads like Arnold's, Pepperidge Farm or  multigrain sandwich thins or high fiber breads like   Eureka bread or "Dave's Killer" breads that are 4 to 5 grams fiber per slice !  are best.    Diet, exercise and weight loss can reverse and cure diabetes in the early stages.    - Diet, exercise and weight loss is very important in the   control and prevention of complications of diabetes which  affects every system in your body, ie.   -Brain - dementia/stroke,  - eyes - glaucoma/blindness,  - heart - heart attack/heart failure,  - kidneys - dialysis,  - stomach - gastric paralysis,  - intestines - malabsorption,  - nerves - severe painful neuritis,  - circulation - gangrene & loss of a leg(s)  - and finally  . . . . . . . . . . . . . . . . . .    - cancer and Alzheimers.  =========================================================================  -  Vitamin  D = 95 - Great - Please keep dose same  !  =========================================================================  -  All Else - CBC - Kidneys - Electrolytes - Liver - Magnesium & Thyroid    - all  Normal / OK  =========================================================================

## 2023-04-13 ENCOUNTER — Encounter: Payer: Medicare HMO | Admitting: Nurse Practitioner

## 2023-05-03 ENCOUNTER — Encounter: Payer: Self-pay | Admitting: Family Medicine

## 2023-05-03 ENCOUNTER — Ambulatory Visit (INDEPENDENT_AMBULATORY_CARE_PROVIDER_SITE_OTHER): Payer: Medicare HMO | Admitting: Family Medicine

## 2023-05-03 VITALS — BP 128/84 | HR 70 | Temp 97.3°F | Ht 68.0 in | Wt 149.0 lb

## 2023-05-03 DIAGNOSIS — Z9189 Other specified personal risk factors, not elsewhere classified: Secondary | ICD-10-CM

## 2023-05-03 DIAGNOSIS — R7303 Prediabetes: Secondary | ICD-10-CM | POA: Diagnosis not present

## 2023-05-03 DIAGNOSIS — M85852 Other specified disorders of bone density and structure, left thigh: Secondary | ICD-10-CM

## 2023-05-03 DIAGNOSIS — E559 Vitamin D deficiency, unspecified: Secondary | ICD-10-CM | POA: Diagnosis not present

## 2023-05-03 DIAGNOSIS — E2839 Other primary ovarian failure: Secondary | ICD-10-CM | POA: Diagnosis not present

## 2023-05-03 DIAGNOSIS — I10 Essential (primary) hypertension: Secondary | ICD-10-CM | POA: Diagnosis not present

## 2023-05-03 DIAGNOSIS — Z1231 Encounter for screening mammogram for malignant neoplasm of breast: Secondary | ICD-10-CM

## 2023-05-03 NOTE — Patient Instructions (Addendum)
  Thank you for trusting us  with your healthcare.   You may call [facility] to schedule your mammogram and bone density tests. [phone]  You are overdue for your Tdap vaccine. You can get this at your pharmacy.   Call and schedule with your gynecologist as discussed.     Recommend cutting your vitamin D supplement back to no more than 5,000 IUs and only 5 days per week.   Eat a diet low in saturated fat. Try to get at least 150 minutes of some sort of exercise each week.   We will discuss starting a medication to help lower your cholesterol at your next visit.

## 2023-05-03 NOTE — Progress Notes (Signed)
 New Patient Office Visit  Subjective    Patient ID: [name] [name], female    DOB: Sep 27, 1950  Age: 73 y.o. MRN: [id number]  CC:  Chief Complaint  Patient presents with   Establish Care    Resquesting mamogram in breast center    HPI [name] [name] presents to establish care Previous PCP: Dr. Oneta Rack  Last CPE: 04/13/2022  Other providers:  Orthopedist: Hampton Abbot for hip replacement   Would like to update mammogram. Last one in May 2024 at [facility]   PMH: HTN, HLD, prediabetes and vitamin D def.   Last labs in 01/2023  Vitamin D 95 and she is taking 5,000 to 10,000   LDL 105   A1c 5.9%   Tdap overdue   The 10-year ASCVD risk score (Arnett DK, et al., 2019) is: 15.2%   Values used to calculate the score:     Age: 73 years     Sex: Female     Is Non-Hispanic African American: No     Diabetic: No     Tobacco smoker: No     Systolic Blood Pressure: 128 mmHg     Is BP treated: Yes     HDL Cholesterol: 58 mg/dL     Total Cholesterol: 189 mg/dL   Outpatient Encounter Medications as of 05/03/2023  Medication Sig   acetaminophen (TYLENOL) 325 MG tablet Take 2 tablets (650 mg total) by mouth every 6 (six) hours as needed for mild pain (or Fever >/= 101).   amLODipine (NORVASC) 2.5 MG tablet Take  1 tablet  Daily  for BP                                /                                                                   TAKE                                         BY                                                 MOUTH                                            ? ? ? ONCE ? ? ?DAILY ? ? ?   Ascorbic Acid (VITAMIN C) 500 MG CAPS Take by mouth.   aspirin EC 81 MG tablet Take 81 mg by mouth every other day.    calcium carbonate (OS-CAL) 600 MG TABS tablet Take 600 mg by mouth daily with breakfast.   Cholecalciferol (VITAMIN D3) 125 MCG (5000 UT) CAPS Take 5,000 Units by mouth daily.   fish oil-omega-3 fatty acids 1000 MG capsule Take 1 g by mouth 3 (three)  times daily with  meals.    Flaxseed, Linseed, (FLAXSEED OIL PO) Take 1 capsule by mouth 3 (three) times daily with meals.    MAGNESIUM ASPARTATE PO Take by mouth.   Multiple Vitamins-Minerals (CENTRUM SILVER PO) Take by mouth.   Zinc 50 MG TABS Take 1/2 tablet  (25 mg)  Daily   hydrochlorothiazide (HYDRODIURIL) 25 MG tablet Take  1 tablet  Daily  for BP & Fluid Retention                                                                       /                                                                   TAKE                                         BY                                                 MOUTH (Patient not taking: Reported on 05/03/2023)   ibuprofen (ADVIL) 800 MG tablet TAKE 1 TABLET BY MOUTH EVERY 8 HOURS AS NEEDED (Patient not taking: Reported on 05/03/2023)   OVER THE COUNTER MEDICATION Takes Allergy meds as needed (Patient not taking: Reported on 02/01/2023)   No facility-administered encounter medications on file as of 05/03/2023.    Past Medical History:  Diagnosis Date   Abnormal Pap smear of cervix    Acute lower GI bleeding 02/16/2016   Allergy    Amoxicillin   Blood transfusion without reported diagnosis 2018   Heart murmur    Hyperlipidemia    Hypertension    Prediabetes    Seasonal allergies    Thyroiditis 07/22/2017   Vitamin D deficiency     Past Surgical History:  Procedure Laterality Date   CARDIAC SURGERY  1958   at age 73 for hole in heart   CERVIX LESION DESTRUCTION N/A ? years ago   with abnormal pap   COLONOSCOPY     polyps   COLONOSCOPY N/A 02/16/2016   Procedure: COLONOSCOPY;  Surgeon: Asencion Blacksmith, MD;  Location: [facility];  Service: Endoscopy;  Laterality: N/A;   CRYOTHERAPY     for abnormal pap smear   JOINT REPLACEMENT  01/29/2022   right hip replaced   TUBAL LIGATION      Family History  Problem Relation Age of Onset   Diabetes Father    Colon cancer Sister 23   Diabetes Sister    Miscarriages / Stillbirths Sister     Diabetes Sister    Diabetes Brother    Stomach cancer Neg Hx    Breast cancer Neg Hx    Esophageal cancer Neg Hx    Rectal cancer Neg Hx  Social History   Socioeconomic History   Marital status: Married    Spouse name: Not on file   Number of children: Not on file   Years of education: Not on file   Highest education level: 12th grade  Occupational History   Not on file  Tobacco Use   Smoking status: Never   Smokeless tobacco: Never  Vaping Use   Vaping status: Never Used  Substance and Sexual Activity   Alcohol use: Yes    Alcohol/week: 7.0 standard drinks of alcohol    Types: 7 Glasses of wine per week   Drug use: No   Sexual activity: Not Currently    Partners: Male    Birth control/protection: Surgical    Comment: BTL  Other Topics Concern   Not on file  Social History Narrative   Not on file   Social Drivers of Health   Financial Resource Strain: Low Risk  (05/02/2023)   Overall Financial Resource Strain (CARDIA)    Difficulty of Paying Living Expenses: Not hard at all  Food Insecurity: No Food Insecurity (05/02/2023)   Hunger Vital Sign    Worried About Running Out of Food in the Last Year: Never true    Ran Out of Food in the Last Year: Never true  Transportation Needs: No Transportation Needs (05/02/2023)   PRAPARE - Administrator, Civil Service (Medical): No    Lack of Transportation (Non-Medical): No  Physical Activity: Insufficiently Active (05/02/2023)   Exercise Vital Sign    Days of Exercise per Week: 2 days    Minutes of Exercise per Session: 10 min  Stress: No Stress Concern Present (05/02/2023)   [employer] of Occupational Health - Occupational Stress Questionnaire    Feeling of Stress : Only a little  Social Connections: Moderately Isolated (05/02/2023)   Social Connection and Isolation Panel [NHANES]    Frequency of Communication with Friends and Family: More than three times a week    Frequency of Social Gatherings with  Friends and Family: Three times a week    Attends Religious Services: Never    Active Member of Clubs or Organizations: No    Attends Engineer, structural: Not on file    Marital Status: Married  Catering manager Violence: Not on file    Review of Systems  Constitutional:  Negative for chills, fever and malaise/fatigue.  Respiratory:  Negative for shortness of breath.   Cardiovascular:  Negative for chest pain, palpitations and leg swelling.  Gastrointestinal:  Negative for abdominal pain, constipation, diarrhea, nausea and vomiting.  Genitourinary:  Negative for dysuria, frequency and urgency.  Musculoskeletal:  Negative for joint pain and myalgias.  Neurological:  Negative for dizziness and focal weakness.  Psychiatric/Behavioral:  Negative for depression. The patient is not nervous/anxious.         Objective    BP 128/84 (BP Location: Left Arm, Patient Position: Sitting)   Pulse 70   Temp (!) 97.3 F (36.3 C) (Temporal)   Ht 5\' 8"  (1.727 m)   Wt 149 lb (67.6 kg)   LMP 11/17/2002   SpO2 96%   BMI 22.66 kg/m   Physical Exam Constitutional:      General: She is not in acute distress.    Appearance: She is not ill-appearing.  HENT:     Mouth/Throat:     Mouth: Mucous membranes are moist.  Eyes:     Extraocular Movements: Extraocular movements intact.     Conjunctiva/sclera: Conjunctivae normal.  Cardiovascular:     Rate and Rhythm: Normal rate and regular rhythm.  Pulmonary:     Effort: Pulmonary effort is normal.     Breath sounds: Normal breath sounds.  Musculoskeletal:     Cervical back: Normal range of motion and neck supple.     Right lower leg: No edema.     Left lower leg: No edema.  Skin:    General: Skin is warm and dry.  Neurological:     General: No focal deficit present.     Mental Status: She is alert and oriented to person, place, and time.     Motor: No weakness.     Coordination: Coordination normal.     Gait: Gait normal.   Psychiatric:        Mood and Affect: Mood normal.        Behavior: Behavior normal.        Thought Content: Thought content normal.         Assessment & Plan:   Problem List Items Addressed This Visit     Hypertension - Primary   Osteopenia of left femoral neck   Relevant Orders   DG Bone Density   Vitamin D deficiency   Other Visit Diagnoses       Risk of myocardial infarction or stroke 7.5% or greater in next 10 years         Estrogen deficiency       Relevant Orders   DG Bone Density     Prediabetes         Screening mammogram for breast cancer       Relevant Orders   MM 3D SCREENING MAMMOGRAM BILATERAL BREAST      She is a pleasant 73 year old female who is new to the practice here to establish care. Reviewed previous PCP notes and lab results. Mammogram and bone density ordered.  She is getting adequate calcium, vitamin D and weightbearing exercise. She is not on statin therapy and we discussed increased ASCVD risk is >7.5%. she will consider starting a statin.  Reduce vitamin D intake slightly, 5,000 IUs daily x 5 days/wk Prediabetes and HTN controlled.  Follow up for fasting CPE in 2 months.  Recommend Tdap at her pharmacy.    Return in about 2 months (around 07/03/2023) for fasting CPE.   Alyson Back, NP-C

## 2023-05-04 ENCOUNTER — Ambulatory Visit: Payer: Medicare HMO | Admitting: Nurse Practitioner

## 2023-05-17 ENCOUNTER — Ambulatory Visit: Payer: Self-pay

## 2023-05-17 NOTE — Telephone Encounter (Signed)
  Chief Complaint: tick bite Symptoms: red ring around bite Frequency: yesterday  Pertinent Negatives: Patient denies fever, joint pain, rash  Disposition: [] ED /[] Urgent Care (no appt availability in office) / [x] Appointment(In office/virtual)/ []  [facility]/ [] Home Care/ [] Refused Recommended Disposition /[] [facility]/ []  Follow-up with PCP Additional Notes: no appts with PCP avaialble made appt with another provider.  Copied from CRM [id number]. Topic: Clinical - Red Word Triage >> May 17, 2023  2:45 PM Elle L wrote: Red Word that prompted transfer to Nurse Triage: The patient found a tick on her yesterday and she found on Google that it is a lone star tick. The area where the tick bit her is red and inflamed. Reason for Disposition  Red ring or bull's-eye rash occurs at tick bite  Answer Assessment - Initial Assessment Questions 1. ATTACHED:  "Is the tick still on the skin?"  (e.g., yes, no, unsure)     no 2. ONSET - TICK STILL ATTACHED:  "How long do you think the tick has been on your skin?" (e.g., hours, days, unsure)  Note:  Is there a recent activity (camping, hiking) where the caller may have been exposed?     N/a 3. ONSET - TICK NOT STILL ATTACHED: "If the tick has been removed, how long do you think the tick was attached before you removed it?" (e.g., 5 hours, 2 days). "When was this?"     Unsure  4. LOCATION: "Where is the tick bite located?" (e.g., arm, leg)      Right  inner thigh 5. TYPE of TICK: "Is it a wood tick or a deer tick?" (e.g., deer tick, wood tick; unsure)     Lone start 6. SIZE of TICK: "How big is the tick?" (e.g., size of poppy seed, apple seed, watermelon seed; unsure) Note: Deer ticks can be the size of a poppy seed (nymph) or an apple seed (adult).       Poppy seed - saw white dot [place] 7. ENGORGED: "Did the tick look flat or engorged (full, swollen)?" (e.g., flat, engorged; unsure)     Flat  8. OTHER SYMPTOMS: "Do you have any  other symptoms?" (e.g., fever, rash, redness at bite area, red ring around bite)     Bullseye lesions, redness at the bite rering around bite  Protocols used: Tick Bite-A-AH

## 2023-05-18 ENCOUNTER — Ambulatory Visit (INDEPENDENT_AMBULATORY_CARE_PROVIDER_SITE_OTHER): Admitting: Emergency Medicine

## 2023-05-18 ENCOUNTER — Encounter: Payer: Self-pay | Admitting: Emergency Medicine

## 2023-05-18 VITALS — BP 154/78 | HR 74 | Temp 98.7°F | Ht 68.0 in | Wt 148.0 lb

## 2023-05-18 DIAGNOSIS — W57XXXA Bitten or stung by nonvenomous insect and other nonvenomous arthropods, initial encounter: Secondary | ICD-10-CM | POA: Insufficient documentation

## 2023-05-18 DIAGNOSIS — L089 Local infection of the skin and subcutaneous tissue, unspecified: Secondary | ICD-10-CM | POA: Diagnosis not present

## 2023-05-18 DIAGNOSIS — S70361A Insect bite (nonvenomous), right thigh, initial encounter: Secondary | ICD-10-CM

## 2023-05-18 MED ORDER — DOXYCYCLINE HYCLATE 100 MG PO TABS
100.0000 mg | ORAL_TABLET | Freq: Two times a day (BID) | ORAL | 0 refills | Status: AC
Start: 2023-05-18 — End: 2023-05-28

## 2023-05-18 NOTE — Progress Notes (Signed)
 [name] [name] 73 y.o.   Chief Complaint  Patient presents with   Tick Removal    Patient states she found the tick Monday and she removed it herself. The triage RN told patient to circle the site to see if it worsen. She did bring the tick with her. Patient mentions on the way here she felt her heart racing but no other symptoms.     HISTORY OF PRESENT ILLNESS: Acute problem visit today. This is a 73 y.o. female complaining of tick bite sustained probably last Monday She removed the tick Monday Today complaining of redness around the tick bite area No other complaints or any other medical concerns  HPI   Prior to Admission medications   Medication Sig Start Date End Date Taking? Authorizing Provider  acetaminophen  (TYLENOL ) 325 MG tablet Take 2 tablets (650 mg total) by mouth every 6 (six) hours as needed for mild pain (or Fever >/= 101). 02/18/16  Yes Regalado, Belkys A, MD  amLODipine  (NORVASC ) 2.5 MG tablet Take  1 tablet  Daily  for BP                                /                                                                   TAKE                                         BY                                                 MOUTH                                            ? ? ? ONCE ? ? ?DAILY ? ? ? 12/15/22  Yes Vangie Genet, MD  Ascorbic Acid (VITAMIN C) 500 MG CAPS Take by mouth.   Yes [provider]  aspirin EC 81 MG tablet Take 81 mg by mouth every other day.    Yes [provider]  calcium carbonate (OS-CAL) 600 MG TABS tablet Take 600 mg by mouth daily with breakfast.   Yes [provider]  Cholecalciferol (VITAMIN D3) 125 MCG (5000 UT) CAPS Take 5,000 Units by mouth daily.   Yes [provider]  fish oil-omega-3 fatty acids 1000 MG capsule Take 1 g by mouth 3 (three) times daily with meals.    Yes [provider]  Flaxseed, Linseed, (FLAXSEED OIL PO) Take 1 capsule by mouth 3 (three) times daily with meals.    Yes  [provider]  MAGNESIUM ASPARTATE PO Take by mouth.   Yes [provider]  Multiple Vitamins-Minerals (CENTRUM SILVER PO) Take by mouth.   Yes [provider]  Zinc  50 MG TABS Take 1/2 tablet  (  25 mg)  Daily 07/15/22  Yes Vangie Genet, MD  OVER THE COUNTER MEDICATION Takes Allergy meds as needed Patient not taking: Reported on 10/08/2022    [provider]    Allergies  Allergen Reactions   Amoxil [Amoxicillin] Rash   Ampicillin Rash    Patient Active Problem List   Diagnosis Date Noted   Abnormal glucose 08/20/2018   History of thyroiditis 03/26/2018   Osteopenia of left femoral neck 03/23/2018   Cystocele with prolapse 03/23/2018   BMI 22.0-22.9, adult 06/01/2017   Medication management 07/26/2014   Hyperlipidemia, mixed    Seasonal allergies    Vitamin D  deficiency    Hypertension    History of colonic polyps 03/23/2011    Past Medical History:  Diagnosis Date   Abnormal Pap smear of cervix    Acute lower GI bleeding 02/16/2016   Allergy    Amoxicillin   Blood transfusion without reported diagnosis 2018   Heart murmur    Hyperlipidemia    Hypertension    Prediabetes    Seasonal allergies    Thyroiditis 07/22/2017   Vitamin D  deficiency     Past Surgical History:  Procedure Laterality Date   CARDIAC SURGERY  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 years ago   with abnormal pap   COLONOSCOPY     polyps   COLONOSCOPY N/A 02/16/2016   Procedure: COLONOSCOPY;  Surgeon: Asencion Blacksmith, MD;  Location: [facility];  Service: Endoscopy;  Laterality: N/A;   CRYOTHERAPY     for abnormal pap smear   JOINT REPLACEMENT  01/29/2022   right hip replaced   TUBAL LIGATION      Social History   Socioeconomic History   Marital status: Married    Spouse name: Not on file   Number of children: Not on file   Years of education: Not on file   Highest education level: 12th grade  Occupational History    Not on file  Tobacco Use   Smoking status: Never   Smokeless tobacco: Never  Vaping Use   Vaping status: Never Used  Substance and Sexual Activity   Alcohol use: Yes    Alcohol/week: 7.0 standard drinks of alcohol    Types: 7 Glasses of wine per week   Drug use: No   Sexual activity: Not Currently    Partners: Male    Birth control/protection: Surgical    Comment: BTL  Other Topics Concern   Not on file  Social History Narrative   Not on file   Social Drivers of Health   Financial Resource Strain: Low Risk  (05/02/2023)   Overall Financial Resource Strain (CARDIA)    Difficulty of Paying Living Expenses: Not hard at all  Food Insecurity: No Food Insecurity (05/02/2023)   Hunger Vital Sign    Worried About Running Out of Food in the Last Year: Never true    Ran Out of Food in the Last Year: Never true  Transportation Needs: No Transportation Needs (05/02/2023)   PRAPARE - Administrator, Civil Service (Medical): No    Lack of Transportation (Non-Medical): No  Physical Activity: Insufficiently Active (05/02/2023)   Exercise Vital Sign    Days of Exercise per Week: 2 days    Minutes of Exercise per Session: 10 min  Stress: No Stress Concern Present (05/02/2023)   [employer] of Occupational Health - Occupational Stress Questionnaire    Feeling of  Stress : Only a little  Social Connections: Moderately Isolated (05/02/2023)   Social Connection and Isolation Panel [NHANES]    Frequency of Communication with Friends and Family: More than three times a week    Frequency of Social Gatherings with Friends and Family: Three times a week    Attends Religious Services: Never    Active Member of Clubs or Organizations: No    Attends Engineer, structural: Not on file    Marital Status: Married  Catering manager Violence: Not on file    Family History  Problem Relation Age of Onset   Diabetes Father    Colon cancer Sister 24   Diabetes Sister     Miscarriages / Stillbirths Sister    Diabetes Sister    Diabetes Brother    Stomach cancer Neg Hx    Breast cancer Neg Hx    Esophageal cancer Neg Hx    Rectal cancer Neg Hx      Review of Systems  Constitutional: Negative.  Negative for chills and fever.  HENT: Negative.  Negative for congestion and sore throat.   Respiratory: Negative.  Negative for cough and shortness of breath.   Cardiovascular: Negative.  Negative for chest pain and palpitations.  Gastrointestinal:  Negative for nausea and vomiting.  Genitourinary: Negative.  Negative for dysuria and hematuria.  Skin:  Positive for rash.  Neurological: Negative.  Negative for dizziness and headaches.  All other systems reviewed and are negative.   Vitals:   05/18/23 1425  BP: (!) 154/78  Pulse: 74  Temp: 98.7 F (37.1 C)  SpO2: 94%    Physical Exam Vitals reviewed.  Constitutional:      Appearance: Normal appearance.  HENT:     Head: Normocephalic.  Eyes:     Extraocular Movements: Extraocular movements intact.  Cardiovascular:     Rate and Rhythm: Normal rate.  Pulmonary:     Effort: Pulmonary effort is normal.  Skin:    General: Skin is warm and dry.     Findings: Rash present.  Neurological:     Mental Status: She is alert and oriented to person, place, and time.  Psychiatric:        Mood and Affect: Mood normal.        Behavior: Behavior normal.      ASSESSMENT & PLAN: A total of 32 minutes was spent with the patient and counseling/coordination of care regarding preparing for this visit, review of most recent office visit notes, review of chronic medical conditions under management, review of all medications, diagnosis of infected tick bite wound and need for antibiotics, prognosis, documentation, and need for follow-up if no better or worse for the next several days.  Problem List Items Addressed This Visit       Musculoskeletal and Integument   Tick bite of right thigh - Primary   Tick removed  2 days ago Uncertain how long it was attached Tick brought in by patient.  Looks a little engorged. No systemic symptoms Recommend to start doxycycline 100 mg twice a day for 10 days      Relevant Medications   doxycycline (VIBRA-TABS) 100 MG tablet   Skin infection   Wound care instructions given Recommend to start doxycycline 100 mg twice a day for 10 days Possibility of Lyme disease discussed with patient      Relevant Medications   doxycycline (VIBRA-TABS) 100 MG tablet   Patient Instructions  Tick Bite Information, Adult  Ticks are insects that  can bite. Most ticks live in bushes and grassy areas. They climb onto people and animals that go by. Then they bite. Some ticks carry germs that can make you sick. How can I prevent tick bites? Take these steps: Before you go outside: Wear long sleeves and long pants. Wear light-colored clothes. Tuck your pant legs into your socks. Use an insect repellent that has 20% or higher of the ingredients DEET, picaridin, or IR3535. Follow the instructions on the label. Put it on: Bare skin. Avoid your eyes and mouth areas. The tops of your boots. Your pant legs. The ends of your sleeves. If you use an insect repellent that has the ingredient permethrin, follow the instructions on the label. Do not put permethrin on the skin. Put it on: Clothing. Shoes. Outdoor gear. Tents. When you are outside Avoid walking through long grass. Stay in the middle of the trail. Do not touch the bushes. Check for ticks on your clothes, hair, and skin often while you are outside. Check again before you go inside. When you go indoors Check your clothes for ticks. Dry your clothes in a dryer on high heat for 10 minutes or more. If clothes are damp, more time may be needed. Wash your clothes right away if they need to be washed. Use hot water. Check your pets and outdoor gear. Shower right away. Check your body for ticks. Do a full body check using a  mirror. Check your clothes, skin, head, neck, armpits, waist, groin, and joint areas. What is the right way to remove a tick? Remove the tick from your skin as soon as possible. Do not remove the tick with your bare fingers. Do not try to remove a tick with heat, alcohol, petroleum jelly, or fingernail polish. To remove a tick that is crawling on your skin: Go outside and brush the tick off. Use tape or a lint roller. To remove a tick that is biting: Wash your hands. If you have gloves, put them on. Use tweezers, curved forceps, or a tick-removal tool to grasp the tick. Grasp the tick as close to your skin and as close to the tick's head as possible. Gently pull up until the tick lets go. Try to keep the tick's head attached to its body. Do not twist or jerk the tick. Do not squeeze or crush the tick. What should I do after taking out a tick? Clean the bite area and your hands with soap and water, rubbing alcohol, or an iodine wash. If you have an antiseptic cream or ointment, put a small amount on the bite area. Wash and disinfect any instruments that you used to remove the tick. How should I get rid of a live tick? To get rid of a live tick, use one of these methods: Place the tick in rubbing alcohol. Place it in a bag or container you can close tightly. Throw it away. Wrap it tightly in tape. Throw it away. Flush it down the toilet. Where to find more information Centers for Disease Control and Prevention: GyrateAtrophy.si U.S. Environmental Protection Agency: RelocationNetworking.fi Contact a doctor if: You have a fever or chills. You have a red rash that makes a circle (bull's-eye rash) in the bite area. You have redness and swelling where the tick bit you. You have a headache or stiff neck. You have pain in a muscle, joint, or bone. You are more tired than normal. You have trouble walking or moving your legs. You have numbness in your legs.  You have tender or swollen lymph  glands. You have belly (abdominal) pain, vomiting, watery poop (diarrhea), or weight loss. Get help right away if: You cannot remove a tick. You cannot move (have paralysis) or feel weak. You are feeling worse or have new symptoms. You find a tick that is biting you and filled with blood, especially if you are in an area where diseases from ticks are common. Summary Ticks may carry germs that can make you sick. To prevent tick bites wear long sleeves, long pants, and light colors. Use insect repellent. Follow the instructions on the label. If the tick is biting, remove it right away. Do not try to remove it with heat, alcohol, petroleum jelly, or fingernail polish. Contact a doctor if you have symptoms of a disease after being bitten by a tick. This information is not intended to replace advice given to you by your health care provider. Make sure you discuss any questions you have with your health care provider. Document Revised: 04/06/2021 Document Reviewed: 04/06/2021 Elsevier Patient Education  2024 Elsevier Inc.    Maryagnes Small, MD [facility]

## 2023-05-18 NOTE — Patient Instructions (Signed)
Tick Bite Information, Adult  Ticks are insects that can bite. Most ticks live in bushes and grassy areas. They climb onto people and animals that go by. Then they bite. Some ticks carry germs that can make you sick. How can I prevent tick bites? Take these steps: Before you go outside: Wear long sleeves and long pants. Wear light-colored clothes. Tuck your pant legs into your socks. Use an insect repellent that has 20% or higher of the ingredients DEET, picaridin, or IR3535. Follow the instructions on the label. Put it on: Bare skin. Avoid your eyes and mouth areas. The tops of your boots. Your pant legs. The ends of your sleeves. If you use an insect repellent that has the ingredient permethrin, follow the instructions on the label. Do not put permethrin on the skin. Put it on: Clothing. Shoes. Outdoor gear. Tents. When you are outside Avoid walking through long grass. Stay in the middle of the trail. Do not touch the bushes. Check for ticks on your clothes, hair, and skin often while you are outside. Check again before you go inside. When you go indoors Check your clothes for ticks. Dry your clothes in a dryer on high heat for 10 minutes or more. If clothes are damp, more time may be needed. Wash your clothes right away if they need to be washed. Use hot water. Check your pets and outdoor gear. Shower right away. Check your body for ticks. Do a full body check using a mirror. Check your clothes, skin, head, neck, armpits, waist, groin, and joint areas. What is the right way to remove a tick? Remove the tick from your skin as soon as possible. Do not remove the tick with your bare fingers. Do not try to remove a tick with heat, alcohol, petroleum jelly, or fingernail polish. To remove a tick that is crawling on your skin: Go outside and brush the tick off. Use tape or a lint roller. To remove a tick that is biting: Wash your hands. If you have gloves, put them on. Use tweezers,  curved forceps, or a tick-removal tool to grasp the tick. Grasp the tick as close to your skin and as close to the tick's head as possible. Gently pull up until the tick lets go. Try to keep the tick's head attached to its body. Do not twist or jerk the tick. Do not squeeze or crush the tick. What should I do after taking out a tick? Clean the bite area and your hands with soap and water, rubbing alcohol, or an iodine wash. If you have an antiseptic cream or ointment, put a small amount on the bite area. Wash and disinfect any instruments that you used to remove the tick. How should I get rid of a live tick? To get rid of a live tick, use one of these methods: Place the tick in rubbing alcohol. Place it in a bag or container you can close tightly. Throw it away. Wrap it tightly in tape. Throw it away. Flush it down the toilet. Where to find more information Centers for Disease Control and Prevention: cdc.gov/ticks U.S. Environmental Protection Agency: epa.gov/insect-repellents Contact a doctor if: You have a fever or chills. You have a red rash that makes a circle (bull's-eye rash) in the bite area. You have redness and swelling where the tick bit you. You have a headache or stiff neck. You have pain in a muscle, joint, or bone. You are more tired than normal. You have trouble walking or   moving your legs. You have numbness in your legs. You have tender or swollen lymph glands. You have belly (abdominal) pain, vomiting, watery poop (diarrhea), or weight loss. Get help right away if: You cannot remove a tick. You cannot move (have paralysis) or feel weak. You are feeling worse or have new symptoms. You find a tick that is biting you and filled with blood, especially if you are in an area where diseases from ticks are common. Summary Ticks may carry germs that can make you sick. To prevent tick bites wear long sleeves, long pants, and light colors. Use insect repellent. Follow the  instructions on the label. If the tick is biting, remove it right away. Do not try to remove it with heat, alcohol, petroleum jelly, or fingernail polish. Contact a doctor if you have symptoms of a disease after being bitten by a tick. This information is not intended to replace advice given to you by your health care provider. Make sure you discuss any questions you have with your health care provider. Document Revised: 04/06/2021 Document Reviewed: 04/06/2021 Elsevier Patient Education  2024 Elsevier Inc.  

## 2023-05-18 NOTE — Assessment & Plan Note (Signed)
 Tick removed 2 days ago Uncertain how long it was attached Tick brought in by patient.  Looks a little engorged. No systemic symptoms Recommend to start doxycycline 100 mg twice a day for 10 days

## 2023-05-18 NOTE — Assessment & Plan Note (Signed)
 Wound care instructions given Recommend to start doxycycline 100 mg twice a day for 10 days Possibility of Lyme disease discussed with patient

## 2023-05-24 ENCOUNTER — Ambulatory Visit
Admission: RE | Admit: 2023-05-24 | Discharge: 2023-05-24 | Disposition: A | Discharge status: Home / Self Care | Source: Ambulatory Visit | Attending: Family Medicine | Admitting: Family Medicine

## 2023-05-24 DIAGNOSIS — Z1231 Encounter for screening mammogram for malignant neoplasm of breast: Secondary | ICD-10-CM | POA: Diagnosis not present

## 2023-06-16 ENCOUNTER — Telehealth: Payer: Self-pay | Admitting: Family Medicine

## 2023-06-16 NOTE — Telephone Encounter (Signed)
 Copied from CRM [id number]. Topic: Clinical - Prescription Issue >> Jun 16, 2023 10:11 AM Magdalene School wrote: Reason for CRM: Patient stated she went to [facility] pharmacy to pick up prescription but they need the provider's NPI number. Patient did not have a pen to write the number with and would like someone to call the pharmacy to provide Alyson Back NP NPI number which is [id number].

## 2023-06-16 NOTE — Telephone Encounter (Signed)
 We did not send in any medication for this patient.

## 2023-06-20 ENCOUNTER — Telehealth: Payer: Self-pay | Admitting: Family Medicine

## 2023-06-20 DIAGNOSIS — I10 Essential (primary) hypertension: Secondary | ICD-10-CM

## 2023-06-20 MED ORDER — AMLODIPINE BESYLATE 2.5 MG PO TABS: ORAL_TABLET | ORAL | 1 refills | Status: DC

## 2023-06-20 NOTE — Telephone Encounter (Signed)
 Rx sent.

## 2023-06-20 NOTE — Telephone Encounter (Signed)
 Copied from CRM [id number]. Topic: Clinical - Medication Question >> Jun 17, 2023  3:32 PM [name] [name] wrote: Reason for CRM: Patient is calling to see if her amLODipine  (NORVASC ) 2.5 MG tablet  Can be sent over. She has one pill left and her appointment is not until June 19th

## 2023-07-05 DIAGNOSIS — H40013 Open angle with borderline findings, low risk, bilateral: Secondary | ICD-10-CM | POA: Diagnosis not present

## 2023-07-05 DIAGNOSIS — H2513 Age-related nuclear cataract, bilateral: Secondary | ICD-10-CM | POA: Diagnosis not present

## 2023-07-07 ENCOUNTER — Ambulatory Visit: Payer: Self-pay | Admitting: Family Medicine

## 2023-07-07 ENCOUNTER — Encounter: Payer: Self-pay | Admitting: Family Medicine

## 2023-07-07 ENCOUNTER — Ambulatory Visit: Admitting: Family Medicine

## 2023-07-07 VITALS — BP 138/80 | HR 60 | Temp 98.0°F | Ht 68.0 in | Wt 143.6 lb

## 2023-07-07 DIAGNOSIS — E782 Mixed hyperlipidemia: Secondary | ICD-10-CM

## 2023-07-07 DIAGNOSIS — I10 Essential (primary) hypertension: Secondary | ICD-10-CM

## 2023-07-07 DIAGNOSIS — E559 Vitamin D deficiency, unspecified: Secondary | ICD-10-CM

## 2023-07-07 DIAGNOSIS — R7303 Prediabetes: Secondary | ICD-10-CM | POA: Diagnosis not present

## 2023-07-07 LAB — COMPREHENSIVE METABOLIC PANEL WITH GFR
ALT: 15 U/L (ref 0–35)
AST: 21 U/L (ref 0–37)
Albumin: 4.6 g/dL (ref 3.5–5.2)
Alkaline Phosphatase: 45 U/L (ref 39–117)
BUN: 14 mg/dL (ref 6–23)
CO2: 31 meq/L (ref 19–32)
Calcium: 9.8 mg/dL (ref 8.4–10.5)
Chloride: 101 meq/L (ref 96–112)
Creatinine, Ser: 0.6 mg/dL (ref 0.40–1.20)
GFR: 89.47 mL/min (ref >60.00)
Glucose, Bld: 95 mg/dL (ref 70–99)
Potassium: 3.7 meq/L (ref 3.5–5.1)
Sodium: 139 meq/L (ref 135–145)
Total Bilirubin: 0.8 mg/dL (ref 0.2–1.2)
Total Protein: 7.6 g/dL (ref 6.0–8.3)

## 2023-07-07 LAB — CBC WITH DIFFERENTIAL/PLATELET
Basophils Absolute: 0 10*3/uL (ref 0.0–0.1)
Basophils Relative: 0.7 % (ref 0.0–3.0)
Eosinophils Absolute: 0 10*3/uL (ref 0.0–0.7)
Eosinophils Relative: 1.1 % (ref 0.0–5.0)
HCT: 39.2 % (ref 36.0–46.0)
Hemoglobin: 12.9 g/dL (ref 12.0–15.0)
Lymphocytes Relative: 29.7 % (ref 12.0–46.0)
Lymphs Abs: 1.4 10*3/uL (ref 0.7–4.0)
MCHC: 32.8 g/dL (ref 30.0–36.0)
MCV: 91 fl (ref 78.0–100.0)
Monocytes Absolute: 0.3 10*3/uL (ref 0.1–1.0)
Monocytes Relative: 6.5 % (ref 3.0–12.0)
Neutro Abs: 2.9 10*3/uL (ref 1.4–7.7)
Neutrophils Relative %: 62 % (ref 43.0–77.0)
Platelets: 287 10*3/uL (ref 150.0–400.0)
RBC: 4.31 Mil/uL (ref 3.87–5.11)
RDW: 13.2 % (ref 11.5–15.5)
WBC: 4.7 10*3/uL (ref 4.0–10.5)

## 2023-07-07 LAB — LIPID PANEL
Cholesterol: 181 mg/dL (ref 0–200)
HDL: 63.4 mg/dL (ref >39.00)
LDL Cholesterol: 103 mg/dL — ABNORMAL HIGH (ref 0–99)
NonHDL: 117.44
Total CHOL/HDL Ratio: 3
Triglycerides: 74 mg/dL (ref 0.0–149.0)
VLDL: 14.8 mg/dL (ref 0.0–40.0)

## 2023-07-07 LAB — HEMOGLOBIN A1C: Hgb A1c MFr Bld: 5.9 % (ref 4.6–6.5)

## 2023-07-07 LAB — VITAMIN D 25 HYDROXY (VIT D DEFICIENCY, FRACTURES): VITD: 76.2 ng/mL (ref 30.00–100.00)

## 2023-07-07 LAB — TSH: TSH: 2.16 u[IU]/mL (ref 0.35–5.50)

## 2023-07-07 MED ORDER — ROSUVASTATIN CALCIUM 5 MG PO TABS
5.0000 mg | ORAL_TABLET | Freq: Every day | ORAL | 1 refills | Status: DC
Start: 2023-07-07 — End: 2023-12-07

## 2023-07-07 NOTE — Assessment & Plan Note (Signed)
 Continue limiting sugar and carbohydrates and getting adequate exercise.  Check A1c and follow-up

## 2023-07-07 NOTE — Patient Instructions (Signed)
 Please go downstairs for labs before you leave.  Continue your current medications.  As discussed, I we will send in a low-dose cholesterol medication once I have your cholesterol numbers today.  I will see you back for a preventive healthcare visit in 3 months.

## 2023-07-07 NOTE — Assessment & Plan Note (Signed)
Check vitamin D level and adjust supplemental dose as appropriate.  

## 2023-07-07 NOTE — Assessment & Plan Note (Signed)
 Start Crestor 5 mg daily.  Recommend low-fat, low-cholesterol diet and getting adequate exercise.  The 10-year ASCVD risk score (Arnett DK, et al., 2019) is: 17.2%   Values used to calculate the score:     Age: 73 years     Clincally relevant sex: Female     Is Non-Hispanic African American: No     Diabetic: No     Tobacco smoker: No     Systolic Blood Pressure: 138 mmHg     Is BP treated: Yes     HDL Cholesterol: 63.4 mg/dL     Total Cholesterol: 181 mg/dL

## 2023-07-07 NOTE — Progress Notes (Signed)
 Subjective:     Patient ID: [name] [name], female    DOB: Nov 21, 1950, 73 y.o.   MRN: [id number]  Chief Complaint  Patient presents with   Follow-up    HPI  History of Present Illness         She is here to follow-up on chronic health conditions and for labs.  She is fasting.  Reports taking amlodipine  2.5 mg daily.  States blood pressures at home are in normal range.  She is eating a healthy diet and being active.  She has lost a few pounds.  No concerns today.  She took her first Shingrix vaccine. Does not plan to get the 2nd one.   She is retired.  She used to be an Conservator, museum/gallery for [employer]  Married.  2 children and 2 grandchildren.      Health Maintenance Due  Topic Date Due   COVID-19 Vaccine (1) Never done   Zoster Vaccines- Shingrix (1 of 2) 10/08/1969    Past Medical History:  Diagnosis Date   Abnormal Pap smear of cervix    Acute lower GI bleeding 02/16/2016   Allergy    Amoxicillin   Blood transfusion without reported diagnosis 2018   Heart murmur    Hyperlipidemia    Hypertension    Prediabetes    Seasonal allergies    Thyroiditis 07/22/2017   Vitamin D  deficiency     Past Surgical History:  Procedure Laterality Date   CARDIAC SURGERY  1958   at age 2 for hole in heart   CERVIX LESION DESTRUCTION N/A ? years ago   with abnormal pap   COLONOSCOPY     polyps   COLONOSCOPY N/A 02/16/2016   Procedure: COLONOSCOPY;  Surgeon: Asencion Blacksmith, MD;  Location: [facility];  Service: Endoscopy;  Laterality: N/A;   CRYOTHERAPY     for abnormal pap smear   JOINT REPLACEMENT  01/29/2022   right hip replaced   TUBAL LIGATION      Family History  Problem Relation Age of Onset   Diabetes Father    Colon cancer Sister 41   Diabetes Sister    Miscarriages / Stillbirths Sister    Diabetes Sister    Diabetes Brother    Stomach cancer Neg Hx    Breast cancer Neg Hx    Esophageal cancer Neg Hx    Rectal cancer Neg Hx    BRCA 1/2 Neg Hx      Social History   Socioeconomic History   Marital status: Married    Spouse name: Not on file   Number of children: Not on file   Years of education: Not on file   Highest education level: 12th grade  Occupational History   Not on file  Tobacco Use   Smoking status: Never   Smokeless tobacco: Never  Vaping Use   Vaping status: Never Used  Substance and Sexual Activity   Alcohol use: Yes    Alcohol/week: 7.0 standard drinks of alcohol    Types: 7 Glasses of wine per week   Drug use: No   Sexual activity: Not Currently    Partners: Male    Birth control/protection: Surgical    Comment: BTL  Other Topics Concern   Not on file  Social History Narrative   Not on file   Social Drivers of Health   Financial Resource Strain: Low Risk  (07/05/2023)   Overall Financial Resource Strain (CARDIA)    Difficulty of Paying Living Expenses: Not  hard at all  Food Insecurity: No Food Insecurity (07/05/2023)   Hunger Vital Sign    Worried About Running Out of Food in the Last Year: Never true    Ran Out of Food in the Last Year: Never true  Transportation Needs: No Transportation Needs (07/05/2023)   PRAPARE - Administrator, Civil Service (Medical): No    Lack of Transportation (Non-Medical): No  Physical Activity: Insufficiently Active (07/05/2023)   Exercise Vital Sign    Days of Exercise per Week: 3 days    Minutes of Exercise per Session: 30 min  Stress: No Stress Concern Present (07/05/2023)   [employer] of Occupational Health - Occupational Stress Questionnaire    Feeling of Stress: Not at all  Social Connections: Moderately Isolated (07/05/2023)   Social Connection and Isolation Panel    Frequency of Communication with Friends and Family: More than three times a week    Frequency of Social Gatherings with Friends and Family: Once a week    Attends Religious Services: Never    Database administrator or Organizations: No    Attends Hospital doctor: Not on file    Marital Status: Married  Catering manager Violence: Not on file    Outpatient Medications Prior to Visit  Medication Sig Dispense Refill   acetaminophen  (TYLENOL ) 325 MG tablet Take 2 tablets (650 mg total) by mouth every 6 (six) hours as needed for mild pain (or Fever >/= 101). 30 tablet 0   amLODipine  (NORVASC ) 2.5 MG tablet Take 1 tablet daily for BP 90 tablet 1   Ascorbic Acid (VITAMIN C) 500 MG CAPS Take by mouth.     aspirin EC 81 MG tablet Take 81 mg by mouth every other day.      calcium carbonate (OS-CAL) 600 MG TABS tablet Take 600 mg by mouth daily with breakfast.     Cholecalciferol (VITAMIN D3) 125 MCG (5000 UT) CAPS Take 5,000 Units by mouth daily.     fish oil-omega-3 fatty acids 1000 MG capsule Take 1 g by mouth 3 (three) times daily with meals.      Flaxseed, Linseed, (FLAXSEED OIL PO) Take 1 capsule by mouth 3 (three) times daily with meals.      Multiple Vitamins-Minerals (CENTRUM SILVER PO) Take by mouth.     OVER THE COUNTER MEDICATION Takes Allergy meds as needed     Zinc  50 MG TABS Take 1/2 tablet  (25 mg)  Daily  0   MAGNESIUM ASPARTATE PO Take by mouth. (Patient not taking: Reported on 07/07/2023)     No facility-administered medications prior to visit.    Allergies  Allergen Reactions   Amoxil [Amoxicillin] Rash   Ampicillin Rash    Review of Systems  Constitutional:  Negative for chills, fever and malaise/fatigue.  HENT:  Negative for congestion, ear pain and sore throat.   Eyes:  Negative for blurred vision, double vision and photophobia.  Respiratory:  Negative for cough and shortness of breath.   Cardiovascular:  Negative for chest pain, palpitations and leg swelling.  Gastrointestinal:  Negative for abdominal pain, constipation, diarrhea, nausea and vomiting.  Genitourinary:  Negative for dysuria, frequency and urgency.  Musculoskeletal:  Negative for falls, joint pain and myalgias.  Neurological:  Negative for dizziness,  focal weakness and headaches.  Psychiatric/Behavioral:  Negative for depression. The patient is not nervous/anxious.        Objective:    Physical Exam Constitutional:  General: She is not in acute distress.    Appearance: She is not ill-appearing.  HENT:     Mouth/Throat:     Mouth: Mucous membranes are moist.     Pharynx: Oropharynx is clear.   Eyes:     Extraocular Movements: Extraocular movements intact.     Conjunctiva/sclera: Conjunctivae normal.    Cardiovascular:     Rate and Rhythm: Normal rate and regular rhythm.  Pulmonary:     Effort: Pulmonary effort is normal.     Breath sounds: Normal breath sounds.   Musculoskeletal:     Cervical back: Normal range of motion and neck supple.     Right lower leg: No edema.     Left lower leg: No edema.   Skin:    General: Skin is warm and dry.   Neurological:     General: No focal deficit present.     Mental Status: She is alert and oriented to person, place, and time.     Cranial Nerves: No cranial nerve deficit.     Motor: No weakness.     Coordination: Coordination normal.     Gait: Gait normal.   Psychiatric:        Mood and Affect: Mood normal.        Behavior: Behavior normal.        Thought Content: Thought content normal.      BP 138/80 Comment: Repeat BP  Pulse 60   Temp 98 F (36.7 C) (Temporal)   Ht 5' 8 (1.727 m)   Wt 143 lb 9.6 oz (65.1 kg)   LMP 11/17/2002   SpO2 96%   BMI 21.83 kg/m  Wt Readings from Last 3 Encounters:  07/07/23 143 lb 9.6 oz (65.1 kg)  05/18/23 148 lb (67.1 kg)  05/03/23 149 lb (67.6 kg)       Assessment & Plan:   Problem List Items Addressed This Visit     Hyperlipidemia, mixed   Start Crestor 5 mg daily.  Recommend low-fat, low-cholesterol diet and getting adequate exercise.  The 10-year ASCVD risk score (Arnett DK, et al., 2019) is: 17.2%   Values used to calculate the score:     Age: 64 years     Clincally relevant sex: Female     Is Non-Hispanic  African American: No     Diabetic: No     Tobacco smoker: No     Systolic Blood Pressure: 138 mmHg     Is BP treated: Yes     HDL Cholesterol: 63.4 mg/dL     Total Cholesterol: 181 mg/dL       Relevant Medications   rosuvastatin (CRESTOR) 5 MG tablet   Other Relevant Orders   Lipid panel (Completed)   Hypertension - Primary   Reports blood pressure is controlled at home.  Continue current medication and low-sodium diet.  She will let me know if her blood pressure do not in goal range.  Check renal function.      Relevant Medications   rosuvastatin (CRESTOR) 5 MG tablet   Other Relevant Orders   CBC with Differential/Platelet (Completed)   Comprehensive metabolic panel with GFR (Completed)   TSH (Completed)   Prediabetes   Continue limiting sugar and carbohydrates and getting adequate exercise.  Check A1c and follow-up      Relevant Orders   CBC with Differential/Platelet (Completed)   Comprehensive metabolic panel with GFR (Completed)   Hemoglobin A1c (Completed)   TSH (Completed)   Vitamin D  deficiency  Check vitamin D  level and adjust supplemental dose as appropriate      Relevant Orders   VITAMIN D  25 Hydroxy (Vit-D Deficiency, Fractures) (Completed)    The 10-year ASCVD risk score (Arnett DK, et al., 2019) is: 17.2%   Values used to calculate the score:     Age: 64 years     Clincally relevant sex: Female     Is Non-Hispanic African American: No     Diabetic: No     Tobacco smoker: No     Systolic Blood Pressure: 138 mmHg     Is BP treated: Yes     HDL Cholesterol: 63.4 mg/dL     Total Cholesterol: 181 mg/dL   I am having Ammon Bales R. Vallie start on rosuvastatin. I am also having her maintain her fish oil-omega-3 fatty acids, (Flaxseed, Linseed, (FLAXSEED OIL PO)), calcium carbonate, acetaminophen , aspirin EC, Vitamin D3, OVER THE COUNTER MEDICATION, Multiple Vitamins-Minerals (CENTRUM SILVER PO), Zinc , Vitamin C, MAGNESIUM ASPARTATE PO, and  amLODipine .  Meds ordered this encounter  Medications   rosuvastatin (CRESTOR) 5 MG tablet    Sig: Take 1 tablet (5 mg total) by mouth daily.    Dispense:  90 tablet    Refill:  1    Supervising Provider:   Bambi Lever A [[id number]]

## 2023-07-07 NOTE — Assessment & Plan Note (Signed)
 Reports blood pressure is controlled at home.  Continue current medication and low-sodium diet.  She will let me know if her blood pressure do not in goal range.  Check renal function.

## 2023-08-08 ENCOUNTER — Encounter: Payer: Medicare HMO | Admitting: Nurse Practitioner

## 2023-08-09 ENCOUNTER — Ambulatory Visit: Payer: Self-pay

## 2023-08-09 NOTE — Telephone Encounter (Signed)
 Patient is calling because she started taking rosuvastatin  (CRESTOR ) 5 MG tablet [[id number]]; however, feels like she may be having side effects. Muscle spasms in the lower back as well as shoulder pain, just wants to know from PCP what medication adjustments needs to be done.

## 2023-08-09 NOTE — Telephone Encounter (Signed)
 FYI Only or Action Required?: FYI only for provider.  Patient was last seen in primary care on 07/07/2023 by Lendia Boby CROME, NP-C.  Called Nurse Triage reporting Spasms.  Symptoms began several weeks ago.  Interventions attempted: OTC medications: Tylenol .  Symptoms are: gradually worsening.  Triage Disposition: See PCP When Office is Open (Within 3 Days)  Patient/caregiver understands and will follow disposition?: No, wishes to speak with PCP- declined scheduling, just wants to know from PCP what medication adjustments needs to be done. This RN advised patient to stop taking the rosuvastatin  for now.    Copied from CRM #[id number]. Topic: Clinical - Red Word Triage >> Aug 09, 2023  1:42 PM [name] [name] wrote: Red Word that prompted transfer to Nurse Triage: Patient is calling because she started taking rosuvastatin  (CRESTOR ) 5 MG tablet [[id number]]; however, feels like she may be having side effects. Muscle spasms in the lower back as well as shoulder pain. Reason for Disposition  [1] MILD or MODERATE muscle aches or pain AND [2] taking a statin medicine (a lipid or cholesterol lowering drug)  Answer Assessment - Initial Assessment Questions 1. ONSET: When did the muscle aches or body pains start?      Back started this morning, and neck pain for about a week.   2. LOCATION: What part of your body is hurting? (e.g., entire body, arms, legs)      Lower back, neck stiif, and shoulder pain  3. SEVERITY: How bad is the pain? (Scale 1-10; or mild, moderate, severe)     7/10 cramping when sitting down and/or walking  4. CAUSE: What do you think is causing the pains?     Thinks it may be related to rosuvastatin   5. FEVER: Do you have a fever? If Yes, ask: What is your temperature, how was it measured, and  when did it start?      No  6. OTHER SYMPTOMS: Do you have any other symptoms? (e.g., chest pain, cold or flu symptoms, rash, weakness, weight loss)     No  7. PREGNANCY:  Is there any chance you are pregnant? When was your last menstrual period?     No  8. TRAVEL: Have you traveled out of the country in the last month? (e.g., exposures, travel history)     no  Protocols used: Muscle Aches and Body Pain-A-AH

## 2023-08-10 NOTE — Telephone Encounter (Signed)
 Fyi called pt to inform of PCP response, pt states she stopped taking it and since then her sx have resolved.

## 2023-10-07 ENCOUNTER — Ambulatory Visit (INDEPENDENT_AMBULATORY_CARE_PROVIDER_SITE_OTHER): Admitting: Family Medicine

## 2023-10-07 ENCOUNTER — Encounter: Payer: Self-pay | Admitting: Family Medicine

## 2023-10-07 VITALS — BP 130/86 | HR 72 | Temp 97.9°F | Ht 68.0 in | Wt 141.4 lb

## 2023-10-07 DIAGNOSIS — E782 Mixed hyperlipidemia: Secondary | ICD-10-CM | POA: Diagnosis not present

## 2023-10-07 DIAGNOSIS — Z9889 Other specified postprocedural states: Secondary | ICD-10-CM | POA: Diagnosis not present

## 2023-10-07 DIAGNOSIS — Z0001 Encounter for general adult medical examination with abnormal findings: Secondary | ICD-10-CM | POA: Diagnosis not present

## 2023-10-07 DIAGNOSIS — R002 Palpitations: Secondary | ICD-10-CM

## 2023-10-07 DIAGNOSIS — Z9189 Other specified personal risk factors, not elsewhere classified: Secondary | ICD-10-CM

## 2023-10-07 DIAGNOSIS — R9431 Abnormal electrocardiogram [ECG] [EKG]: Secondary | ICD-10-CM | POA: Diagnosis not present

## 2023-10-07 DIAGNOSIS — M791 Myalgia, unspecified site: Secondary | ICD-10-CM

## 2023-10-07 DIAGNOSIS — R7303 Prediabetes: Secondary | ICD-10-CM | POA: Diagnosis not present

## 2023-10-07 DIAGNOSIS — I10 Essential (primary) hypertension: Secondary | ICD-10-CM

## 2023-10-07 DIAGNOSIS — T466X5A Adverse effect of antihyperlipidemic and antiarteriosclerotic drugs, initial encounter: Secondary | ICD-10-CM

## 2023-10-07 LAB — LIPID PANEL
Cholesterol: 180 mg/dL (ref 0–200)
HDL: 61.3 mg/dL (ref >39.00)
LDL Cholesterol: 100 mg/dL — ABNORMAL HIGH (ref 0–99)
NonHDL: 119.02
Total CHOL/HDL Ratio: 3
Triglycerides: 95 mg/dL (ref 0.0–149.0)
VLDL: 19 mg/dL (ref 0.0–40.0)

## 2023-10-07 LAB — COMPREHENSIVE METABOLIC PANEL WITH GFR
ALT: 15 U/L (ref 0–35)
AST: 20 U/L (ref 0–37)
Albumin: 4.8 g/dL (ref 3.5–5.2)
Alkaline Phosphatase: 48 U/L (ref 39–117)
BUN: 16 mg/dL (ref 6–23)
CO2: 30 meq/L (ref 19–32)
Calcium: 10.2 mg/dL (ref 8.4–10.5)
Chloride: 102 meq/L (ref 96–112)
Creatinine, Ser: 0.65 mg/dL (ref 0.40–1.20)
GFR: 87.61 mL/min (ref >60.00)
Glucose, Bld: 100 mg/dL — ABNORMAL HIGH (ref 70–99)
Potassium: 4 meq/L (ref 3.5–5.1)
Sodium: 141 meq/L (ref 135–145)
Total Bilirubin: 0.7 mg/dL (ref 0.2–1.2)
Total Protein: 7.7 g/dL (ref 6.0–8.3)

## 2023-10-07 LAB — TSH: TSH: 1.93 u[IU]/mL (ref 0.35–5.50)

## 2023-10-07 LAB — CBC WITH DIFFERENTIAL/PLATELET
Basophils Absolute: 0 K/uL (ref 0.0–0.1)
Basophils Relative: 0.6 % (ref 0.0–3.0)
Eosinophils Absolute: 0 K/uL (ref 0.0–0.7)
Eosinophils Relative: 0.9 % (ref 0.0–5.0)
HCT: 39 % (ref 36.0–46.0)
Hemoglobin: 13 g/dL (ref 12.0–15.0)
Lymphocytes Relative: 25.4 % (ref 12.0–46.0)
Lymphs Abs: 1.4 K/uL (ref 0.7–4.0)
MCHC: 33.4 g/dL (ref 30.0–36.0)
MCV: 91.9 fl (ref 78.0–100.0)
Monocytes Absolute: 0.4 K/uL (ref 0.1–1.0)
Monocytes Relative: 6.8 % (ref 3.0–12.0)
Neutro Abs: 3.6 K/uL (ref 1.4–7.7)
Neutrophils Relative %: 66.3 % (ref 43.0–77.0)
Platelets: 291 K/uL (ref 150.0–400.0)
RBC: 4.25 Mil/uL (ref 3.87–5.11)
RDW: 12.9 % (ref 11.5–15.5)
WBC: 5.4 K/uL (ref 4.0–10.5)

## 2023-10-07 LAB — HEMOGLOBIN A1C: Hgb A1c MFr Bld: 6.2 % (ref 4.6–6.5)

## 2023-10-07 LAB — T4, FREE: Free T4: 0.97 ng/dL (ref 0.60–1.60)

## 2023-10-07 NOTE — Progress Notes (Signed)
 Complete physical exam  Patient: [name] [name]   DOB: [birth year]   73 y.o. Female  MRN: [id number]  Subjective:    Chief Complaint  Patient presents with   Annual Exam    Fasting   She is here for a complete physical exam.   Discussed the use of AI scribe software for clinical note transcription with the patient, who gave verbal consent to proceed.  History of Present Illness [name] [name] is a 73 year old female with hypertension, prediabetes, and hyperlipidemia who presents for an annual physical exam and follow-up on chronic health conditions.  Cardiac symptoms - Occasional fluttering sensations in the chest, relieved by deep breathing - No associated chest pain, shortness of breath, dizziness, or syncope - No history of echocardiograms - History of cardiac surgery for congenital heart defect - Family history of heart surgeries in her daughters  Hypertension - Home blood pressure typically ranges from 120-130/60s - Elevated blood pressure today attributed to rushing - No current symptoms of shortness of breath, cough, or dizziness  Hyperlipidemia - Discontinued cholesterol medication after experiencing significant back pain - Took cholesterol medication for a couple of weeks before stopping  Thyroid  dysfunction - History of thyroiditis, previously treated with medication - Not currently on thyroid  medication  Gastrointestinal symptoms - Bowel movements are regular - Experiences nervous stomach when visiting medical providers  Physical activity - Retired Conservator, museum/gallery for [employer] - Stays active by taking her dog out and engaging in household activities - Has reduced walking recently    Health Maintenance  Topic Date Due   Zoster (Shingles) Vaccine (1 of 2) 10/08/1969   Medicare Annual Wellness Visit  10/27/2023   COVID-19 Vaccine (1) 10/23/2023*   DTaP/Tdap/Td vaccine (3 - Td or Tdap) 10/27/2023*   Flu Shot  04/17/2024*   Breast Cancer Screening   05/23/2024   Colon Cancer Screening  06/24/2026   Pneumococcal Vaccine for age over 6  Completed   DEXA scan (bone density measurement)  Completed   Hepatitis C Screening  Completed   HPV Vaccine  Aged Out   Meningitis B Vaccine  Aged Out  *Topic was postponed. The date shown is not the original due date.    Wears seatbelt always, uses sunscreen, smoke detectors in home and functioning, does not text while driving, feels safe in home environment.  Depression screening:    10/07/2023   10:38 AM 05/18/2023    2:32 PM 05/03/2023   10:37 AM  Depression screen PHQ 2/9  Decreased Interest 0 0 0  Down, Depressed, Hopeless 0 0 0  PHQ - 2 Score 0 0 0  Altered sleeping 1 0 0  Tired, decreased energy 0 0 0  Change in appetite 0 0 0  Feeling bad or failure about yourself  0 0 0  Trouble concentrating 0    Moving slowly or fidgety/restless 0    Suicidal thoughts 0    PHQ-9 Score 1 0 0  Difficult doing work/chores Not difficult at all     Anxiety Screening:     No data to display          Vision:Within last year and Dental: No current dental problems and Receives regular dental care  Patient Active Problem List   Diagnosis Date Noted   Risk of myocardial infarction or stroke 7.5% or greater in next 10 years 10/07/2023   Palpitations 10/07/2023   Prediabetes 07/07/2023   Tick bite of right thigh 05/18/2023   Skin infection 05/18/2023  Abnormal glucose 08/20/2018   History of thyroiditis 03/26/2018   Osteopenia of left femoral neck 03/23/2018   Cystocele with prolapse 03/23/2018   BMI 22.0-22.9, adult 06/01/2017   Medication management 07/26/2014   Hyperlipidemia, mixed    Seasonal allergies    Vitamin D  deficiency    Hypertension    History of colonic polyps 03/23/2011   Past Medical History:  Diagnosis Date   Abnormal Pap smear of cervix    Acute lower GI bleeding 02/16/2016   Allergy    Amoxicillin   Blood transfusion without reported diagnosis 2018   Heart  murmur    Hyperlipidemia    Hypertension    Prediabetes    Seasonal allergies    Thyroiditis 07/22/2017   Vitamin D  deficiency    Past Surgical History:  Procedure Laterality Date   CARDIAC SURGERY  1958   at age 79 for hole in heart   CERVIX LESION DESTRUCTION N/A ? years ago   with abnormal pap   COLONOSCOPY     polyps   COLONOSCOPY N/A 02/16/2016   Procedure: COLONOSCOPY;  Surgeon: Gwendlyn ONEIDA Buddy, MD;  Location: [facility];  Service: Endoscopy;  Laterality: N/A;   CRYOTHERAPY     for abnormal pap smear   JOINT REPLACEMENT  01/29/2022   right hip replaced   TUBAL LIGATION     Social History   Tobacco Use   Smoking status: Never   Smokeless tobacco: Never  Vaping Use   Vaping status: Never Used  Substance Use Topics   Alcohol use: Yes    Alcohol/week: 7.0 standard drinks of alcohol    Types: 7 Glasses of wine per week   Drug use: No      Patient Care Team: Lendia Boby CROME, NP-C as PCP - General (Family Medicine)   Outpatient Medications Prior to Visit  Medication Sig   acetaminophen  (TYLENOL ) 325 MG tablet Take 2 tablets (650 mg total) by mouth every 6 (six) hours as needed for mild pain (or Fever >/= 101).   amLODipine  (NORVASC ) 2.5 MG tablet Take 1 tablet daily for BP   Ascorbic Acid (VITAMIN C) 500 MG CAPS Take by mouth.   aspirin EC 81 MG tablet Take 81 mg by mouth every other day.    calcium  carbonate (OS-CAL) 600 MG TABS tablet Take 600 mg by mouth daily with breakfast.   Cholecalciferol (VITAMIN D3) 125 MCG (5000 UT) CAPS Take 5,000 Units by mouth daily.   fish oil-omega-3 fatty acids 1000 MG capsule Take 1 g by mouth 3 (three) times daily with meals.    Flaxseed, Linseed, (FLAXSEED OIL PO) Take 1 capsule by mouth 3 (three) times daily with meals.    MAGNESIUM ASPARTATE PO Take by mouth.   Multiple Vitamins-Minerals (CENTRUM SILVER PO) Take by mouth.   OVER THE COUNTER MEDICATION Takes Allergy meds as needed   Zinc  50 MG TABS Take 1/2 tablet  (25 mg)   Daily   rosuvastatin  (CRESTOR ) 5 MG tablet Take 1 tablet (5 mg total) by mouth daily. (Patient not taking: Reported on 10/07/2023)   No facility-administered medications prior to visit.    Review of Systems  Constitutional:  Negative for chills, fever and malaise/fatigue.  HENT:  Negative for congestion, ear pain, sinus pain and sore throat.   Eyes:  Negative for blurred vision, double vision and pain.  Respiratory:  Negative for cough, shortness of breath and wheezing.   Cardiovascular:  Positive for palpitations. Negative for chest pain, orthopnea and leg  swelling.  Gastrointestinal:  Negative for abdominal pain, constipation, diarrhea, nausea and vomiting.  Genitourinary:  Negative for dysuria, frequency and urgency.  Musculoskeletal:  Negative for back pain, joint pain and myalgias.  Skin:  Negative for rash.  Neurological:  Negative for dizziness, tingling, focal weakness and headaches.  Psychiatric/Behavioral:  Negative for depression and memory loss. The patient is not nervous/anxious.        Objective:    BP 130/86   Pulse 72   Temp 97.9 F (36.6 C)   Ht 5' 8 (1.727 m)   Wt 141 lb 6.4 oz (64.1 kg)   LMP 11/17/2002   SpO2 97%   BMI 21.50 kg/m  BP Readings from Last 3 Encounters:  10/07/23 130/86  07/07/23 138/80  05/18/23 (!) 154/78   Wt Readings from Last 3 Encounters:  10/07/23 141 lb 6.4 oz (64.1 kg)  07/07/23 143 lb 9.6 oz (65.1 kg)  05/18/23 148 lb (67.1 kg)    Physical Exam Constitutional:      General: She is not in acute distress.    Appearance: She is not ill-appearing.  HENT:     Right Ear: Tympanic membrane, ear canal and external ear normal.     Left Ear: Tympanic membrane, ear canal and external ear normal.     Nose: Nose normal.     Mouth/Throat:     Mouth: Mucous membranes are moist.     Pharynx: Oropharynx is clear.  Eyes:     Extraocular Movements: Extraocular movements intact.     Conjunctiva/sclera: Conjunctivae normal.     Pupils:  Pupils are equal, round, and reactive to light.  Neck:     Thyroid : No thyroid  mass, thyromegaly or thyroid  tenderness.  Cardiovascular:     Rate and Rhythm: Normal rate and regular rhythm.     Pulses: Normal pulses.     Heart sounds: Normal heart sounds.  Pulmonary:     Effort: Pulmonary effort is normal.     Breath sounds: Normal breath sounds.  Abdominal:     General: Bowel sounds are normal.     Palpations: Abdomen is soft.     Tenderness: There is no abdominal tenderness. There is no right CVA tenderness, left CVA tenderness, guarding or rebound.  Musculoskeletal:        General: Normal range of motion.     Cervical back: Normal range of motion and neck supple. No tenderness.     Right lower leg: No edema.     Left lower leg: No edema.  Lymphadenopathy:     Cervical: No cervical adenopathy.  Skin:    General: Skin is warm and dry.     Findings: No lesion or rash.  Neurological:     General: No focal deficit present.     Mental Status: She is alert and oriented to person, place, and time.     Cranial Nerves: No cranial nerve deficit.     Sensory: No sensory deficit.     Motor: No weakness.     Gait: Gait normal.  Psychiatric:        Mood and Affect: Mood normal.        Behavior: Behavior normal.        Thought Content: Thought content normal.      Results for orders placed or performed in visit on 10/07/23  T4, free  Result Value Ref Range   Free T4 0.97 0.60 - 1.60 ng/dL  TSH  Result Value Ref Range   TSH  1.93 0.35 - 5.50 uIU/mL  Lipid panel  Result Value Ref Range   Cholesterol 180 0 - 200 mg/dL   Triglycerides 04.9 0.0 - 149.0 mg/dL   HDL 38.69 >60.99 mg/dL   VLDL 80.9 0.0 - 59.9 mg/dL   LDL Cholesterol 899 (H) 0 - 99 mg/dL   Total CHOL/HDL Ratio 3    NonHDL 119.02   Hemoglobin A1c  Result Value Ref Range   Hgb A1c MFr Bld 6.2 4.6 - 6.5 %  Comprehensive metabolic panel with GFR  Result Value Ref Range   Sodium 141 135 - 145 mEq/L   Potassium 4.0 3.5  - 5.1 mEq/L   Chloride 102 96 - 112 mEq/L   CO2 30 19 - 32 mEq/L   Glucose, Bld 100 (H) 70 - 99 mg/dL   BUN 16 6 - 23 mg/dL   Creatinine, Ser 9.34 0.40 - 1.20 mg/dL   Total Bilirubin 0.7 0.2 - 1.2 mg/dL   Alkaline Phosphatase 48 39 - 117 U/L   AST 20 0 - 37 U/L   ALT 15 0 - 35 U/L   Total Protein 7.7 6.0 - 8.3 g/dL   Albumin 4.8 3.5 - 5.2 g/dL   GFR 12.38 >39.99 mL/min   Calcium  10.2 8.4 - 10.5 mg/dL  CBC with Differential/Platelet  Result Value Ref Range   WBC 5.4 4.0 - 10.5 K/uL   RBC 4.25 3.87 - 5.11 Mil/uL   Hemoglobin 13.0 12.0 - 15.0 g/dL   HCT 60.9 63.9 - 53.9 %   MCV 91.9 78.0 - 100.0 fl   MCHC 33.4 30.0 - 36.0 g/dL   RDW 87.0 88.4 - 84.4 %   Platelets 291.0 150.0 - 400.0 K/uL   Neutrophils Relative % 66.3 43.0 - 77.0 %   Lymphocytes Relative 25.4 12.0 - 46.0 %   Monocytes Relative 6.8 3.0 - 12.0 %   Eosinophils Relative 0.9 0.0 - 5.0 %   Basophils Relative 0.6 0.0 - 3.0 %   Neutro Abs 3.6 1.4 - 7.7 K/uL   Lymphs Abs 1.4 0.7 - 4.0 K/uL   Monocytes Absolute 0.4 0.1 - 1.0 K/uL   Eosinophils Absolute 0.0 0.0 - 0.7 K/uL   Basophils Absolute 0.0 0.0 - 0.1 K/uL      Assessment & Plan:    Routine Health Maintenance and Physical Exam Problem List Items Addressed This Visit     Hyperlipidemia, mixed   Relevant Orders   Lipid panel (Completed)   Hypertension   Relevant Orders   CBC with Differential/Platelet (Completed)   Comprehensive metabolic panel with GFR (Completed)   TSH (Completed)   T4, free (Completed)   Palpitations   Relevant Orders   CBC with Differential/Platelet (Completed)   Comprehensive metabolic panel with GFR (Completed)   TSH (Completed)   T4, free (Completed)   EKG 12-Lead (Completed)   Ambulatory referral to Cardiology   Prediabetes   Relevant Orders   Hemoglobin A1c (Completed)   TSH (Completed)   T4, free (Completed)   Risk of myocardial infarction or stroke 7.5% or greater in next 10 years   Relevant Orders   Lipid panel  (Completed)   Ambulatory referral to Cardiology   Other Visit Diagnoses       Encounter for general adult medical examination with abnormal findings    -  Primary     Myalgia due to statin         History of heart surgery         Abnormal  EKG       Relevant Orders   Ambulatory referral to Cardiology       Assessment and Plan Assessment & Plan Adult Wellness Visit Annual physical exam and preventive health care visit. Up to date on colonoscopy and mammogram. Pelvic exam managed by OB GYN. Medicare Wellness due soon. Declined second shingles vaccine. Regular dental and eye exams. Staying active, though not walking as much recently. - Complete Medicare Wellness over the phone with nurse  Palpitations Intermittent fluttering sensation in the chest, occurring a few days a week, relieved by deep breaths. No associated chest pain, shortness of breath, dizziness, or syncope. Heart murmur and childhood cardiac surgery for a congenital defect. No prior cardiology evaluation. Differential includes irregular heartbeats. High ASCVD  The 10-year ASCVD risk score (Arnett DK, et al., 2019) is: 17.3%   Values used to calculate the score:     Age: 1 years     Clincally relevant sex: Female     Is Non-Hispanic African American: No     Diabetic: No     Tobacco smoker: No     Systolic Blood Pressure: 130 mmHg     Is BP treated: Yes     HDL Cholesterol: 61.3 mg/dL     Total Cholesterol: 180 mg/dL  - Order blood work to evaluate palpitations - Perform EKG - Refer to cardiologist for further evaluation.   Essential hypertension Blood pressure elevated today, possibly due to rushing to the appointment. Home readings typically in the 120s-130s/60s, indicating well-controlled hypertension. - Recheck blood pressure at the end of the visit  Mixed hyperlipidemia Previously prescribed cholesterol medication caused back pain, suspected to be a side effect. She stopped medication after two weeks. High  calculated risk of cardiovascular events over the next ten years. - Discuss alternative cholesterol medications  Prediabetes Prediabetes discussed in the context of overall cardiovascular risk and preventive care.  Myalgia from statin Back pain developed after starting cholesterol medication, suspected to be statin-induced myalgia. Pain resolved after discontinuation of the medication. - Discuss alternative cholesterol medications   EKG shows NSR, rate 63, slight T wave depression (flat on EKG in 2024), Q wave lead III unchanged. No ST elevation   Return in about 6 months (around 04/05/2024) for chronic health conditions.     Boby Mackintosh, NP-C

## 2023-10-10 ENCOUNTER — Ambulatory Visit: Payer: Self-pay | Admitting: Family Medicine

## 2023-10-18 DIAGNOSIS — H25813 Combined forms of age-related cataract, bilateral: Secondary | ICD-10-CM | POA: Diagnosis not present

## 2023-10-18 DIAGNOSIS — H40023 Open angle with borderline findings, high risk, bilateral: Secondary | ICD-10-CM | POA: Diagnosis not present

## 2023-10-27 ENCOUNTER — Ambulatory Visit: Payer: Medicare HMO | Admitting: Nurse Practitioner

## 2023-10-28 ENCOUNTER — Ambulatory Visit

## 2023-10-28 VITALS — Ht 68.0 in | Wt 144.0 lb

## 2023-10-28 DIAGNOSIS — Z0000 Encounter for general adult medical examination without abnormal findings: Secondary | ICD-10-CM | POA: Diagnosis not present

## 2023-10-28 NOTE — Progress Notes (Signed)
 Subjective:   [name] [name] is a 73 y.o. who presents for a Medicare Wellness preventive visit.  As a reminder, Annual Wellness Visits don't include a physical exam, and some assessments may be limited, especially if this visit is performed virtually. We may recommend an in-person follow-up visit with your provider if needed.  Visit Complete: Virtual I connected with  [name] [name] on 10/28/23 by a audio enabled telemedicine application and verified that I am speaking with the correct person using two identifiers.  Patient Location: Home  Provider Location: Office/Clinic  I discussed the limitations of evaluation and management by telemedicine. The patient expressed understanding and agreed to proceed.  Vital Signs: Because this visit was a virtual/telehealth visit, some criteria may be missing or patient reported. Any vitals not documented were not able to be obtained and vitals that have been documented are patient reported.  VideoDeclined- This patient declined Librarian, academic. Therefore the visit was completed with audio only.  Persons Participating in Visit: Patient.  AWV Questionnaire: No: Patient Medicare AWV questionnaire was not completed prior to this visit.  Cardiac Risk Factors include: advanced age (>40men, >50 women);dyslipidemia;hypertension     Objective:    Today's Vitals   10/28/23 1514  Weight: 144 lb (65.3 kg)  Height: 5' 8 (1.727 m)   Body mass index is 21.9 kg/m.     10/28/2023    3:14 PM 10/27/2022   11:15 AM 07/14/2021    9:39 AM 07/14/2020    9:30 AM 07/13/2019   11:15 AM 05/16/2018   10:28 AM 05/09/2017   10:53 AM  Advanced Directives  Does Patient Have a Medical Advance Directive? Yes Yes Yes Yes Yes Yes Yes   Type of Estate agent of [place];Living will Healthcare Power of [place];Living will Living will Healthcare Power of [place];Living will Healthcare Power of [place];Living will  Healthcare Power of [place];Living will Healthcare Power of [place];Living will  Does patient want to make changes to medical advance directive?  No - Patient declined No - Patient declined No - Patient declined     Copy of Healthcare Power of Attorney in Chart? No - copy requested No - copy requested  No - copy requested No - copy requested No - copy requested  No - copy requested      Data saved with a previous flowsheet row definition    Current Medications (verified) Outpatient Encounter Medications as of 10/28/2023  Medication Sig   acetaminophen  (TYLENOL ) 325 MG tablet Take 2 tablets (650 mg total) by mouth every 6 (six) hours as needed for mild pain (or Fever >/= 101).   amLODipine  (NORVASC ) 2.5 MG tablet Take 1 tablet daily for BP   Ascorbic Acid (VITAMIN C) 500 MG CAPS Take by mouth.   aspirin EC 81 MG tablet Take 81 mg by mouth every other day.    calcium  carbonate (OS-CAL) 600 MG TABS tablet Take 600 mg by mouth daily with breakfast.   Cholecalciferol (VITAMIN D3) 125 MCG (5000 UT) CAPS Take 5,000 Units by mouth daily.   fish oil-omega-3 fatty acids 1000 MG capsule Take 1 g by mouth 3 (three) times daily with meals.    Flaxseed, Linseed, (FLAXSEED OIL PO) Take 1 capsule by mouth 3 (three) times daily with meals.    MAGNESIUM ASPARTATE PO Take by mouth.   Multiple Vitamins-Minerals (CENTRUM SILVER PO) Take by mouth.   OVER THE COUNTER MEDICATION Takes Allergy meds as needed   Zinc  50 MG  TABS Take 1/2 tablet  (25 mg)  Daily   rosuvastatin  (CRESTOR ) 5 MG tablet Take 1 tablet (5 mg total) by mouth daily. (Patient not taking: Reported on 10/28/2023)   No facility-administered encounter medications on file as of 10/28/2023.    Allergies (verified) Amoxil [amoxicillin] and Ampicillin   History: Past Medical History:  Diagnosis Date   Abnormal Pap smear of cervix    Acute lower GI bleeding 02/16/2016   Allergy    Amoxicillin   Blood transfusion without reported diagnosis  2018   Heart murmur    Hyperlipidemia    Hypertension    Prediabetes    Seasonal allergies    Thyroiditis 07/22/2017   Vitamin D  deficiency    Past Surgical History:  Procedure Laterality Date   CARDIAC SURGERY  1958   at age 28 for hole in heart   CERVIX LESION DESTRUCTION N/A ? years ago   with abnormal pap   COLONOSCOPY     polyps   COLONOSCOPY N/A 02/16/2016   Procedure: COLONOSCOPY;  Surgeon: Gwendlyn ONEIDA Buddy, MD;  Location: [facility];  Service: Endoscopy;  Laterality: N/A;   CRYOTHERAPY     for abnormal pap smear   JOINT REPLACEMENT  01/29/2022   right hip replaced   TUBAL LIGATION     Family History  Problem Relation Age of Onset   Diabetes Father    Colon cancer Sister 22   Diabetes Sister    Miscarriages / Stillbirths Sister    Diabetes Sister    Diabetes Brother    Stomach cancer Neg Hx    Breast cancer Neg Hx    Esophageal cancer Neg Hx    Rectal cancer Neg Hx    BRCA 1/2 Neg Hx    Social History   Socioeconomic History   Marital status: Married    Spouse name: Not on file   Number of children: Not on file   Years of education: Not on file   Highest education level: 12th grade  Occupational History   Not on file  Tobacco Use   Smoking status: Never   Smokeless tobacco: Never  Vaping Use   Vaping status: Never Used  Substance and Sexual Activity   Alcohol use: Yes    Alcohol/week: 7.0 standard drinks of alcohol    Types: 7 Glasses of wine per week   Drug use: No   Sexual activity: Not Currently    Partners: Male    Birth control/protection: Surgical    Comment: BTL  Other Topics Concern   Not on file  Social History Narrative   Not on file   Social Drivers of Health   Financial Resource Strain: Low Risk  (10/28/2023)   Overall Financial Resource Strain (CARDIA)    Difficulty of Paying Living Expenses: Not hard at all  Food Insecurity: No Food Insecurity (10/28/2023)   Hunger Vital Sign    Worried About Running Out of Food in the  Last Year: Never true    Ran Out of Food in the Last Year: Never true  Transportation Needs: No Transportation Needs (10/28/2023)   PRAPARE - Administrator, Civil Service (Medical): No    Lack of Transportation (Non-Medical): No  Physical Activity: Insufficiently Active (10/28/2023)   Exercise Vital Sign    Days of Exercise per Week: 3 days    Minutes of Exercise per Session: 30 min  Stress: No Stress Concern Present (10/28/2023)   [employer] of Occupational Health - Occupational Stress Questionnaire  Feeling of Stress: Not at all  Social Connections: Moderately Isolated (10/28/2023)   Social Connection and Isolation Panel    Frequency of Communication with Friends and Family: More than three times a week    Frequency of Social Gatherings with Friends and Family: Once a week    Attends Religious Services: Never    Database administrator or Organizations: No    Attends Engineer, structural: Never    Marital Status: Married    Tobacco Counseling Counseling given: Not Answered    Clinical Intake:  Pre-visit preparation completed: Yes  Pain : No/denies pain     BMI - recorded: 21.9 Nutritional Status: BMI of 19-24  Normal Nutritional Risks: None Diabetes: No  Lab Results  Component Value Date   HGBA1C 6.2 10/07/2023   HGBA1C 5.9 07/07/2023   HGBA1C 5.9 (H) 02/01/2023     How often do you need to have someone help you when you read instructions, pamphlets, or other written materials from your doctor or pharmacy?: 1 - Never  Interpreter Needed?: No  Information entered by :: Verdie Saba, CMA   Activities of Daily Living     10/28/2023    3:17 PM 01/31/2023   10:26 PM  In your present state of health, do you have any difficulty performing the following activities:  Hearing? 0 0  Vision? 0 0  Difficulty concentrating or making decisions? 0 0  Walking or climbing stairs? 0 0  Dressing or bathing? 0 0  Doing errands, shopping? 0 0   Preparing Food and eating ? N   Using the Toilet? N   In the past six months, have you accidently leaked urine? Y   Comment wears a pantyliner   Do you have problems with loss of bowel control? N   Managing your Medications? N   Managing your Finances? N   Housekeeping or managing your Housekeeping? N     Patient Care Team: Lendia Boby CROME, NP-C as PCP - General (Family Medicine) Robinson Idol, MD as Consulting Physician (Ophthalmology)  I have updated your Care Teams any recent Medical Services you may have received from other providers in the past year.     Assessment:   This is a routine wellness examination for [name] [name].  Hearing/Vision screen Hearing Screening - Comments:: Denies hearing difficulties   Vision Screening - Comments:: Denies vision concerns - sees [employer]   Goals Addressed               This Visit's Progress     Patient Stated (pt-stated)        Patient stated she plans to continue exercising       Depression Screen     10/28/2023    3:18 PM 10/07/2023   10:38 AM 05/18/2023    2:32 PM 05/03/2023   10:37 AM 01/31/2023   10:26 PM 10/27/2022   11:16 AM 07/14/2021    9:47 AM  PHQ 2/9 Scores  PHQ - 2 Score 0 0 0 0 0 0 0  PHQ- 9 Score 0 1 0 0       Fall Risk     10/28/2023    3:17 PM 10/07/2023   10:37 AM 05/18/2023    2:32 PM 05/03/2023   10:34 AM 01/31/2023   10:26 PM  Fall Risk   Falls in the past year? 0 0 0 0 0  Number falls in past yr: 0  0 0   Injury with Fall? 0  0  0   Risk for fall due to : No Fall Risks No Fall Risks No Fall Risks No Fall Risks No Fall Risks  Follow up Falls evaluation completed;Falls prevention discussed  Falls evaluation completed Falls evaluation completed Falls prevention discussed;Education provided;Falls evaluation completed    MEDICARE RISK AT HOME:  Medicare Risk at Home Any stairs in or around the home?: No If so, are there any without handrails?: No Home free of loose throw rugs in walkways, pet  beds, electrical cords, etc?: Yes Adequate lighting in your home to reduce risk of falls?: Yes Life alert?: No Use of a cane, walker or w/c?: No Grab bars in the bathroom?: No Shower chair or bench in shower?: No Elevated toilet seat or a handicapped toilet?: Yes  TIMED UP AND GO:  Was the test performed?  No  Cognitive Function: 6CIT completed        10/28/2023    3:20 PM  6CIT Screen  What Year? 0 points  What month? 0 points  What time? 0 points  Count back from 20 0 points  Months in reverse 0 points  Repeat phrase 0 points  Total Score 0 points    Immunizations Immunization History  Administered Date(s) Administered   INFLUENZA, HIGH DOSE SEASONAL PF 10/23/2015, 11/08/2016, 11/25/2017, 10/04/2018, 11/06/2020, 10/29/2021, 10/08/2022   Influenza Inj Mdck Quad With Preservative 01/03/2020   Influenza Split 01/03/2013   PPD Test 07/13/2013   Pneumococcal Conjugate-13 01/08/2016   Pneumococcal Polysaccharide-23 12/19/2017   Pneumococcal-Unspecified 01/18/2002   Tdap 01/19/2003, 07/05/2012   Zoster, Live 07/05/2012    Screening Tests Health Maintenance  Topic Date Due   COVID-19 Vaccine (1) Never done   Zoster Vaccines- Shingrix (1 of 2) 10/08/1969   DTaP/Tdap/Td (3 - Td or Tdap) 07/06/2022   Influenza Vaccine  04/17/2024 (Originally 08/19/2023)   Mammogram  05/23/2024   Medicare Annual Wellness (AWV)  10/27/2024   Colonoscopy  06/24/2026   Pneumococcal Vaccine: 50+ Years  Completed   DEXA SCAN  Completed   Hepatitis C Screening  Completed   Meningococcal B Vaccine  Aged Out    Health Maintenance Items Addressed: 10/28/2023  Additional Screening:  Vision Screening: Recommended annual ophthalmology exams for early detection of glaucoma and other disorders of the eye. Is the patient up to date with their annual eye exam?  Yes  Who is the provider or what is the name of the office in which the patient attends annual eye exams? [facility]  Dental  Screening: Recommended annual dental exams for proper oral hygiene  Community Resource Referral / Chronic Care Management: CRR required this visit?  No   CCM required this visit?  No   Plan:    I have personally reviewed and noted the following in the patient's chart:   Medical and social history Use of alcohol, tobacco or illicit drugs  Current medications and supplements including opioid prescriptions. Patient is not currently taking opioid prescriptions. Functional ability and status Nutritional status Physical activity Advanced directives List of other physicians Hospitalizations, surgeries, and ER visits in previous 12 months Vitals Screenings to include cognitive, depression, and falls Referrals and appointments  In addition, I have reviewed and discussed with patient certain preventive protocols, quality metrics, and best practice recommendations. A written personalized care plan for preventive services as well as general preventive health recommendations were provided to patient.   Verdie CHRISTELLA Saba, CMA   10/28/2023   After Visit Summary: (MyChart) Due to this being a telephonic visit, the  after visit summary with patients personalized plan was offered to patient via MyChart   Notes: Nothing significant to report at this time.

## 2023-10-28 NOTE — Patient Instructions (Addendum)
 [name] [name],  Thank you for taking the time for your Medicare Wellness Visit. I appreciate your continued commitment to your health goals. Please review the care plan we discussed, and feel free to reach out if I can assist you further.  Medicare recommends these wellness visits once per year to help you and your care team stay ahead of potential health issues. These visits are designed to focus on prevention, allowing your provider to concentrate on managing your acute and chronic conditions during your regular appointments.  Please note that Annual Wellness Visits do not include a physical exam. Some assessments may be limited, especially if the visit was conducted virtually. If needed, we may recommend a separate in-person follow-up with your provider.  Ongoing Care Seeing your primary care provider every 3 to 6 months helps us  monitor your health and provide consistent, personalized care.  Referrals If a referral was made during today's visit and you haven't received any updates within two weeks, please contact the referred provider directly to check on the status.  Recommended Screenings:  Health Maintenance  Topic Date Due   COVID-19 Vaccine (1) Never done   Zoster (Shingles) Vaccine (1 of 2) 10/08/1969   DTaP/Tdap/Td vaccine (3 - Td or Tdap) 07/06/2022   Flu Shot  04/17/2024*   Breast Cancer Screening  05/23/2024   Medicare Annual Wellness Visit  10/27/2024   Colon Cancer Screening  06/24/2026   Pneumococcal Vaccine for age over 29  Completed   DEXA scan (bone density measurement)  Completed   Hepatitis C Screening  Completed   Meningitis B Vaccine  Aged Out  *Topic was postponed. The date shown is not the original due date.       10/28/2023    3:14 PM  Advanced Directives  Does Patient Have a Medical Advance Directive? Yes  Type of Estate agent of [place];Living will  Copy of Healthcare Power of Attorney in Chart? No - copy requested   Advance  Care Planning is important because it: Ensures you receive medical care that aligns with your values, goals, and preferences. Provides guidance to your family and loved ones, reducing the emotional burden of decision-making during critical moments.  Vision: Annual vision screenings are recommended for early detection of glaucoma, cataracts, and diabetic retinopathy. These exams can also reveal signs of chronic conditions such as diabetes and high blood pressure.  Dental: Annual dental screenings help detect early signs of oral cancer, gum disease, and other conditions linked to overall health, including heart disease and diabetes.

## 2023-11-14 ENCOUNTER — Ambulatory Visit: Payer: Medicare HMO | Admitting: Nurse Practitioner

## 2023-12-07 ENCOUNTER — Ambulatory Visit: Attending: Internal Medicine | Admitting: Internal Medicine

## 2023-12-07 VITALS — BP 160/70 | HR 63 | Ht 68.0 in | Wt 144.0 lb

## 2023-12-07 DIAGNOSIS — Q249 Congenital malformation of heart, unspecified: Secondary | ICD-10-CM | POA: Insufficient documentation

## 2023-12-07 DIAGNOSIS — E782 Mixed hyperlipidemia: Secondary | ICD-10-CM

## 2023-12-07 DIAGNOSIS — R011 Cardiac murmur, unspecified: Secondary | ICD-10-CM | POA: Diagnosis not present

## 2023-12-07 DIAGNOSIS — M791 Myalgia, unspecified site: Secondary | ICD-10-CM | POA: Diagnosis not present

## 2023-12-07 DIAGNOSIS — Z789 Other specified health status: Secondary | ICD-10-CM | POA: Diagnosis not present

## 2023-12-07 DIAGNOSIS — T466X5D Adverse effect of antihyperlipidemic and antiarteriosclerotic drugs, subsequent encounter: Secondary | ICD-10-CM

## 2023-12-07 DIAGNOSIS — T466X5A Adverse effect of antihyperlipidemic and antiarteriosclerotic drugs, initial encounter: Secondary | ICD-10-CM | POA: Insufficient documentation

## 2023-12-07 NOTE — Progress Notes (Signed)
 Cardiology Office Note:  .    Date:  12/07/2023  ID:  [name] [name], DOB 06-10-1950, MRN [id number] PCP: Lendia Boby CROME, NP-C  [facility] Providers Cardiologist:  None     CC: Palpitations Consulted for the evaluation of congenital heart disease at the behest of Ms. Henson    History of Present Illness: .    [name] [name] is a 73 y.o. female with hypertension and palpitations who presents for evaluation of elevated blood pressure and heart palpitations.  She experiences episodes of heart palpitations, described as a sensation of her heart racing while sitting and watching TV. These episodes require her to take a deep breath to alleviate the sensation. It has been several months since she last experienced these palpitations, with no recent episodes in the past few months.  She has a history of hypertension and notes that her blood pressure is usually not as elevated as it was during this visit.  Regarding her hyperlipidemia, she was previously prescribed rosuvastatin  but discontinued it due to severe back pain, which she identified as a side effect after researching online. She is unsure why she is taking aspirin every other day, as it was recommended by her previous, now deceased, medical doctor.  Her past medical history includes congenital heart disease, for which she underwent open heart surgery at the age of [age]. She recalls being told it was a 'hole in one of the arteries.' Both of her daughters also had patent ductus arteriosus (PDA) surgeries.  In terms of social history, husband works part-time at a liquor store and has specific work commitments on the first and third Wednesdays of each month.  Would prefer testing not being done on Wednesdays.  Discussed the use of AI scribe software for clinical note transcription with the patient, who gave verbal consent to proceed.   Relevant histories: .  Social  - two daughters, husband came to index visit. ROS: As per  HPI.   Physical Exam:    VS:  BP (!) 160/70 (BP Location: Left Arm, Cuff Size: Normal)   Pulse 63   Ht 5' 8 (1.727 m)   Wt 144 lb (65.3 kg)   LMP 11/17/2002   SpO2 97%   BMI 21.90 kg/m    Wt Readings from Last 3 Encounters:  12/07/23 144 lb (65.3 kg)  10/28/23 144 lb (65.3 kg)  10/07/23 141 lb 6.4 oz (64.1 kg)    Gen: no distress Cardiac: No Rubs or Gallops, S3 RRR +2 radial pulses Respiratory: Clear to auscultation bilaterally, normal effort, normal  respiratory rate GI: Soft, nontender, non-distended  MS: No  edema;  moves all extremities Integument: Skin feels warm Neuro:  At time of evaluation, alert and oriented to person/place/time/situation  Psych: Normal affect, patient feels ok  HYPERTENSION CONTROL Vitals:   12/07/23 1138 12/07/23 1202  BP: (!) 167/60 (!) 160/70    The patient's blood pressure is elevated above target today.  In order to address the patient's elevated BP: Blood pressure will be monitored at home to determine if medication changes need to be made. (AMB BM Checks stated, if elevated at time of echo and at home; increase CCB)       ASSESSMENT AND PLAN: .    Congenital heart disease, unspecified Congenital heart disease from 1958, unspecified. Possible PDA closure due to family history. No current symptoms of palpitations for months. Increased risk of arrhythmias due to congenital heart disease. - Ordered echocardiogram to assess heart structure and  function - Will consider coronary artery calcium  score to evaluate for plaque buildup and potentially identify congenital heart defect (need to review these images with this in mind) - no clear ASA indication at this time   Essential hypertension Blood pressure elevated at 160/70 mmHg. Possible white coat hypertension due to new provider visit. No current symptoms of palpitations. - Perform ambulatory blood pressure monitoring at home - Will adjust antihypertensive regimen based on home blood  pressure readings and echocardiogram results (increase norvasc )  Hyperlipidemia with statin-induced myalgia Hyperlipidemia with intolerance to rosuvastatin  due to myalgia. Concerns about statin use and potential cardiovascular risk. Coronary artery calcium  score discussed to assess plaque buildup and guide further management. - Ordered coronary artery calcium  score to assess for plaque buildup - Will discuss alternative lipid-lowering strategies based on coronary artery calcium  score results (rosuvastatin  allergy)  One year unless issues on testing If palpitations return- heart monitor  Stanly Leavens, MD FASE [facility] Cardiologist [facility]  [address] [place], [address] [place], [place] [address] [phone]  12:35 PM

## 2023-12-07 NOTE — Patient Instructions (Addendum)
 Medication Instructions:   Continue all current medications.   Labwork:  none  Testing/Procedures:  Your physician has requested that you have an echocardiogram. Echocardiography is a painless test that uses sound waves to create images of your heart. It provides your doctor with information about the size and shape of your heart and how well your heart's chambers and valves are working. This procedure takes approximately one hour. There are no restrictions for this procedure. Please do NOT wear cologne, perfume, aftershave, or lotions (deodorant is allowed). Please arrive 15 minutes prior to your appointment time.  Please note: We ask at that you not bring children with you during ultrasound (echo/ vascular) testing. Due to room size and safety concerns, children are not allowed in the ultrasound rooms during exams. Our front office staff cannot provide observation of children in our lobby area while testing is being conducted. An adult accompanying a patient to their appointment will only be allowed in the ultrasound room at the discretion of the ultrasound technician under special circumstances. We apologize for any inconvenience.  Coronary calcium  scoring  Office will contact with results via phone, letter or mychart.    Follow-Up:  Your physician wants you to follow up in:  1 year.  You should receive a recall letter in the mail about 2 months prior to the time you are due.  If you don't receive this, please call our office to schedule your follow up appointment.      Any Other Special Instructions Will Be Listed Below (If Applicable).   If you need a refill on your cardiac medications before your next appointment, please call your pharmacy.

## 2023-12-08 ENCOUNTER — Ambulatory Visit (HOSPITAL_COMMUNITY)
Admission: RE | Admit: 2023-12-08 | Discharge: 2023-12-08 | Disposition: A | Discharge status: Home / Self Care | Payer: Self-pay | Source: Ambulatory Visit | Attending: Student in an Organized Health Care Education/Training Program | Admitting: Student in an Organized Health Care Education/Training Program

## 2023-12-08 DIAGNOSIS — T466X5A Adverse effect of antihyperlipidemic and antiarteriosclerotic drugs, initial encounter: Secondary | ICD-10-CM | POA: Insufficient documentation

## 2023-12-08 DIAGNOSIS — E782 Mixed hyperlipidemia: Secondary | ICD-10-CM | POA: Insufficient documentation

## 2023-12-08 DIAGNOSIS — Z789 Other specified health status: Secondary | ICD-10-CM | POA: Insufficient documentation

## 2023-12-08 DIAGNOSIS — M791 Myalgia, unspecified site: Secondary | ICD-10-CM | POA: Insufficient documentation

## 2023-12-10 ENCOUNTER — Ambulatory Visit: Payer: Self-pay | Admitting: Internal Medicine

## 2023-12-10 DIAGNOSIS — E782 Mixed hyperlipidemia: Secondary | ICD-10-CM

## 2023-12-13 MED ORDER — EZETIMIBE 10 MG PO TABS: 10.0000 mg | ORAL_TABLET | Freq: Every day | ORAL | 0 refills | Status: AC

## 2023-12-13 MED ORDER — EZETIMIBE 10 MG PO TABS: 10.0000 mg | ORAL_TABLET | Freq: Every day | ORAL | 0 refills | Status: DC

## 2023-12-13 NOTE — Addendum Note (Signed)
 Addended by: JOSHUA ANDREZ PARAS on: 12/13/2023 08:06 AM   Modules accepted: Orders

## 2023-12-17 ENCOUNTER — Other Ambulatory Visit: Payer: Self-pay | Admitting: Family Medicine

## 2023-12-17 DIAGNOSIS — I10 Essential (primary) hypertension: Secondary | ICD-10-CM

## 2024-01-16 ENCOUNTER — Other Ambulatory Visit

## 2024-01-17 ENCOUNTER — Telehealth (HOSPITAL_COMMUNITY): Payer: Self-pay | Admitting: Internal Medicine

## 2024-01-17 ENCOUNTER — Ambulatory Visit (HOSPITAL_COMMUNITY)

## 2024-01-17 NOTE — Telephone Encounter (Signed)
 01/17/24 -patient cancelled Echocardiogram  cause she states insurance said she needs PA# and the Auth team has worked it and NO PA# needed. Patient will call back to reschedule after she speaks with Secondary School Teacher. I explained to patient that the Auth Team has checked all codes and no prior auth needed BUT patient insist on speaking with insurance before having. Order removed from the echo wq and when patient calls back we will reinstate the order. Thank you.

## 2024-02-08 ENCOUNTER — Ambulatory Visit: Payer: Self-pay | Admitting: Family Medicine

## 2024-02-08 ENCOUNTER — Ambulatory Visit (HOSPITAL_BASED_OUTPATIENT_CLINIC_OR_DEPARTMENT_OTHER)
Admission: RE | Admit: 2024-02-08 | Discharge: 2024-02-08 | Disposition: A | Discharge status: Home / Self Care | Source: Ambulatory Visit | Attending: Family Medicine | Admitting: Family Medicine

## 2024-02-08 DIAGNOSIS — M85852 Other specified disorders of bone density and structure, left thigh: Secondary | ICD-10-CM | POA: Diagnosis present

## 2024-02-08 DIAGNOSIS — E2839 Other primary ovarian failure: Secondary | ICD-10-CM | POA: Diagnosis present

## 2024-02-16 ENCOUNTER — Ambulatory Visit: Payer: Medicare HMO | Admitting: Internal Medicine

## 2024-04-05 ENCOUNTER — Ambulatory Visit: Admitting: Family Medicine

## 2024-04-10 ENCOUNTER — Ambulatory Visit: Admitting: Family Medicine

## 2024-10-31 ENCOUNTER — Ambulatory Visit

## 2024-10-31 ENCOUNTER — Encounter: Admitting: Family Medicine
